# Patient Record
Sex: Female | Born: 1945 | Race: White | Hispanic: No | Smoking: Never smoker
Health system: Southern US, Community
[De-identification: ages and names within clinical notes are randomized; demographics above are authoritative.]

## PROBLEM LIST (undated history)

## (undated) DIAGNOSIS — I10 Essential (primary) hypertension: Secondary | ICD-10-CM

## (undated) DIAGNOSIS — E785 Hyperlipidemia, unspecified: Secondary | ICD-10-CM

## (undated) DIAGNOSIS — M8580 Other specified disorders of bone density and structure, unspecified site: Secondary | ICD-10-CM

## (undated) DIAGNOSIS — K439 Ventral hernia without obstruction or gangrene: Secondary | ICD-10-CM

## (undated) DIAGNOSIS — T7840XA Allergy, unspecified, initial encounter: Secondary | ICD-10-CM

## (undated) HISTORY — DX: Essential (primary) hypertension: I10

## (undated) HISTORY — DX: Ventral hernia without obstruction or gangrene: K43.9

## (undated) HISTORY — DX: Allergy, unspecified, initial encounter: T78.40XA

## (undated) HISTORY — PX: BLADDER SURGERY: SHX569

## (undated) HISTORY — DX: Hyperlipidemia, unspecified: E78.5

## (undated) HISTORY — DX: Other specified disorders of bone density and structure, unspecified site: M85.80

## (undated) HISTORY — PX: HERNIA REPAIR: SHX51

## (undated) HISTORY — PX: ABDOMINAL HYSTERECTOMY: SHX81

---

## 2005-05-06 ENCOUNTER — Ambulatory Visit: Payer: Self-pay

## 2006-05-06 ENCOUNTER — Ambulatory Visit: Payer: Self-pay

## 2013-06-23 DIAGNOSIS — K432 Incisional hernia without obstruction or gangrene: Secondary | ICD-10-CM | POA: Insufficient documentation

## 2014-01-20 ENCOUNTER — Ambulatory Visit: Payer: Self-pay | Admitting: Family Medicine

## 2015-01-25 ENCOUNTER — Ambulatory Visit: Payer: Self-pay | Admitting: Family Medicine

## 2015-02-09 ENCOUNTER — Ambulatory Visit
Admit: 2015-02-09 | Disposition: A | Discharge status: Home / Self Care | Payer: Self-pay | Attending: Family Medicine | Admitting: Family Medicine

## 2015-07-19 ENCOUNTER — Ambulatory Visit (INDEPENDENT_AMBULATORY_CARE_PROVIDER_SITE_OTHER): Payer: Commercial Managed Care - HMO | Admitting: Family Medicine

## 2015-07-19 ENCOUNTER — Encounter: Payer: Self-pay | Admitting: Family Medicine

## 2015-07-19 ENCOUNTER — Other Ambulatory Visit: Payer: Self-pay | Admitting: Family Medicine

## 2015-07-19 VITALS — BP 128/88 | HR 71 | Temp 98.4°F | Resp 16 | Ht 65.0 in | Wt 141.2 lb

## 2015-07-19 DIAGNOSIS — I803 Phlebitis and thrombophlebitis of lower extremities, unspecified: Secondary | ICD-10-CM | POA: Insufficient documentation

## 2015-07-19 DIAGNOSIS — M546 Pain in thoracic spine: Secondary | ICD-10-CM

## 2015-07-19 DIAGNOSIS — Z23 Encounter for immunization: Secondary | ICD-10-CM

## 2015-07-19 DIAGNOSIS — R7303 Prediabetes: Secondary | ICD-10-CM | POA: Insufficient documentation

## 2015-07-19 DIAGNOSIS — Z87448 Personal history of other diseases of urinary system: Secondary | ICD-10-CM | POA: Insufficient documentation

## 2015-07-19 DIAGNOSIS — E7800 Pure hypercholesterolemia, unspecified: Secondary | ICD-10-CM | POA: Insufficient documentation

## 2015-07-19 DIAGNOSIS — R739 Hyperglycemia, unspecified: Secondary | ICD-10-CM

## 2015-07-19 DIAGNOSIS — Z789 Other specified health status: Secondary | ICD-10-CM | POA: Insufficient documentation

## 2015-07-19 DIAGNOSIS — E785 Hyperlipidemia, unspecified: Secondary | ICD-10-CM

## 2015-07-19 DIAGNOSIS — N952 Postmenopausal atrophic vaginitis: Secondary | ICD-10-CM | POA: Insufficient documentation

## 2015-07-19 DIAGNOSIS — R7309 Other abnormal glucose: Secondary | ICD-10-CM

## 2015-07-19 DIAGNOSIS — K439 Ventral hernia without obstruction or gangrene: Secondary | ICD-10-CM | POA: Insufficient documentation

## 2015-07-19 DIAGNOSIS — J302 Other seasonal allergic rhinitis: Secondary | ICD-10-CM | POA: Insufficient documentation

## 2015-07-19 DIAGNOSIS — I82819 Embolism and thrombosis of superficial veins of unspecified lower extremities: Secondary | ICD-10-CM | POA: Insufficient documentation

## 2015-07-19 DIAGNOSIS — Z889 Allergy status to unspecified drugs, medicaments and biological substances status: Secondary | ICD-10-CM

## 2015-07-19 DIAGNOSIS — I10 Essential (primary) hypertension: Secondary | ICD-10-CM | POA: Diagnosis not present

## 2015-07-19 DIAGNOSIS — R928 Other abnormal and inconclusive findings on diagnostic imaging of breast: Secondary | ICD-10-CM | POA: Diagnosis not present

## 2015-07-19 DIAGNOSIS — Z87718 Personal history of other specified (corrected) congenital malformations of genitourinary system: Secondary | ICD-10-CM | POA: Insufficient documentation

## 2015-07-19 HISTORY — DX: Hyperglycemia, unspecified: R73.9

## 2015-07-19 HISTORY — DX: Ventral hernia without obstruction or gangrene: K43.9

## 2015-07-19 MED ORDER — CYCLOBENZAPRINE HCL 5 MG PO TABS: 5.0000 mg | ORAL_TABLET | Freq: Every evening | ORAL | Status: DC | PRN

## 2015-07-19 NOTE — Addendum Note (Signed)
Addended by: Edwena Felty on: 07/19/2015 02:59 PM   Modules accepted: Orders, SmartSet

## 2015-07-19 NOTE — Patient Instructions (Signed)
Fat and Cholesterol Control Diet Fat and cholesterol levels in your blood and organs are influenced by your diet. High levels of fat and cholesterol may lead to diseases of the heart, small and large blood vessels, gallbladder, liver, and pancreas. CONTROLLING FAT AND CHOLESTEROL WITH DIET Although exercise and lifestyle factors are important, your diet is key. That is because certain foods are known to raise cholesterol and others to lower it. The goal is to balance foods for their effect on cholesterol and more importantly, to replace saturated and trans fat with other types of fat, such as monounsaturated fat, polyunsaturated fat, and omega-3 fatty acids. On average, a person should consume no more than 15 to 17 g of saturated fat daily. Saturated and trans fats are considered "bad" fats, and they will raise LDL cholesterol. Saturated fats are primarily found in animal products such as meats, butter, and cream. However, that does not mean you need to give up all your favorite foods. Today, there are good tasting, low-fat, low-cholesterol substitutes for most of the things you like to eat. Choose low-fat or nonfat alternatives. Choose round or loin cuts of red meat. These types of cuts are lowest in fat and cholesterol. Chicken (without the skin), fish, veal, and ground turkey breast are great choices. Eliminate fatty meats, such as hot dogs and salami. Even shellfish have little or no saturated fat. Have a 3 oz (85 g) portion when you eat lean meat, poultry, or fish. Trans fats are also called "partially hydrogenated oils." They are oils that have been scientifically manipulated so that they are solid at room temperature resulting in a longer shelf life and improved taste and texture of foods in which they are added. Trans fats are found in stick margarine, some tub margarines, cookies, crackers, and baked goods.  When baking and cooking, oils are a great substitute for butter. The monounsaturated oils are  especially beneficial since it is believed they lower LDL and raise HDL. The oils you should avoid entirely are saturated tropical oils, such as coconut and palm.  Remember to eat a lot from food groups that are naturally free of saturated and trans fat, including fish, fruit, vegetables, beans, grains (barley, rice, couscous, bulgur wheat), and pasta (without cream sauces).  IDENTIFYING FOODS THAT LOWER FAT AND CHOLESTEROL  Soluble fiber may lower your cholesterol. This type of fiber is found in fruits such as apples, vegetables such as broccoli, potatoes, and carrots, legumes such as beans, peas, and lentils, and grains such as barley. Foods fortified with plant sterols (phytosterol) may also lower cholesterol. You should eat at least 2 g per day of these foods for a cholesterol lowering effect.  Read package labels to identify low-saturated fats, trans fat free, and low-fat foods at the supermarket. Select cheeses that have only 2 to 3 g saturated fat per ounce. Use a heart-healthy tub margarine that is free of trans fats or partially hydrogenated oil. When buying baked goods (cookies, crackers), avoid partially hydrogenated oils. Breads and muffins should be made from whole grains (whole-wheat or whole oat flour, instead of "flour" or "enriched flour"). Buy non-creamy canned soups with reduced salt and no added fats.  FOOD PREPARATION TECHNIQUES  Never deep-fry. If you must fry, either stir-fry, which uses very little fat, or use non-stick cooking sprays. When possible, broil, bake, or roast meats, and steam vegetables. Instead of putting butter or margarine on vegetables, use lemon and herbs, applesauce, and cinnamon (for squash and sweet potatoes). Use nonfat   yogurt, salsa, and low-fat dressings for salads.  LOW-SATURATED FAT / LOW-FAT FOOD SUBSTITUTES Meats / Saturated Fat (g)  Avoid: Steak, marbled (3 oz/85 g) / 11 g  Choose: Steak, lean (3 oz/85 g) / 4 g  Avoid: Hamburger (3 oz/85 g) / 7  g  Choose: Hamburger, lean (3 oz/85 g) / 5 g  Avoid: Ham (3 oz/85 g) / 6 g  Choose: Ham, lean cut (3 oz/85 g) / 2.4 g  Avoid: Chicken, with skin, dark meat (3 oz/85 g) / 4 g  Choose: Chicken, skin removed, dark meat (3 oz/85 g) / 2 g  Avoid: Chicken, with skin, light meat (3 oz/85 g) / 2.5 g  Choose: Chicken, skin removed, light meat (3 oz/85 g) / 1 g Dairy / Saturated Fat (g)  Avoid: Whole milk (1 cup) / 5 g  Choose: Low-fat milk, 2% (1 cup) / 3 g  Choose: Low-fat milk, 1% (1 cup) / 1.5 g  Choose: Skim milk (1 cup) / 0.3 g  Avoid: Hard cheese (1 oz/28 g) / 6 g  Choose: Skim milk cheese (1 oz/28 g) / 2 to 3 g  Avoid: Cottage cheese, 4% fat (1 cup) / 6.5 g  Choose: Low-fat cottage cheese, 1% fat (1 cup) / 1.5 g  Avoid: Ice cream (1 cup) / 9 g  Choose: Sherbet (1 cup) / 2.5 g  Choose: Nonfat frozen yogurt (1 cup) / 0.3 g  Choose: Frozen fruit bar / trace  Avoid: Whipped cream (1 tbs) / 3.5 g  Choose: Nondairy whipped topping (1 tbs) / 1 g Condiments / Saturated Fat (g)  Avoid: Mayonnaise (1 tbs) / 2 g  Choose: Low-fat mayonnaise (1 tbs) / 1 g  Avoid: Butter (1 tbs) / 7 g  Choose: Extra light margarine (1 tbs) / 1 g  Avoid: Coconut oil (1 tbs) / 11.8 g  Choose: Olive oil (1 tbs) / 1.8 g  Choose: Corn oil (1 tbs) / 1.7 g  Choose: Safflower oil (1 tbs) / 1.2 g  Choose: Sunflower oil (1 tbs) / 1.4 g  Choose: Soybean oil (1 tbs) / 2.4 g  Choose: Canola oil (1 tbs) / 1 g Document Released: 10/27/2005 Document Revised: 02/21/2013 Document Reviewed: 01/25/2014 [employer] Patient Information 2015 [employer]. This information is not intended to replace advice given to you by your health care provider. Make sure you discuss any questions you have with your health care provider.  

## 2015-07-19 NOTE — Addendum Note (Signed)
Addended by: Edwena Felty on: 07/19/2015 03:00 PM   Modules accepted: Kipp Brood

## 2015-07-19 NOTE — Progress Notes (Addendum)
Name: Laura Deleon   MRN: [id number]    DOB: 1946/01/28   Date:07/19/2015       Progress Note  Subjective  Chief Complaint  Chief Complaint  Patient presents with  . Hypertension  . Back Pain    patient stated that she has been having some issues with the left side of her upper back near the shoulder blade    HPI  Laura Deleon is a 69 year old female here for routine follow up of chronic medical conditions. Patient here for follow-up of elevated blood pressure. She is not exercising and is adherent to low salt diet.  Blood pressure is well controlled at home. Cardiac symptoms none. Patient denies chest pain, chest pressure/discomfort, claudication, exertional chest pressure/discomfort, irregular heart beat, lower extremity edema and palpitations.  Cardiovascular risk factors: advanced age (older than 58 for men, 76 for women), dyslipidemia, hypertension and sedentary lifestyle. She reports stopping her statin medication pravastatin 10 mg as it was causing her to feel sick with myalgia. She has tried several statins and they all don't agree with her. Use of agents associated with hypertension: estrogens. History of target organ damage: none. Patient complains of arthralgias for which has been present for several weeks. Pain is located in left side of upper back near shoulder blade, is described as aching and dull, and is intermittent .  Associated symptoms include: none.  The patient has tried nothing for pain relief.  Related to injury:   No.      Patient Active Problem List   Diagnosis Date Noted  . Abdominal wall hernia 07/19/2015  . Allergic rhinitis, seasonal 07/19/2015  . History of urinary anomaly 07/19/2015  . Superficial thrombosis of leg 07/19/2015  . Calcium blood increased 07/19/2015  . Blood glucose elevated 07/19/2015  . BP (high blood pressure) 07/19/2015  . Phlebitis of leg 07/19/2015  . Borderline diabetes 07/19/2015  . Hypercholesterolemia without  hypertriglyceridemia 07/19/2015  . 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  . Recurrent ventral incisional hernia 06/23/2013    Social History  Substance Use Topics  . Smoking status: Never Smoker   . Smokeless tobacco: Not on file  . Alcohol Use: No     Current outpatient prescriptions:  .  aspirin EC 81 MG tablet, Take 81 mg by mouth daily., Disp: , Rfl:  .  atenolol (TENORMIN) 50 MG tablet, Take 1 tablet by mouth daily., Disp: , Rfl: 0 .  fluticasone (FLONASE) 50 MCG/ACT nasal spray, Place 2 sprays into the nose daily., Disp: , Rfl:  .  loratadine (CLARITIN) 10 MG tablet, Take 1 tablet by mouth daily., Disp: , Rfl: 2 .  Multiple Vitamin (MULTI-VITAMINS) TABS, Take 1 tablet by mouth daily., Disp: , Rfl:  .  PREMARIN vaginal cream, Apply 1 g topically 2 (two) times a week. vaginally, Disp: , Rfl: 2 .  Red Yeast Rice Extract 600 MG CAPS, Take 600 mg by mouth daily., Disp: , Rfl:   No past surgical history on file.  No family history on file.  Allergies  Allergen Reactions  . Sulfa Antibiotics Shortness Of Breath     Review of Systems  CONSTITUTIONAL: No significant weight changes, fever, chills, weakness or fatigue.  HEENT:  - Eyes: No visual changes.  - Ears: No auditory changes. No pain.  - Nose: No sneezing, congestion, runny nose. - Throat: No sore throat. No changes in swallowing. SKIN: No rash or itching.  CARDIOVASCULAR: No chest pain, chest pressure or chest discomfort. No palpitations or  edema.  RESPIRATORY: No shortness of breath, cough or sputum.  GASTROINTESTINAL: No anorexia, nausea, vomiting. No changes in bowel habits. No abdominal pain or blood.  GENITOURINARY: No dysuria. No frequency. No discharge.  NEUROLOGICAL: No headache, dizziness, syncope, paralysis, ataxia, numbness or tingling in the extremities. No memory changes. No change in bowel or bladder control.  MUSCULOSKELETAL: No joint pain. Back muscle pain. HEMATOLOGIC: No anemia, bleeding or bruising.   LYMPHATICS: No enlarged lymph nodes.  PSYCHIATRIC: No change in mood. No change in sleep pattern.  ENDOCRINOLOGIC: No reports of sweating, cold or heat intolerance. No polyuria or polydipsia.     Objective  BP 128/88 mmHg  Pulse 71  Temp(Src) 98.4 F (36.9 C) (Oral)  Resp 16  Ht 5\' 5"  (1.651 m)  Wt 141 lb 3.2 oz (64.048 kg)  BMI 23.50 kg/m2  SpO2 95% Body mass index is 23.5 kg/(m^2).  Physical Exam  Constitutional: Patient appears well-developed and well-nourished. In no distress.  HEENT:  - Head: Normocephalic and atraumatic.  - Ears: Bilateral TMs gray, no erythema or effusion - Nose: Nasal mucosa moist - Mouth/Throat: Oropharynx is clear and moist. No tonsillar hypertrophy or erythema. No post nasal drainage.  - Eyes: Conjunctivae clear, EOM movements normal. PERRLA. No scleral icterus.  Neck: Normal range of motion. Neck supple. No JVD present. No thyromegaly present.  Cardiovascular: Normal rate, regular rhythm and normal heart sounds.  No murmur heard.  Pulmonary/Chest: Effort normal and breath sounds normal. No respiratory distress. Musculoskeletal: Normal range of motion bilateral UE and LE, no joint effusions. Trapezius muscle tension on the left.  Peripheral vascular: Bilateral LE no edema. Neurological: CN II-XII grossly intact with no focal deficits. Alert and oriented to person, place, and time. Coordination, balance, strength, speech and gait are normal.  Skin: Skin is warm and dry. No rash noted. No erythema.  Psychiatric: Patient has a normal mood and affect. Behavior is normal in office today. Judgment and thought content normal in office today.   Assessment & Plan  1. Left-sided thoracic back pain Home exercises instructed.  - cyclobenzaprine (FLEXERIL) 5 MG tablet; Take 1 tablet (5 mg total) by mouth at bedtime as needed for muscle spasms.  Dispense: 30 tablet; Refill: 1  2. Hypertension goal BP (blood pressure) < 140/90 Clinically stable findings  based on clinical exam and on review of any pertinent results. Recommended to patient that they continue their current regimen with regular follow ups.  - CBC with Differential/Platelet - Comprehensive metabolic panel  3. Borderline diabetes Insurance will no longer pay for HBA1C, will monitor fasting glucose.  4. Hyperlipidemia LDL goal <100 Self discontinued statin. Intolerant to statins. Lifestyle changes, fish oil and red yeast rice.  - Lipid panel  5. Need for immunization against influenza  - Flu Vaccine   6. Statin intolerance   7. Abnormal mammogram of left breast LEFT BREAST will need diagnostic mammogram with magnified views on or after August 11, 2015

## 2015-07-21 LAB — CBC WITH DIFFERENTIAL/PLATELET
BASOS ABS: 0 10*3/uL (ref 0.0–0.2)
Basos: 1 %
EOS (ABSOLUTE): 0.1 10*3/uL (ref 0.0–0.4)
Eos: 2 %
HEMOGLOBIN: 15 g/dL (ref 11.1–15.9)
Hematocrit: 44.4 % (ref 34.0–46.6)
Immature Grans (Abs): 0 10*3/uL (ref 0.0–0.1)
Immature Granulocytes: 0 %
LYMPHS ABS: 2.8 10*3/uL (ref 0.7–3.1)
Lymphs: 36 %
MCH: 29.6 pg (ref 26.6–33.0)
MCHC: 33.8 g/dL (ref 31.5–35.7)
MCV: 88 fL (ref 79–97)
MONOCYTES: 8 %
Monocytes Absolute: 0.6 10*3/uL (ref 0.1–0.9)
Neutrophils Absolute: 4.1 10*3/uL (ref 1.4–7.0)
Neutrophils: 53 %
Platelets: 216 10*3/uL (ref 150–379)
RBC: 5.07 x10E6/uL (ref 3.77–5.28)
RDW: 13.6 % (ref 12.3–15.4)
WBC: 7.7 10*3/uL (ref 3.4–10.8)

## 2015-07-21 LAB — COMPREHENSIVE METABOLIC PANEL
ALK PHOS: 68 IU/L (ref 39–117)
ALT: 37 IU/L — AB (ref 0–32)
AST: 32 IU/L (ref 0–40)
Albumin/Globulin Ratio: 1.4 (ref 1.1–2.5)
Albumin: 4.2 g/dL (ref 3.6–4.8)
BUN/Creatinine Ratio: 13 (ref 11–26)
BUN: 9 mg/dL (ref 8–27)
Bilirubin Total: 0.7 mg/dL (ref 0.0–1.2)
CO2: 27 mmol/L (ref 18–29)
CREATININE: 0.72 mg/dL (ref 0.57–1.00)
Calcium: 9.7 mg/dL (ref 8.7–10.3)
Chloride: 100 mmol/L (ref 97–108)
GFR calc Af Amer: 100 mL/min/{1.73_m2} (ref >59)
GFR calc non Af Amer: 87 mL/min/{1.73_m2} (ref >59)
GLUCOSE: 92 mg/dL (ref 65–99)
Globulin, Total: 2.9 g/dL (ref 1.5–4.5)
Potassium: 4.5 mmol/L (ref 3.5–5.2)
Sodium: 141 mmol/L (ref 134–144)
Total Protein: 7.1 g/dL (ref 6.0–8.5)

## 2015-07-21 LAB — LIPID PANEL
CHOL/HDL RATIO: 5 ratio — AB (ref 0.0–4.4)
Cholesterol, Total: 245 mg/dL — ABNORMAL HIGH (ref 100–199)
HDL: 49 mg/dL (ref >39)
LDL Calculated: 167 mg/dL — ABNORMAL HIGH (ref 0–99)
TRIGLYCERIDES: 143 mg/dL (ref 0–149)
VLDL CHOLESTEROL CAL: 29 mg/dL (ref 5–40)

## 2015-08-13 ENCOUNTER — Telehealth: Payer: Self-pay | Admitting: Family Medicine

## 2015-08-13 ENCOUNTER — Other Ambulatory Visit: Payer: Self-pay | Admitting: Family Medicine

## 2015-08-13 ENCOUNTER — Telehealth: Payer: Self-pay

## 2015-08-13 DIAGNOSIS — R928 Other abnormal and inconclusive findings on diagnostic imaging of breast: Secondary | ICD-10-CM

## 2015-08-13 NOTE — Telephone Encounter (Signed)
Tried to contact this patient to inform her that additional testing is needed and that she should call [facility] at [phone] to scheduled an appt, but there was no answer. A message was left informing her that additional testing was needed and to wither give [place] a call or [facility].

## 2015-08-13 NOTE — Telephone Encounter (Signed)
Patient returned my call from earlier and after she verified her date of birth, Dr. Debby Freiberg message regarding her abnormal mammogram was reviewed. Patient was given [facility]'s number and was told to schedule her a f/u appt for additional testing.   Also while on the phone results from blood work was reviewed and a copy was mailed to this patient's home.

## 2015-08-13 NOTE — Telephone Encounter (Signed)
Please let the patient know that I have ordered additional detailed mammogram studies of her Left breast as recommended by her abnormal mammogram results from 02/2015. Please provide her with [facility] number to call and schedule.

## 2015-08-22 ENCOUNTER — Other Ambulatory Visit: Payer: Self-pay | Admitting: Family Medicine

## 2015-09-05 ENCOUNTER — Telehealth: Payer: Self-pay | Admitting: Family Medicine

## 2015-09-05 NOTE — Telephone Encounter (Signed)
Patient was already reminded that additional breast imaging ordered on 08/13/15 however I have not received results to this day therefore I am concerned she has not made appointment to proceed with recommended repeat imaging.  Please speak with patient and maybe make appointment for her with breast center if she allows us to help get this testing done.

## 2015-09-06 NOTE — Telephone Encounter (Signed)
Tried to contact this patient to review Dr. Debby FreibergSundaram's message, but there was no answer. A message was left for this patient to give us a call when she got the chance.

## 2015-09-26 ENCOUNTER — Other Ambulatory Visit: Payer: Self-pay | Admitting: Family Medicine

## 2015-10-02 ENCOUNTER — Other Ambulatory Visit: Payer: Self-pay | Admitting: Family Medicine

## 2015-10-02 ENCOUNTER — Ambulatory Visit
Admission: RE | Admit: 2015-10-02 | Discharge: 2015-10-02 | Disposition: A | Discharge status: Home / Self Care | Payer: Commercial Managed Care - HMO | Source: Ambulatory Visit | Attending: Family Medicine | Admitting: Family Medicine

## 2015-10-02 DIAGNOSIS — R928 Other abnormal and inconclusive findings on diagnostic imaging of breast: Secondary | ICD-10-CM | POA: Diagnosis present

## 2015-10-02 DIAGNOSIS — R921 Mammographic calcification found on diagnostic imaging of breast: Secondary | ICD-10-CM | POA: Diagnosis not present

## 2015-10-12 ENCOUNTER — Encounter: Payer: Self-pay | Admitting: Family Medicine

## 2015-10-12 ENCOUNTER — Ambulatory Visit
Admission: RE | Admit: 2015-10-12 | Discharge: 2015-10-12 | Disposition: A | Discharge status: Home / Self Care | Payer: Commercial Managed Care - HMO | Source: Ambulatory Visit | Attending: Family Medicine | Admitting: Family Medicine

## 2015-10-12 ENCOUNTER — Ambulatory Visit (INDEPENDENT_AMBULATORY_CARE_PROVIDER_SITE_OTHER): Payer: Commercial Managed Care - HMO | Admitting: Family Medicine

## 2015-10-12 VITALS — BP 132/80 | HR 64 | Temp 98.4°F | Resp 12 | Ht 65.0 in | Wt 141.0 lb

## 2015-10-12 DIAGNOSIS — R079 Chest pain, unspecified: Secondary | ICD-10-CM | POA: Diagnosis present

## 2015-10-12 DIAGNOSIS — R0789 Other chest pain: Secondary | ICD-10-CM | POA: Insufficient documentation

## 2015-10-12 NOTE — Progress Notes (Signed)
Name: Laura MoseLinda W Deleon   MRN: [id number]    DOB: 30-Jan-1946   Date:10/12/2015       Progress Note  Subjective  Chief Complaint  Chief Complaint  Patient presents with  . Chest Pain    HPI  Laura BridegroomLinda Eickhoff is a 69 year old female with left sided chest wall pain since October of 2016. Initially thought to be breast pain. Had screening and diagnostic mammogram, stable findings. Pain described as achy/sharp and comes and goes through out the day time only. Initially thought to be due to some yard work done early in October. Using muscle relaxer for musculoskeletal pain which helps a bit but symptoms are still persistent. Does not wake her up at night. Not associated with SOB, cough, unwanted weight loss, fevers. Reports being very stressed out (husband ill and difficult personality, other family stuff). Pain not worse with deep breathing.   Past Medical History  Diagnosis Date  . Allergy   . Hyperlipidemia     Patient Active Problem List   Diagnosis Date Noted  . Pain in the chest 10/12/2015  . Abdominal wall hernia 07/19/2015  . Allergic rhinitis, seasonal 07/19/2015  . History of urinary anomaly 07/19/2015  . Superficial thrombosis of leg 07/19/2015  . Calcium blood increased 07/19/2015  . Blood glucose elevated 07/19/2015  . Hypertension goal BP (blood pressure) < 140/90 07/19/2015  . Phlebitis of leg 07/19/2015  . Borderline diabetes 07/19/2015  . Hypercholesterolemia without hypertriglyceridemia 07/19/2015  . 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  . Left-sided thoracic back pain 07/19/2015  . Hyperlipidemia LDL goal <100 07/19/2015  . Need for immunization against influenza 07/19/2015  . Abnormal mammogram of left breast 07/19/2015  . Statin intolerance 07/19/2015  . Recurrent ventral incisional hernia 06/23/2013    Social History  Substance Use Topics  . Smoking status: Never Smoker   . Smokeless tobacco: Not on file  . Alcohol Use: No     Current outpatient prescriptions:  .   aspirin EC 81 MG tablet, Take 81 mg by mouth daily., Disp: , Rfl:  .  atenolol (TENORMIN) 50 MG tablet, TAKE 1 TABLET BY MOUTH DAILY., Disp: 90 tablet, Rfl: 3 .  cyclobenzaprine (FLEXERIL) 5 MG tablet, Take 1 tablet (5 mg total) by mouth at bedtime as needed for muscle spasms., Disp: 30 tablet, Rfl: 1 .  fluticasone (FLONASE) 50 MCG/ACT nasal spray, Place 2 sprays into the nose daily., Disp: , Rfl:  .  loratadine (CLARITIN) 10 MG tablet, TAKE 1 TABLET BY MOUTH EVERY DAY, Disp: 90 tablet, Rfl: 3 .  Multiple Vitamin (MULTI-VITAMINS) TABS, Take 1 tablet by mouth daily., Disp: , Rfl:  .  PREMARIN vaginal cream, Apply 1 g topically 2 (two) times a week. vaginally, Disp: , Rfl: 2 .  Red Yeast Rice Extract 600 MG CAPS, Take 600 mg by mouth daily., Disp: , Rfl:   Past Surgical History  Procedure Laterality Date  . Abdominal hysterectomy      Family History  Problem Relation Age of Onset  . Breast cancer Neg Hx   . Hyperlipidemia Mother   . Hypertension Mother   . Hyperlipidemia Father     Allergies  Allergen Reactions  . Sulfa Antibiotics Shortness Of Breath     Review of Systems  CONSTITUTIONAL: No significant weight changes, fever, chills, weakness or fatigue.  CARDIOVASCULAR: Yes chest pain. No chest pressure or chest discomfort. No palpitations or edema.  RESPIRATORY: No shortness of breath, cough or sputum.  NEUROLOGICAL: No headache, dizziness,  syncope, paralysis, ataxia, numbness or tingling in the extremities. No memory changes. No change in bowel or bladder control.  MUSCULOSKELETAL: Chronic joint pain. No muscle pain. PSYCHIATRIC: No change in mood. No change in sleep pattern.  ENDOCRINOLOGIC: No reports of sweating, cold or heat intolerance. No polyuria or polydipsia.     Objective  BP 132/80 mmHg  Pulse 64  Temp(Src) 98.4 F (36.9 C) (Oral)  Resp 12  Ht  (1.651 m)  Wt 141 lb (63.957 kg)  BMI 23.46 kg/m2  SpO2 94% Body mass index is 23.46  kg/(m^2).  Physical Exam  Constitutional: Patient appears well-developed and well-nourished. In no distress.  Neck: Normal range of motion. Neck supple. No JVD present. No thyromegaly present.  Cardiovascular: Normal rate, regular rhythm and normal heart sounds.  No murmur heard. Left of the sternal border reproducible pain on palpation.  Pulmonary/Chest: Effort normal and breath sounds normal. No respiratory distress. Musculoskeletal: Normal range of motion bilateral UE and LE, no joint effusions. Peripheral vascular: Bilateral LE no edema. Neurological: CN II-XII grossly intact with no focal deficits. Alert and oriented to person, place, and time. Coordination, balance, strength, speech and gait are normal.  Skin: Skin is warm and dry. No rash noted. No erythema.  Psychiatric: Patient has a normal mood and affect. Behavior is normal in office today. Judgment and thought content normal in office today.   Recent Results (from the past 2160 hour(s))  CBC with Differential/Platelet     Status: None   Collection Time: 07/20/15 12:57 PM  Result Value Ref Range   WBC 7.7 3.4 - 10.8 x10E3/uL   RBC 5.07 3.77 - 5.28 x10E6/uL   Hemoglobin 15.0 11.1 - 15.9 g/dL   Hematocrit 40.9 81.1 - 46.6 %   MCV 88 79 - 97 fL   MCH 29.6 26.6 - 33.0 pg   MCHC 33.8 31.5 - 35.7 g/dL   RDW 91.4 78.2 - 95.6 %   Platelets 216 150 - 379 x10E3/uL   Neutrophils 53 %   Lymphs 36 %   Monocytes 8 %   Eos 2 %   Basos 1 %   Neutrophils Absolute 4.1 1.4 - 7.0 x10E3/uL   Lymphocytes Absolute 2.8 0.7 - 3.1 x10E3/uL   Monocytes Absolute 0.6 0.1 - 0.9 x10E3/uL   EOS (ABSOLUTE) 0.1 0.0 - 0.4 x10E3/uL   Basophils Absolute 0.0 0.0 - 0.2 x10E3/uL   Immature Granulocytes 0 %   Immature Grans (Abs) 0.0 0.0 - 0.1 x10E3/uL  Comprehensive metabolic panel     Status: Abnormal   Collection Time: 07/20/15 12:57 PM  Result Value Ref Range   Glucose 92 65 - 99 mg/dL   BUN 9 8 - 27 mg/dL   Creatinine, Ser 2.13 0.57 - 1.00 mg/dL    GFR calc non Af Amer 87 >59 mL/min/1.73   GFR calc Af Amer 100 >59 mL/min/1.73   BUN/Creatinine Ratio 13 11 - 26   Sodium 141 134 - 144 mmol/L   Potassium 4.5 3.5 - 5.2 mmol/L   Chloride 100 97 - 108 mmol/L   CO2 27 18 - 29 mmol/L   Calcium 9.7 8.7 - 10.3 mg/dL   Total Protein 7.1 6.0 - 8.5 g/dL   Albumin 4.2 3.6 - 4.8 g/dL   Globulin, Total 2.9 1.5 - 4.5 g/dL   Albumin/Globulin Ratio 1.4 1.1 - 2.5   Bilirubin Total 0.7 0.0 - 1.2 mg/dL   Alkaline Phosphatase 68 39 - 117 IU/L   AST 32 0 -  40 IU/L   ALT 37 (H) 0 - 32 IU/L  Lipid panel     Status: Abnormal   Collection Time: 07/20/15 12:57 PM  Result Value Ref Range   Cholesterol, Total 245 (H) 100 - 199 mg/dL   Triglycerides 161 0 - 149 mg/dL   HDL 49 >09 mg/dL    Comment: According to ATP-III Guidelines, HDL-C >59 mg/dL is considered a negative risk factor for CHD.    VLDL Cholesterol Cal 29 5 - 40 mg/dL   LDL Calculated 604 (H) 0 - 99 mg/dL   Chol/HDL Ratio 5.0 (H) 0.0 - 4.4 ratio units    Comment:                                   T. Chol/HDL Ratio                                             Men  Women                               1/2 Avg.Risk  3.4    3.3                                   Avg.Risk  5.0    4.4                                2X Avg.Risk  9.6    7.1                                3X Avg.Risk 23.4   11.0      Assessment & Plan  1. Chest pain, unspecified chest pain type Atypical chest wall pain although I can not rule out possible cardiac etiology given her HLD and intolerance to statin therapy. EKG no acute findings or ST segment changes. Will get troponin, D-dimer (PE less likely) and CXR. Plan on Cardiology consultation for stress testing after I review results of today's orders.  - D-Dimer, Quantitative - Troponin I - DG Chest 2 View; Future - EKG 12-Lead

## 2015-10-12 NOTE — Patient Instructions (Signed)
1) Lab work 2) CXR

## 2015-10-13 LAB — TROPONIN I

## 2015-10-13 LAB — D-DIMER, QUANTITATIVE: D-DIMER: 0.45 mg/L FEU (ref 0.00–0.49)

## 2015-10-15 ENCOUNTER — Other Ambulatory Visit: Payer: Self-pay | Admitting: Family Medicine

## 2015-10-15 DIAGNOSIS — R0789 Other chest pain: Secondary | ICD-10-CM

## 2015-11-13 DIAGNOSIS — R011 Cardiac murmur, unspecified: Secondary | ICD-10-CM | POA: Diagnosis not present

## 2015-11-13 DIAGNOSIS — R0602 Shortness of breath: Secondary | ICD-10-CM | POA: Diagnosis not present

## 2015-11-13 DIAGNOSIS — R5383 Other fatigue: Secondary | ICD-10-CM | POA: Diagnosis not present

## 2015-11-13 DIAGNOSIS — I208 Other forms of angina pectoris: Secondary | ICD-10-CM | POA: Diagnosis not present

## 2015-11-13 DIAGNOSIS — R7303 Prediabetes: Secondary | ICD-10-CM | POA: Diagnosis not present

## 2015-11-13 DIAGNOSIS — I10 Essential (primary) hypertension: Secondary | ICD-10-CM | POA: Diagnosis not present

## 2015-11-18 ENCOUNTER — Other Ambulatory Visit: Payer: Self-pay | Admitting: Family Medicine

## 2015-12-20 ENCOUNTER — Telehealth: Payer: Self-pay | Admitting: Family Medicine

## 2015-12-20 NOTE — Telephone Encounter (Signed)
Pt would like a order put in for her to get a mammogram.

## 2016-01-05 ENCOUNTER — Other Ambulatory Visit: Payer: Self-pay | Admitting: Family Medicine

## 2016-01-08 ENCOUNTER — Encounter: Payer: Self-pay | Admitting: Family Medicine

## 2016-01-08 ENCOUNTER — Ambulatory Visit (INDEPENDENT_AMBULATORY_CARE_PROVIDER_SITE_OTHER): Payer: PPO | Admitting: Family Medicine

## 2016-01-08 VITALS — BP 132/78 | HR 76 | Temp 98.8°F | Resp 16 | Ht 65.0 in | Wt 143.3 lb

## 2016-01-08 DIAGNOSIS — E785 Hyperlipidemia, unspecified: Secondary | ICD-10-CM

## 2016-01-08 DIAGNOSIS — K439 Ventral hernia without obstruction or gangrene: Secondary | ICD-10-CM | POA: Diagnosis not present

## 2016-01-08 DIAGNOSIS — R928 Other abnormal and inconclusive findings on diagnostic imaging of breast: Secondary | ICD-10-CM

## 2016-01-08 DIAGNOSIS — Z889 Allergy status to unspecified drugs, medicaments and biological substances status: Secondary | ICD-10-CM

## 2016-01-08 DIAGNOSIS — Z789 Other specified health status: Secondary | ICD-10-CM

## 2016-01-08 DIAGNOSIS — I10 Essential (primary) hypertension: Secondary | ICD-10-CM | POA: Diagnosis not present

## 2016-01-08 DIAGNOSIS — N952 Postmenopausal atrophic vaginitis: Secondary | ICD-10-CM | POA: Diagnosis not present

## 2016-01-08 MED ORDER — ESTROGENS, CONJUGATED 0.625 MG/GM VA CREA: TOPICAL_CREAM | VAGINAL | Status: DC

## 2016-01-08 NOTE — Progress Notes (Signed)
Name: Laura Deleon   MRN: [id number]    DOB: April 12, 1946   Date:01/08/2016       Progress Note  Subjective  Chief Complaint  Chief Complaint  Patient presents with  . Medication Refill  . Menopause    Patient need refill on premarin.  Marland Kitchen Referral    mammagram    HPI  Laura Deleon is a 70 year old female here for routine follow up of chronic medical conditions. Patient here for follow-up of elevated blood pressure. She is not exercising and is adherent to low salt diet. Blood pressure is well controlled at home. Cardiac symptoms none. Patient denies chest pain, chest pressure/discomfort, claudication, exertional chest pressure/discomfort, irregular heart beat, lower extremity edema and palpitations. Cardiovascular risk factors: advanced age (older than 57 for men, 73 for women), dyslipidemia, hypertension and sedentary lifestyle. She reports stopping her statin medication pravastatin 10 mg as it was causing her to feel sick with myalgia. She has tried several statins and they all don't agree with her. Use of agents associated with hypertension: estrogens. History of target organ damage: none.   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 Over the years she notes slowly increasing bulging and distention of the same area w/o chronic or acute pain. Would like to re consult with [facility] where she had the original repair.   Post menopausal symptoms and vaginal dryness well controled on premarin cream 2x/week and she is requesting refill today. Otherwise needs diagnostic mammogram and [place] ordered to follow previous abnormal findings.   Past Medical History  Diagnosis Date  . Allergy   . Hyperlipidemia   . Hypertension   . Hernia of anterior abdominal wall     Patient Active Problem List   Diagnosis Date Noted  . Atypical chest pain 10/12/2015  . Abdominal wall hernia 07/19/2015  . Allergic rhinitis, seasonal 07/19/2015  . History of urinary  anomaly 07/19/2015  . Superficial thrombosis of leg 07/19/2015  . Calcium blood increased 07/19/2015  . Blood glucose elevated 07/19/2015  . Hypertension goal BP (blood pressure) < 140/90 07/19/2015  . Phlebitis of leg 07/19/2015  . Borderline diabetes 07/19/2015  . Hypercholesterolemia without hypertriglyceridemia 07/19/2015  . 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  . Left-sided thoracic back pain 07/19/2015  . Hyperlipidemia LDL goal <100 07/19/2015  . Need for immunization against influenza 07/19/2015  . Abnormal mammogram of left breast 07/19/2015  . Statin intolerance 07/19/2015  . Recurrent ventral incisional hernia 06/23/2013    Social History  Substance Use Topics  . Smoking status: Never Smoker   . Smokeless tobacco: Not on file  . Alcohol Use: No     Current outpatient prescriptions:  .  aspirin EC 81 MG tablet, Take 81 mg by mouth daily., Disp: , Rfl:  .  atenolol (TENORMIN) 50 MG tablet, TAKE 1 TABLET BY MOUTH DAILY., Disp: 90 tablet, Rfl: 3 .  cyclobenzaprine (FLEXERIL) 5 MG tablet, TAKE 1 TABLET(5 MG) BY MOUTH AT BEDTIME AS NEEDED FOR MUSCLE SPASMS, Disp: 30 tablet, Rfl: 3 .  fluticasone (FLONASE) 50 MCG/ACT nasal spray, Place 2 sprays into the nose daily., Disp: , Rfl:  .  loratadine (CLARITIN) 10 MG tablet, TAKE 1 TABLET BY MOUTH EVERY DAY, Disp: 90 tablet, Rfl: 3 .  Multiple Vitamin (MULTI-VITAMINS) TABS, Take 1 tablet by mouth daily., Disp: , Rfl:  .  PREMARIN vaginal cream, APPLY 1 GRAM TO AFFECTED VAGINAL AREA TWICE A WEEK AS DIRECTED, Disp: 30 g,  Rfl: 0 .  Red Yeast Rice Extract 600 MG CAPS, Take 600 mg by mouth daily., Disp: , Rfl:   Past Surgical History  Procedure Laterality Date  . Abdominal hysterectomy      Family History  Problem Relation Age of Onset  . Breast cancer Neg Hx   . Hyperlipidemia Mother   . Hypertension Mother   . Hyperlipidemia Father     Allergies  Allergen Reactions  . Sulfa Antibiotics Shortness Of Breath     Review of  Systems  CONSTITUTIONAL: No significant weight changes, fever, chills, weakness or fatigue.  HEENT:  - Eyes: No visual changes.  - Ears: No auditory changes. No pain.  - Nose: No sneezing, congestion, runny nose. - Throat: No sore throat. No changes in swallowing. SKIN: No rash or itching.  CARDIOVASCULAR: No chest pain, chest pressure or chest discomfort. No palpitations or edema.  RESPIRATORY: No shortness of breath, cough or sputum.  GASTROINTESTINAL: No anorexia, nausea, vomiting. No changes in bowel habits. No abdominal pain or blood. Yes hernia.  GENITOURINARY: No dysuria. No frequency. No discharge.  NEUROLOGICAL: No headache, dizziness, syncope, paralysis, ataxia, numbness or tingling in the extremities. No memory changes. No change in bowel or bladder control.  MUSCULOSKELETAL: No joint pain. No muscle pain. HEMATOLOGIC: No anemia, bleeding or bruising.  LYMPHATICS: No enlarged lymph nodes.  PSYCHIATRIC: No change in mood. No change in sleep pattern.  ENDOCRINOLOGIC: No reports of sweating, cold or heat intolerance. No polyuria or polydipsia.     Objective  BP 132/78 mmHg  Pulse 76  Temp(Src) 98.8 F (37.1 C) (Oral)  Resp 16  Ht  (1.651 m)  Wt 143 lb 4.8 oz (65 kg)  BMI 23.85 kg/m2  SpO2 94% Body mass index is 23.85 kg/(m^2).  Physical Exam  Constitutional: Patient appears well-developed and well-nourished. In no distress.  HEENT:  - Head: Normocephalic and atraumatic.  - Ears: Bilateral TMs gray, no erythema or effusion - Nose: Nasal mucosa moist - Mouth/Throat: Oropharynx is clear and moist. No tonsillar hypertrophy or erythema. No post nasal drainage. Wearing partial dentures.  - Eyes: Conjunctivae clear, EOM movements normal. PERRLA. No scleral icterus.  Neck: Normal range of motion. Neck supple. No JVD present. No thyromegaly present.  Cardiovascular: Normal rate, regular rhythm and normal heart sounds.  No murmur heard.  Pulmonary/Chest: Effort normal  and breath sounds normal. No respiratory distress.  Abdomen: Soft, non tender, no guarding, normal BS. Large bulge LLQ without signs of incarceration of bowels.  Breast: Bilateral breasts normal with no skin dimpling, masses, nipple discharge or inversions. Scattered seborrheic keratosis lesions.   Musculoskeletal: Normal range of motion bilateral UE and LE, no joint effusions. Peripheral vascular: Bilateral LE no edema. Neurological: CN II-XII grossly intact with no focal deficits. Alert and oriented to person, place, and time. Coordination, balance, strength, speech and gait are normal.  Skin: Skin is warm and dry. No rash noted. No erythema.  Psychiatric: Patient has a normal mood and affect. Behavior is normal in office today. Judgment and thought content normal in office today.     Assessment & Plan   1. Abnormal screening mammogram Ordered additional testing.  - MM Digital Diagnostic Bilat; Future - US BREAST LTD UNI LEFT INC AXILLA; Future - US BREAST LTD UNI RIGHT INC AXILLA; Future  2. Hypertension goal BP (blood pressure) < 140/90 Well controled.  3. Atrophy of vagina Refilled.  - conjugated estrogens (PREMARIN) vaginal cream; Place vaginally 2 (two) times a  week.  Dispense: 30 g; Refill: 1  4. Hyperlipidemia LDL goal <100 Intolerant to statins.   5. Statin intolerance Using red yeast rice extract.   6. Abdominal wall hernia Referred to [facility] per her request.  Address: [facility] [place], [id number], [place], [place] [address]  Phone: [phone]  - Ambulatory referral to General Surgery

## 2016-01-24 ENCOUNTER — Telehealth: Payer: Self-pay

## 2016-01-24 NOTE — Telephone Encounter (Signed)
[facility] called they were told to contact you once they had an appt. Scheduled for this patient.  It is scheduled for May 1 for her hernia.

## 2016-01-30 ENCOUNTER — Other Ambulatory Visit: Payer: Self-pay

## 2016-01-30 ENCOUNTER — Ambulatory Visit: Payer: Commercial Managed Care - HMO

## 2016-02-14 ENCOUNTER — Other Ambulatory Visit: Payer: Self-pay | Admitting: Family Medicine

## 2016-02-14 ENCOUNTER — Ambulatory Visit
Admission: RE | Admit: 2016-02-14 | Discharge: 2016-02-14 | Disposition: A | Discharge status: Home / Self Care | Payer: PPO | Source: Ambulatory Visit | Attending: Family Medicine | Admitting: Family Medicine

## 2016-02-14 DIAGNOSIS — R928 Other abnormal and inconclusive findings on diagnostic imaging of breast: Secondary | ICD-10-CM | POA: Diagnosis not present

## 2016-02-14 DIAGNOSIS — R921 Mammographic calcification found on diagnostic imaging of breast: Secondary | ICD-10-CM | POA: Insufficient documentation

## 2016-08-19 ENCOUNTER — Other Ambulatory Visit: Payer: Self-pay | Admitting: Family Medicine

## 2016-08-19 ENCOUNTER — Ambulatory Visit (INDEPENDENT_AMBULATORY_CARE_PROVIDER_SITE_OTHER): Payer: PPO | Admitting: Family Medicine

## 2016-08-19 ENCOUNTER — Encounter: Payer: Self-pay | Admitting: Family Medicine

## 2016-08-19 VITALS — BP 138/82 | HR 62 | Temp 98.0°F | Resp 16 | Wt 125.4 lb

## 2016-08-19 DIAGNOSIS — Z789 Other specified health status: Secondary | ICD-10-CM

## 2016-08-19 DIAGNOSIS — R7303 Prediabetes: Secondary | ICD-10-CM

## 2016-08-19 DIAGNOSIS — Z23 Encounter for immunization: Secondary | ICD-10-CM

## 2016-08-19 DIAGNOSIS — Z5181 Encounter for therapeutic drug level monitoring: Secondary | ICD-10-CM

## 2016-08-19 DIAGNOSIS — I10 Essential (primary) hypertension: Secondary | ICD-10-CM

## 2016-08-19 DIAGNOSIS — R634 Abnormal weight loss: Secondary | ICD-10-CM | POA: Insufficient documentation

## 2016-08-19 DIAGNOSIS — E785 Hyperlipidemia, unspecified: Secondary | ICD-10-CM

## 2016-08-19 DIAGNOSIS — J301 Allergic rhinitis due to pollen: Secondary | ICD-10-CM | POA: Diagnosis not present

## 2016-08-19 DIAGNOSIS — R928 Other abnormal and inconclusive findings on diagnostic imaging of breast: Secondary | ICD-10-CM

## 2016-08-19 DIAGNOSIS — Z7189 Other specified counseling: Secondary | ICD-10-CM | POA: Insufficient documentation

## 2016-08-19 MED ORDER — LORATADINE 10 MG PO TABS: 10.0000 mg | ORAL_TABLET | Freq: Every day | ORAL | 11 refills | Status: DC | PRN

## 2016-08-19 MED ORDER — FLUTICASONE PROPIONATE 50 MCG/ACT NA SUSP: 2.0000 | Freq: Every day | NASAL | 11 refills | Status: DC

## 2016-08-19 NOTE — Assessment & Plan Note (Signed)
Check fasting lipids 

## 2016-08-19 NOTE — Assessment & Plan Note (Signed)
Controlled, continue beta-blocker; DASH guidelines

## 2016-08-19 NOTE — Assessment & Plan Note (Signed)
Start back on plain claritin and nasal spray; avoid triggers

## 2016-08-19 NOTE — Patient Instructions (Addendum)
Start back on the plain claritin and nasal spray Try to use PLAIN allergy medicine without the decongestant Avoid: phenylephrine, phenylpropanolamine, and pseudoephredine  Try to follow the DASH guidelines (DASH stands for Dietary Approaches to Stop Hypertension) Try to limit the sodium in your diet.  Ideally, consume less than 1.5 grams (less than 1,500mg ) per day. Do not add salt when cooking or at the table.  Check the sodium amount on labels when shopping, and choose items lower in sodium when given a choice. Avoid or limit foods that already contain a lot of sodium. Eat a diet rich in fruits and vegetables and whole grains. Do not drink pickle juice Try 4 ounces of tonic water for cramps  Return in the next week for fasting labs We'll get a mammogram in late November If you have not heard anything from my staff in a week about any orders/referrals/studies from today, please contact us here to follow-up [phone]  Cholesterol Cholesterol is a fat. Your body needs a small amount of cholesterol. Cholesterol may build up in your blood vessels. This increases your chance of having a heart attack or stroke. You cannot feel your cholesterol levels. The only way to know your cholesterol level is high is with a blood test. Keep your test results. Work with your doctor to keep your cholesterol at a good level. WHAT DO THE TEST RESULTS MEAN?  Total cholesterol is how much cholesterol is in your blood.  LDL is bad cholesterol. This is the type that can build up. You want LDL to be low.  HDL is good cholesterol. It cleans your blood vessels and carries LDL away. You want HDL to be high.  Triglycerides are fat that the body can burn for energy or store. WHAT ARE GOOD LEVELS OF CHOLESTEROL?  Total cholesterol below 200.  LDL below 100 for people at risk. Below 70 for those at very high risk.  HDL above 50 is good. Above 60 is best.  Triglycerides below 150. HOW CAN I LOWER MY  CHOLESTEROL?  Diet. Follow your diet programs as told by your doctor.  Choose fish, white meat chicken, roasted [place], or baked [place]. Try not to eat red meat, fried foods, or processed meats such as sausage and lunch meats.  Eat lots of fresh fruits and vegetables.  Choose whole grains, beans, pasta, potatoes, and cereals.  Use only small amounts of olive, corn, or canola oils.  Try not to eat butter, mayonnaise, shortening, or palm kernel oils.  Try not to eat foods with trans fats.  Drink skim or nonfat milk. Eat low-fat or nonfat yogurt and cheeses. Try not to drink whole milk or cream. Try not to eat ice cream, egg yolks, and full-fat cheeses.  Healthy desserts include angel food cake, ginger snaps, animal crackers, hard candy, popsicles, and low-fat or nonfat frozen yogurt. Try not to eat pastries, cakes, pies, and cookies.  Exercise. Follow your exercise programs as told by your doctor.  Be more active. You can try gardening, walking, or taking the stairs. Ask your doctor about how you can be more active.  Medicine. Take medicine as told by your doctor.   This information is not intended to replace advice given to you by your health care provider. Make sure you discuss any questions you have with your health care provider.   Document Released: 01/23/2009 Document Revised: 11/17/2014 Document Reviewed: 08/10/2013 Elsevier Interactive Patient Education 2016 Elsevier Inc[employer] DASH Eating Plan DASH stands for "Dietary Approaches to  Stop Hypertension." The DASH eating plan is a healthy eating plan that has been shown to reduce high blood pressure (hypertension). Additional health benefits may include reducing the risk of type 2 diabetes mellitus, heart disease, and stroke. The DASH eating plan may also help with weight loss. WHAT DO I NEED TO KNOW ABOUT THE DASH EATING PLAN? For the DASH eating plan, you will follow these general guidelines:  Choose foods with a percent daily  value for sodium of less than 5% (as listed on the food label).  Use salt-free seasonings or herbs instead of table salt or sea salt.  Check with your health care provider or pharmacist before using salt substitutes.  Eat lower-sodium products, often labeled as "lower sodium" or "no salt added."  Eat fresh foods.  Eat more vegetables, fruits, and low-fat dairy products.  Choose whole grains. Look for the word "whole" as the first word in the ingredient list.  Choose fish and skinless chicken or [place] more often than red meat. Limit fish, poultry, and meat to 6 oz (170 g) each day.  Limit sweets, desserts, sugars, and sugary drinks.  Choose heart-healthy fats.  Limit cheese to 1 oz (28 g) per day.  Eat more home-cooked food and less restaurant, buffet, and fast food.  Limit fried foods.  Cook foods using methods other than frying.  Limit canned vegetables. If you do use them, rinse them well to decrease the sodium.  When eating at a restaurant, ask that your food be prepared with less salt, or no salt if possible. WHAT FOODS CAN I EAT? Seek help from a dietitian for individual calorie needs. Grains Whole grain or whole wheat bread. Brown rice. Whole grain or whole wheat pasta. Quinoa, bulgur, and whole grain cereals. Low-sodium cereals. Corn or whole wheat flour tortillas. Whole grain cornbread. Whole grain crackers. Low-sodium crackers. Vegetables Fresh or frozen vegetables (raw, steamed, roasted, or grilled). Low-sodium or reduced-sodium tomato and vegetable juices. Low-sodium or reduced-sodium tomato sauce and paste. Low-sodium or reduced-sodium canned vegetables.  Fruits All fresh, canned (in natural juice), or frozen fruits. Meat and Other Protein Products Ground beef (85% or leaner), grass-fed beef, or beef trimmed of fat. Skinless chicken or [place]. Ground chicken or [place]. Pork trimmed of fat. All fish and seafood. Eggs. Dried beans, peas, or lentils. Unsalted nuts  and seeds. Unsalted canned beans. Dairy Low-fat dairy products, such as skim or 1% milk, 2% or reduced-fat cheeses, low-fat ricotta or cottage cheese, or plain low-fat yogurt. Low-sodium or reduced-sodium cheeses. Fats and Oils Tub margarines without trans fats. Light or reduced-fat mayonnaise and salad dressings (reduced sodium). Avocado. Safflower, olive, or canola oils. Natural peanut or almond butter. Other Unsalted popcorn and pretzels. The items listed above may not be a complete list of recommended foods or beverages. Contact your dietitian for more options. WHAT FOODS ARE NOT RECOMMENDED? Grains White bread. White pasta. White rice. Refined cornbread. Bagels and croissants. Crackers that contain trans fat. Vegetables Creamed or fried vegetables. Vegetables in a cheese sauce. Regular canned vegetables. Regular canned tomato sauce and paste. Regular tomato and vegetable juices. Fruits Dried fruits. Canned fruit in light or heavy syrup. Fruit juice. Meat and Other Protein Products Fatty cuts of meat. Ribs, chicken wings, bacon, sausage, bologna, salami, chitterlings, fatback, hot dogs, bratwurst, and packaged luncheon meats. Salted nuts and seeds. Canned beans with salt. Dairy Whole or 2% milk, cream, half-and-half, and cream cheese. Whole-fat or sweetened yogurt. Full-fat cheeses or blue cheese. Nondairy creamers and whipped  toppings. Processed cheese, cheese spreads, or cheese curds. Condiments Onion and garlic salt, seasoned salt, table salt, and sea salt. Canned and packaged gravies. Worcestershire sauce. Tartar sauce. Barbecue sauce. Teriyaki sauce. Soy sauce, including reduced sodium. Steak sauce. Fish sauce. Oyster sauce. Cocktail sauce. Horseradish. Ketchup and mustard. Meat flavorings and tenderizers. Bouillon cubes. Hot sauce. Tabasco sauce. Marinades. Taco seasonings. Relishes. Fats and Oils Butter, stick margarine, lard, shortening, ghee, and bacon fat. Coconut, palm kernel, or  palm oils. Regular salad dressings. Other Pickles and olives. Salted popcorn and pretzels. The items listed above may not be a complete list of foods and beverages to avoid. Contact your dietitian for more information. WHERE CAN I FIND MORE INFORMATION? National Heart, Lung, and Blood Institute: [employer]   This information is not intended to replace advice given to you by your health care provider. Make sure you discuss any questions you have with your health care provider.   Document Released: 10/16/2011 Document Revised: 11/17/2014 Document Reviewed: 08/31/2013 Elsevier Interactive Patient Education [employer]

## 2016-08-19 NOTE — Progress Notes (Signed)
Orders entered for next week 

## 2016-08-19 NOTE — Assessment & Plan Note (Addendum)
Check TSH; ordered mammogram; patient truly believes the weight loss is due to how she has eaten since the death of her husband in May; she is starting to eat a little better now; she does not think she has cancer; colonoscopy UTD per HM list; I advised patient to let me know if she loses more weight

## 2016-08-19 NOTE — Assessment & Plan Note (Signed)
Recheck fasting glucose and A1c; avoid white bread, white rice, sugary things

## 2016-08-19 NOTE — Progress Notes (Signed)
BP 138/82 (BP Location: Right Arm, Patient Position: Sitting, Cuff Size: Normal)   Pulse 62   Temp 98 F (36.7 C) (Oral)   Resp 16   Wt 125 lb 6 oz (56.9 kg)   SpO2 97%   BMI 20.86 kg/m    Subjective:    Patient ID: Laura Deleon, female    DOB: 27-Jul-1946, 70 y.o.   MRN: [id number]  HPI: Laura Deleon is a 70 y.o. female  Chief Complaint  Patient presents with  . Medication Refill  . Sinusitis   She is here new to me; previous doctor left the practice; having sinus congestion, lots of congestion; allergic to ragweed; happens during the fall real bad; he has been trying lemon and vitamin C; kind of helped the dryness in her throat; she tries to drink a lot of water, but says "not enough"; no fever; no ear problems; maybe fluid build-up; in her chest now; no sore throat; she has not been taking claritin for a long time; she has flonase on the med list, but has not used it in a long time; ran out  Reviewed last visit; abnormal mammogram; patient says they checked her out Report from February 14, 2016: IMPRESSION: Stable probably benign calcifications in the upper outer left breast. No evidence of malignancy on the right.  RECOMMENDATION: Twelve month follow-up of left breast calcification. The patient will be due for bilateral mammography at that time.  I have discussed the findings and recommendations with the patient. Results were also provided in writing at the conclusion of the visit. If applicable, a reminder letter will be sent to the patient regarding the next appointment.  BI-RADS CATEGORY  3: Probably benign finding(s) - short interval follow-up suggested.   Electronically Signed   By: Gerome Sam III M.D   On: 02/14/2016 13:46  She went to a heart doctor and she says all that okay; on atenolol, has been on that for a long time; for HTN; had chest discomfort a while ago and that's why she saw the heart doctor; no more chest discomfort now  High  cholesterol; not tolerant of statins; using red yeast rice extract instead; does eat a lot of cheese Lab Results  Component Value Date   CHOL 245 (H) 07/20/2015   Lab Results  Component Value Date   HDL 49 07/20/2015   Lab Results  Component Value Date   LDLCALC 167 (H) 07/20/2015   Lab Results  Component Value Date   TRIG 143 07/20/2015   Lab Results  Component Value Date   CHOLHDL 5.0 (H) 07/20/2015   No results found for: LDLDIRECT  She has a hx of elevated A1c, prediabetes per the chart  Depression screen [facility] 2/9 08/19/2016 01/08/2016 07/19/2015  Decreased Interest 0 0 0  Down, Depressed, Hopeless 0 0 0  PHQ - 2 Score 0 0 0   Relevant past medical, surgical, family and social history reviewed Past Medical History:  Diagnosis Date  . Allergy   . Hernia of anterior abdominal wall   . Hyperlipidemia   . Hypertension    Past Surgical History:  Procedure Laterality Date  . ABDOMINAL HYSTERECTOMY     Family History  Problem Relation Age of Onset  . Breast cancer Neg Hx   . Hyperlipidemia Mother   . Hypertension Mother   . Hyperlipidemia Father    Social History  Substance Use Topics  . Smoking status: Never Smoker  . Smokeless tobacco: Not on file  .  Alcohol use No  MD note: widowed in May  Interim medical history since last visit reviewed. Allergies and medications reviewed  Review of Systems  Constitutional: Positive for unexpected weight change (her husband passed away in May; it's her nerves, it's affected her appetite, eating better now).   Per HPI unless specifically indicated above     Objective:    BP 138/82 (BP Location: Right Arm, Patient Position: Sitting, Cuff Size: Normal)   Pulse 62   Temp 98 F (36.7 C) (Oral)   Resp 16   Wt 125 lb 6 oz (56.9 kg)   SpO2 97%   BMI 20.86 kg/m   Wt Readings from Last 3 Encounters:  08/19/16 125 lb 6 oz (56.9 kg)  01/08/16 143 lb 4.8 oz (65 kg)  10/12/15 141 lb (64 kg)    Physical Exam    Constitutional: She appears well-developed and well-nourished. No distress.  HENT:  Head: Normocephalic and atraumatic.  Right Ear: Hearing, tympanic membrane, external ear and ear canal normal. Tympanic membrane is not erythematous. No middle ear effusion.  Left Ear: Hearing, tympanic membrane, external ear and ear canal normal. Tympanic membrane is not erythematous.  No middle ear effusion.  Nose: Rhinorrhea and septal deviation present. No mucosal edema.  Mouth/Throat: Oropharynx is clear and moist and mucous membranes are normal.  Dental work noted  Eyes: EOM are normal. No scleral icterus.  Neck: No thyromegaly present.  Cardiovascular: Normal rate, regular rhythm and normal heart sounds.   No murmur heard. Pulmonary/Chest: Effort normal and breath sounds normal. No respiratory distress. She has no wheezes.  Abdominal: Soft. Bowel sounds are normal. She exhibits no distension.  Musculoskeletal: Normal range of motion. She exhibits no edema.  Lymphadenopathy:    She has no cervical adenopathy.  Neurological: She is alert. She exhibits normal muscle tone.  Skin: Skin is warm and dry. She is not diaphoretic. No pallor.  Psychiatric: She has a normal mood and affect. Her behavior is normal. Judgment and thought content normal. Her mood appears not anxious. She does not exhibit a depressed mood.   Results for orders placed or performed in visit on 10/12/15  D-Dimer, Quantitative  Result Value Ref Range   D-DIMER 0.45 0.00 - 0.49 mg/L FEU  Troponin I  Result Value Ref Range   Troponin I <0.01 0.00 - 0.04 ng/mL      Assessment & Plan:   Problem List Items Addressed This Visit      Cardiovascular and Mediastinum   Hypertension goal BP (blood pressure) < 140/90 (Chronic)    Controlled, continue beta-blocker; DASH guidelines        Respiratory   Allergic rhinitis, seasonal - Primary (Chronic)    Start back on plain claritin and nasal spray; avoid triggers        Other    Weight loss    Check TSH; ordered mammogram; patient truly believes the weight loss is due to how she has eaten since the death of her husband in May; she is starting to eat a little better now; she does not think she has cancer; colonoscopy UTD per HM list; I advised patient to let me know if she loses more weight      Relevant Orders   TSH   Statin intolerance    Avoiding statin; using red yeast rice      Hyperlipidemia LDL goal <100 (Chronic)    Check fasting labs on another day when she can return; intolerant of statins; she has  been only taking one RYR capsule and will start taking two at a time; avoid saturated fats      Borderline diabetes    Recheck fasting glucose and A1c; avoid white bread, white rice, sugary things      Abnormal screening mammogram    Due for screening mammogram in late Nov 2017      Relevant Orders   MM Digital Diagnostic Bilat    Other Visit Diagnoses    Needs flu shot       Relevant Orders   Flu vaccine HIGH DOSE PF (Fluzone High dose) (Completed)      Follow up plan: Return in about 6 months (around 02/17/2017) for fasting labs and visit.  An after-visit summary was printed and given to the patient at check-out.  Please see the patient instructions which may contain other information and recommendations beyond what is mentioned above in the assessment and plan.  Meds ordered this encounter  Medications  . loratadine (CLARITIN) 10 MG tablet    Sig: Take 1 tablet (10 mg total) by mouth daily as needed for allergies.    Dispense:  30 tablet    Refill:  11  . fluticasone (FLONASE) 50 MCG/ACT nasal spray    Sig: Place 2 sprays into both nostrils daily.    Dispense:  16 g    Refill:  11    Orders Placed This Encounter  Procedures  . MM Digital Diagnostic Bilat  . Flu vaccine HIGH DOSE PF (Fluzone High dose)  . TSH

## 2016-08-19 NOTE — Assessment & Plan Note (Signed)
Check A1c, glucose fasting

## 2016-08-19 NOTE — Assessment & Plan Note (Signed)
Check labs 

## 2016-08-19 NOTE — Assessment & Plan Note (Signed)
Due for screening mammogram in late Nov 2017

## 2016-08-19 NOTE — Assessment & Plan Note (Signed)
Avoiding statin; using red yeast rice

## 2016-08-19 NOTE — Assessment & Plan Note (Signed)
Check fasting labs on another day when she can return; intolerant of statins; she has been only taking one RYR capsule and will start taking two at a time; avoid saturated fats

## 2016-09-02 ENCOUNTER — Telehealth: Payer: Self-pay

## 2016-09-02 DIAGNOSIS — Z5181 Encounter for therapeutic drug level monitoring: Secondary | ICD-10-CM | POA: Diagnosis not present

## 2016-09-02 DIAGNOSIS — R7303 Prediabetes: Secondary | ICD-10-CM | POA: Diagnosis not present

## 2016-09-02 DIAGNOSIS — E785 Hyperlipidemia, unspecified: Secondary | ICD-10-CM | POA: Diagnosis not present

## 2016-09-02 DIAGNOSIS — R634 Abnormal weight loss: Secondary | ICD-10-CM | POA: Diagnosis not present

## 2016-09-02 NOTE — Telephone Encounter (Signed)
Patient needs refill on atenolol but it is on backorder do you want her to try something else?

## 2016-09-03 ENCOUNTER — Telehealth: Payer: Self-pay

## 2016-09-03 LAB — CBC WITH DIFFERENTIAL/PLATELET
BASOS ABS: 0 {cells}/uL (ref 0–200)
BASOS PCT: 0 %
EOS ABS: 87 {cells}/uL (ref 15–500)
EOS PCT: 1 %
HCT: 43.9 % (ref 35.0–45.0)
HEMOGLOBIN: 15.1 g/dL (ref 11.7–15.5)
LYMPHS ABS: 2001 {cells}/uL (ref 850–3900)
Lymphocytes Relative: 23 %
MCH: 30.1 pg (ref 27.0–33.0)
MCHC: 34.4 g/dL (ref 32.0–36.0)
MCV: 87.5 fL (ref 80.0–100.0)
MONOS PCT: 7 %
MPV: 9.3 fL (ref 7.5–12.5)
Monocytes Absolute: 609 cells/uL (ref 200–950)
NEUTROS ABS: 6003 {cells}/uL (ref 1500–7800)
Neutrophils Relative %: 69 %
PLATELETS: 233 10*3/uL (ref 140–400)
RBC: 5.02 MIL/uL (ref 3.80–5.10)
RDW: 13.2 % (ref 11.0–15.0)
WBC: 8.7 10*3/uL (ref 3.8–10.8)

## 2016-09-03 LAB — COMPLETE METABOLIC PANEL WITH GFR
ALBUMIN: 4.2 g/dL (ref 3.6–5.1)
ALK PHOS: 60 U/L (ref 33–130)
ALT: 20 U/L (ref 6–29)
AST: 27 U/L (ref 10–35)
BILIRUBIN TOTAL: 0.8 mg/dL (ref 0.2–1.2)
BUN: 10 mg/dL (ref 7–25)
CALCIUM: 9.8 mg/dL (ref 8.6–10.4)
CO2: 28 mmol/L (ref 20–31)
CREATININE: 0.68 mg/dL (ref 0.50–0.99)
Chloride: 98 mmol/L (ref 98–110)
Glucose, Bld: 90 mg/dL (ref 65–99)
Potassium: 5.2 mmol/L (ref 3.5–5.3)
Sodium: 135 mmol/L (ref 135–146)
TOTAL PROTEIN: 6.9 g/dL (ref 6.1–8.1)

## 2016-09-03 LAB — TSH: TSH: 0.9 m[IU]/L

## 2016-09-03 LAB — HEMOGLOBIN A1C
Hgb A1c MFr Bld: 5.5 % (ref <5.7)
MEAN PLASMA GLUCOSE: 111 mg/dL

## 2016-09-03 LAB — LIPID PANEL
CHOL/HDL RATIO: 3.9 ratio (ref <=5.0)
CHOLESTEROL: 214 mg/dL — AB (ref 125–200)
HDL: 55 mg/dL (ref >=46)
LDL Cholesterol: 147 mg/dL — ABNORMAL HIGH (ref <130)
Triglycerides: 59 mg/dL (ref <150)
VLDL: 12 mg/dL (ref <30)

## 2016-09-04 MED ORDER — METOPROLOL SUCCINATE ER 50 MG PO TB24: 50.0000 mg | ORAL_TABLET | Freq: Every day | ORAL | 3 refills | Status: DC

## 2016-09-04 NOTE — Telephone Encounter (Signed)
Please let pt know that I sent in a new prescription This will replace the atenolol Return in 7-10 days to recheck pulse and BP; thank you

## 2016-09-05 NOTE — Telephone Encounter (Signed)
Pt.notified

## 2016-09-12 NOTE — Telephone Encounter (Signed)
error 

## 2016-09-19 ENCOUNTER — Ambulatory Visit: Payer: PPO

## 2016-09-19 VITALS — BP 126/80 | HR 68

## 2016-09-19 DIAGNOSIS — I10 Essential (primary) hypertension: Secondary | ICD-10-CM

## 2016-09-19 NOTE — Progress Notes (Signed)
Patient here for bp check. Normal

## 2016-11-14 ENCOUNTER — Encounter: Payer: Self-pay | Admitting: Family Medicine

## 2016-11-14 ENCOUNTER — Ambulatory Visit (INDEPENDENT_AMBULATORY_CARE_PROVIDER_SITE_OTHER): Payer: PPO | Admitting: Family Medicine

## 2016-11-14 VITALS — BP 120/80 | HR 80 | Temp 98.2°F | Resp 14 | Wt 117.2 lb

## 2016-11-14 DIAGNOSIS — S65312A Laceration of deep palmar arch of left hand, initial encounter: Secondary | ICD-10-CM

## 2016-11-14 DIAGNOSIS — R634 Abnormal weight loss: Secondary | ICD-10-CM | POA: Diagnosis not present

## 2016-11-14 DIAGNOSIS — S61422A Laceration with foreign body of left hand, initial encounter: Secondary | ICD-10-CM | POA: Diagnosis not present

## 2016-11-14 DIAGNOSIS — Z23 Encounter for immunization: Secondary | ICD-10-CM | POA: Diagnosis not present

## 2016-11-14 HISTORY — PX: HAND SURGERY: SHX662

## 2016-11-14 MED ORDER — AMOXICILLIN-POT CLAVULANATE 875-125 MG PO TABS: 1.0000 | ORAL_TABLET | Freq: Two times a day (BID) | ORAL | 0 refills | Status: AC

## 2016-11-14 NOTE — Assessment & Plan Note (Signed)
Explained my worry about her, and want her to come in soon within the next week or two

## 2016-11-14 NOTE — Progress Notes (Signed)
BP 120/80   Pulse 80   Temp 98.2 F (36.8 C)   Resp 14   Wt 117 lb 3 oz (53.2 kg)   SpO2 97%   BMI 19.50 kg/m    Subjective:    Patient ID: Laura Deleon, female    DOB: 1946/09/12, 71 y.o.   MRN: [id number]  HPI: Laura Deleon is a 71 y.o. female  Chief Complaint  Patient presents with  . Fall    hurt left hand on sunday, bruised. Patient did not go to ER or urgent care   She fell and tripped on something on Sunday Had her hands full of stuff for breakfast; rushing and tripped over cord to heater in the kitchen Broke glass and cut by glass on the palm of left hand No LOC  [facility]/14/2016 08/19/2016 01/08/2016 07/19/2015  Decreased Interest 0 0 0 0  Down, Depressed, Hopeless 0 0 0 0  PHQ - 2 Score 0 0 0 0    Relevant past medical, surgical, family and social history reviewed Past Medical History:  Diagnosis Date  . Allergy   . Hernia of anterior abdominal wall   . Hyperlipidemia   . Hypertension    Past Surgical History:  Procedure Laterality Date  . ABDOMINAL HYSTERECTOMY     Family History  Problem Relation Age of Onset  . Breast cancer Neg Hx   . Hyperlipidemia Mother   . Hypertension Mother   . Hyperlipidemia Father    Social History  Substance Use Topics  . Smoking status: Never Smoker  . Smokeless tobacco: Never Used  . Alcohol use No   Interim medical history since last visit reviewed. Allergies and medications reviewed  Review of Systems  Constitutional: Positive for unexpected weight change (lost her husband in May).   Per HPI unless specifically indicated above     Objective:    BP 120/80   Pulse 80   Temp 98.2 F (36.8 C)   Resp 14   Wt 117 lb 3 oz (53.2 kg)   SpO2 97%   BMI 19.50 kg/m   Wt Readings from Last 3 Encounters:  11/14/16 117 lb 3 oz (53.2 kg)  08/19/16 125 lb 6 oz (56.9 kg)  01/08/16 143 lb 4.8 oz (65 kg)    Physical Exam  Constitutional: She appears well-developed and well-nourished.    Musculoskeletal:       Hands: Neurological:  Neurologically intact distal left hand beyond laceration  Skin:  No red streaks proximally, but there is significant bruising along the palm LEFT hand  Psychiatric: Her mood appears not anxious.  neurovascularly intact distal LEFT hand     Assessment & Plan:   Problem List Items Addressed This Visit      Cardiovascular and Mediastinum   Laceration of deep palmar arch of left hand - Primary    Refer to hand surgeon for evaluation, possible exploration; tetanus offered and given today; to ER if worse; start augmentin since it's Friday, will start      Relevant Orders   Ambulatory referral to Orthopedic Surgery     Other   Weight loss    Explained my worry about her, and want her to come in soon within the next week or two       Other Visit Diagnoses    Need for diphtheria-tetanus-pertussis (Tdap) vaccine       Relevant Orders   Tdap vaccine greater than or equal to 7yo IM  Follow up plan: No Follow-up on file.  An after-visit summary was printed and given to the patient at check-out.  Please see the patient instructions which may contain other information and recommendations beyond what is mentioned above in the assessment and plan.  Meds ordered this encounter  Medications  . ibuprofen (ADVIL,MOTRIN) 200 MG tablet    Sig: Take 200 mg by mouth every 6 (six) hours as needed.  Marland Kitchen amoxicillin-clavulanate (AUGMENTIN) 875-125 MG tablet    Sig: Take 1 tablet by mouth 2 (two) times daily.    Dispense:  14 tablet    Refill:  0    Orders Placed This Encounter  Procedures  . Tdap vaccine greater than or equal to 7yo IM  . Ambulatory referral to Orthopedic Surgery

## 2016-11-14 NOTE — Assessment & Plan Note (Signed)
Refer to hand surgeon for evaluation, possible exploration; tetanus offered and given today; to ER if worse; start augmentin since it's Friday, will start

## 2016-12-04 ENCOUNTER — Telehealth: Payer: Self-pay

## 2016-12-04 ENCOUNTER — Encounter: Payer: Self-pay | Admitting: Family Medicine

## 2016-12-04 ENCOUNTER — Other Ambulatory Visit: Payer: Self-pay

## 2016-12-04 ENCOUNTER — Ambulatory Visit (INDEPENDENT_AMBULATORY_CARE_PROVIDER_SITE_OTHER): Payer: PPO | Admitting: Family Medicine

## 2016-12-04 DIAGNOSIS — R7303 Prediabetes: Secondary | ICD-10-CM

## 2016-12-04 DIAGNOSIS — R634 Abnormal weight loss: Secondary | ICD-10-CM | POA: Diagnosis not present

## 2016-12-04 DIAGNOSIS — R739 Hyperglycemia, unspecified: Secondary | ICD-10-CM | POA: Diagnosis not present

## 2016-12-04 DIAGNOSIS — R928 Other abnormal and inconclusive findings on diagnostic imaging of breast: Secondary | ICD-10-CM

## 2016-12-04 DIAGNOSIS — R1904 Left lower quadrant abdominal swelling, mass and lump: Secondary | ICD-10-CM

## 2016-12-04 LAB — TSH: TSH: 1.14 m[IU]/L

## 2016-12-04 LAB — CBC WITH DIFFERENTIAL/PLATELET
BASOS ABS: 85 {cells}/uL (ref 0–200)
Basophils Relative: 1 %
EOS PCT: 1 %
Eosinophils Absolute: 85 cells/uL (ref 15–500)
HCT: 43.8 % (ref 35.0–45.0)
HEMOGLOBIN: 14.6 g/dL (ref 11.7–15.5)
LYMPHS ABS: 2720 {cells}/uL (ref 850–3900)
LYMPHS PCT: 32 %
MCH: 29.4 pg (ref 27.0–33.0)
MCHC: 33.3 g/dL (ref 32.0–36.0)
MCV: 88.1 fL (ref 80.0–100.0)
MPV: 9 fL (ref 7.5–12.5)
Monocytes Absolute: 425 cells/uL (ref 200–950)
Monocytes Relative: 5 %
Neutro Abs: 5185 cells/uL (ref 1500–7800)
Neutrophils Relative %: 61 %
Platelets: 230 10*3/uL (ref 140–400)
RBC: 4.97 MIL/uL (ref 3.80–5.10)
RDW: 13.3 % (ref 11.0–15.0)
WBC: 8.5 10*3/uL (ref 3.8–10.8)

## 2016-12-04 NOTE — Progress Notes (Signed)
BP 138/88   Pulse 67   Temp 98.1 F (36.7 C) (Oral)   Resp 14   Wt 111 lb 9.6 oz (50.6 kg)   SpO2 95%   BMI 18.57 kg/m    Subjective:    Patient ID: Laura Deleon, female    DOB: [birth year], 71 y.o.   MRN: [id number]  HPI: Laura Deleon is a 71 y.o. female  Chief Complaint  Patient presents with  . Follow-up    on weight   Patient is here at my request because of weight loss She thinks she is losing weight because of her teeth They need to pull all of her teeth, first the top and then the bottom later She'll get dentures after that Going to be in [place] Partial upper is broken and she has not been able to eat; she has to take the partial out and then it falls apart and then she has to put it back together Caregiver for mother; she died in 12/26/2010, then caregiver for husband, and she has not time to take care of herself; she is now caring for two grandchildren, full time, "it's a mess", lots of stress; her husband died 04-01-16 and she couldn't eat after that too; food is just now starting tasting good now She says she burns off a lot of calories with her metabolism; she also changed her diet for cholesterol and limited fats No one with thyroid trouble in the family I asked about blood in stool; she says she is really dry down there; she is really thirsty She had a colonoscopy in 2008/12/26; no fam hx of colon cancer S/p hysterectomy and BSO; she had fibroid tumors, no cancer She had a hernia repair at [facility]; noticed hernia seems to be back with bulging in the lower abdomen, more noticeable as she has lost weight She had an abnormal mammogram in April but was not able to get back for the recheck; I urged her to go now  At the last visit, she had a piece of glass caught in her hand and it was surgically removed; she is doing well; no fevers; finished antibiotics; tetanus UTD  Depression screen [facility] 2/9 12/04/2016 11/14/2016 08/19/2016 01/08/2016 07/19/2015  Decreased Interest 0 0 0 0 0    Down, Depressed, Hopeless 1 0 0 0 0  PHQ - 2 Score 1 0 0 0 0   Relevant past medical, surgical, family and social history reviewed Past Medical History:  Diagnosis Date  . Allergy   . Hernia of anterior abdominal wall   . Hyperlipidemia   . Hypertension    Past Surgical History:  Procedure Laterality Date  . ABDOMINAL HYSTERECTOMY    . HAND SURGERY  11/14/2016   removal of foreign body   Family History  Problem Relation Age of Onset  . Hyperlipidemia Mother   . Hypertension Mother   . Hyperlipidemia Father   . Breast cancer Neg Hx    Social History  Substance Use Topics  . Smoking status: Never Smoker  . Smokeless tobacco: Never Used  . Alcohol use No   Interim medical history since last visit reviewed. Allergies and medications reviewed  Review of Systems  Gastrointestinal: Positive for blood in stool (irritated). Negative for nausea and vomiting.  Genitourinary: Positive for hematuria (irritated on the outside).       Uses vaginal hormonal cream for dryness   Per HPI unless specifically indicated above     Objective:  BP 138/88   Pulse 67   Temp 98.1 F (36.7 C) (Oral)   Resp 14   Wt 111 lb 9.6 oz (50.6 kg)   SpO2 95%   BMI 18.57 kg/m   Wt Readings from Last 3 Encounters:  12/04/16 111 lb 9.6 oz (50.6 kg)  11/14/16 117 lb 3 oz (53.2 kg)  08/19/16 125 lb 6 oz (56.9 kg)    Physical Exam  Constitutional: She appears well-developed and well-nourished.  Weight loss noted; down 14 pounds over the last 3-1/2 months  HENT:  Mouth/Throat: Mucous membranes are normal. Abnormal dentition. Dental caries present.  Seven teeth on the bottom, two of which are eroded down to the gum, broken and decayed  Eyes: EOM are normal. No scleral icterus.  Neck: No thyromegaly present.  Cardiovascular: Normal rate and regular rhythm.   Pulmonary/Chest: Effort normal and breath sounds normal.  Abdominal: Soft. Bowel sounds are normal. She exhibits distension and mass  (soft mass and protuberance in teh LLQ, pelvic region with palpable firm area which feels elongated running from medial aspect towards ASIS; nontender, no overlying skin changes).  Musculoskeletal: Normal range of motion. She exhibits no edema.  Neurological: She is alert. She displays no tremor.  No tics  Skin: Skin is warm. No pallor.  Psychiatric: She has a normal mood and affect. Her behavior is normal. Her mood appears not anxious. Her speech is not rapid and/or pressured. She does not exhibit a depressed mood.    Results for orders placed or performed in visit on 08/19/16  CBC with Differential/Platelet  Result Value Ref Range   WBC 8.7 3.8 - 10.8 K/uL   RBC 5.02 3.80 - 5.10 MIL/uL   Hemoglobin 15.1 11.7 - 15.5 g/dL   HCT 43.9 35.0 - 45.0 %   MCV 87.5 80.0 - 100.0 fL   MCH 30.1 27.0 - 33.0 pg   MCHC 34.4 32.0 - 36.0 g/dL   RDW 13.2 11.0 - 15.0 %   Platelets 233 140 - 400 K/uL   MPV 9.3 7.5 - 12.5 fL   Neutro Abs 6,003 1,500 - 7,800 cells/uL   Lymphs Abs 2,001 850 - 3,900 cells/uL   Monocytes Absolute 609 200 - 950 cells/uL   Eosinophils Absolute 87 15 - 500 cells/uL   Basophils Absolute 0 0 - 200 cells/uL   Neutrophils Relative % 69 %   Lymphocytes Relative 23 %   Monocytes Relative 7 %   Eosinophils Relative 1 %   Basophils Relative 0 %   Smear Review Criteria for review not met   Lipid panel  Result Value Ref Range   Cholesterol 214 (H) 125 - 200 mg/dL   Triglycerides 59 <150 mg/dL   HDL 55 >=46 mg/dL   Total CHOL/HDL Ratio 3.9 <=5.0 Ratio   VLDL 12 <30 mg/dL   LDL Cholesterol 147 (H) <130 mg/dL  Hemoglobin A1c  Result Value Ref Range   Hgb A1c MFr Bld 5.5 <5.7 %   Mean Plasma Glucose 111 mg/dL  COMPLETE METABOLIC PANEL WITH GFR  Result Value Ref Range   Sodium 135 135 - 146 mmol/L   Potassium 5.2 3.5 - 5.3 mmol/L   Chloride 98 98 - 110 mmol/L   CO2 28 20 - 31 mmol/L   Glucose, Bld 90 65 - 99 mg/dL   BUN 10 7 - 25 mg/dL   Creat 0.68 0.50 - 0.99 mg/dL    Total Bilirubin 0.8 0.2 - 1.2 mg/dL   Alkaline Phosphatase 60 33 -  130 U/L   AST 27 10 - 35 U/L   ALT 20 6 - 29 U/L   Total Protein 6.9 6.1 - 8.1 g/dL   Albumin 4.2 3.6 - 5.1 g/dL   Calcium 9.8 8.6 - 10.4 mg/dL   GFR, Est African American >89 >=60 mL/min   GFR, Est Non African American >89 >=60 mL/min      Assessment & Plan:   Problem List Items Addressed This Visit      Other   Weight loss    With abdominal swelling, mass; check CT scan abd/pelvis, as well as labs; stool cards given to her to return as well      Relevant Orders   TSH   CT Abdomen Pelvis W Contrast   Borderline diabetes    Check A1c      Relevant Orders   Hemoglobin A1c   Blood glucose elevated (Chronic)    Check glucose and A1c      Relevant Orders   Hemoglobin A1c   Abdominal mass, left lower quadrant    Labs and stool cards and abd/pelvis CT      Relevant Orders   CBC with Differential/Platelet   COMPLETE METABOLIC PANEL WITH GFR   CT Abdomen Pelvis W Contrast      Follow up plan: Return in about 4 weeks (around 01/01/2017) for weight check.  An after-visit summary was printed and given to the patient at [place].  Please see the patient instructions which may contain other information and recommendations beyond what is mentioned above in the assessment and plan.  Meds ordered this encounter  Medications  . ibuprofen (ADVIL,MOTRIN) 400 MG tablet    Sig: Take 1 tablet by mouth daily.    Orders Placed This Encounter  Procedures  . CT Abdomen Pelvis W Contrast  . Hemoglobin A1c  . CBC with Differential/Platelet  . TSH  . COMPLETE METABOLIC PANEL WITH GFR

## 2016-12-04 NOTE — Patient Instructions (Addendum)
Please return the stool cards at your earliest convenience Let's get labs today Let's get a CT scan soon Do get ensure or boost or supplement to help with vitamins and healing Please do have the mammogram done soon

## 2016-12-04 NOTE — Assessment & Plan Note (Signed)
With abdominal swelling, mass; check CT scan abd/pelvis, as well as labs; stool cards given to her to return as well

## 2016-12-04 NOTE — Telephone Encounter (Signed)
I called this patient to inform her that she has been scheduled to have her CT Abdomen Pelvis w/ contrast [facility] Monday[facility] December 08[facility] 2016 [facility], but there was no answer. A message was left for this patient to give us a call so we can go over everything in detail.

## 2016-12-04 NOTE — Assessment & Plan Note (Signed)
Labs and stool cards and abd/pelvis CT

## 2016-12-04 NOTE — Assessment & Plan Note (Signed)
Check glucose and A1c 

## 2016-12-04 NOTE — Assessment & Plan Note (Signed)
Check A1c. 

## 2016-12-05 LAB — COMPLETE METABOLIC PANEL WITH GFR
ALBUMIN: 4.3 g/dL (ref 3.6–5.1)
ALK PHOS: 60 U/L (ref 33–130)
ALT: 13 U/L (ref 6–29)
AST: 19 U/L (ref 10–35)
BILIRUBIN TOTAL: 0.7 mg/dL (ref 0.2–1.2)
BUN: 10 mg/dL (ref 7–25)
CO2: 20 mmol/L (ref 20–31)
Calcium: 10.1 mg/dL (ref 8.6–10.4)
Chloride: 98 mmol/L (ref 98–110)
Creat: 0.61 mg/dL (ref 0.50–0.99)
GFR, Est African American: >89 mL/min (ref >=60)
Glucose, Bld: 103 mg/dL — ABNORMAL HIGH (ref 65–99)
Potassium: 4.5 mmol/L (ref 3.5–5.3)
Sodium: 137 mmol/L (ref 135–146)
TOTAL PROTEIN: 7.2 g/dL (ref 6.1–8.1)

## 2016-12-05 LAB — HEMOGLOBIN A1C
HEMOGLOBIN A1C: 5.2 % (ref <5.7)
MEAN PLASMA GLUCOSE: 103 mg/dL

## 2016-12-08 ENCOUNTER — Ambulatory Visit: Payer: PPO

## 2016-12-15 ENCOUNTER — Ambulatory Visit: Admission: RE | Admit: 2016-12-15 | Payer: PPO | Source: Ambulatory Visit

## 2016-12-15 ENCOUNTER — Other Ambulatory Visit: Payer: PPO

## 2016-12-16 ENCOUNTER — Ambulatory Visit
Admission: RE | Admit: 2016-12-16 | Discharge: 2016-12-16 | Disposition: A | Discharge status: Home / Self Care | Payer: PPO | Source: Ambulatory Visit | Attending: Family Medicine | Admitting: Family Medicine

## 2016-12-16 ENCOUNTER — Telehealth: Payer: Self-pay | Admitting: Family Medicine

## 2016-12-16 DIAGNOSIS — K439 Ventral hernia without obstruction or gangrene: Secondary | ICD-10-CM

## 2016-12-16 DIAGNOSIS — R1904 Left lower quadrant abdominal swelling, mass and lump: Secondary | ICD-10-CM | POA: Diagnosis not present

## 2016-12-16 DIAGNOSIS — K8020 Calculus of gallbladder without cholecystitis without obstruction: Secondary | ICD-10-CM | POA: Insufficient documentation

## 2016-12-16 DIAGNOSIS — I700 Atherosclerosis of aorta: Secondary | ICD-10-CM

## 2016-12-16 DIAGNOSIS — Z9889 Other specified postprocedural states: Secondary | ICD-10-CM | POA: Diagnosis not present

## 2016-12-16 DIAGNOSIS — R634 Abnormal weight loss: Secondary | ICD-10-CM | POA: Diagnosis not present

## 2016-12-16 DIAGNOSIS — E785 Hyperlipidemia, unspecified: Secondary | ICD-10-CM

## 2016-12-16 DIAGNOSIS — Z5181 Encounter for therapeutic drug level monitoring: Secondary | ICD-10-CM

## 2016-12-16 HISTORY — DX: Calculus of gallbladder without cholecystitis without obstruction: K80.20

## 2016-12-16 MED ORDER — IOPAMIDOL (ISOVUE-300) INJECTION 61%
100.0000 mL | Freq: Once | INTRAVENOUS | Status: AC | PRN
  Administered 2016-12-16: 100 mL via INTRAVENOUS

## 2016-12-16 MED ORDER — ATORVASTATIN CALCIUM 40 MG PO TABS: 40.0000 mg | ORAL_TABLET | Freq: Every day | ORAL | 1 refills | Status: DC

## 2016-12-16 NOTE — Telephone Encounter (Signed)
Calling with CT report; patient pre-occupied; will call her back soon

## 2016-12-16 NOTE — Assessment & Plan Note (Signed)
Discussed findings on CT scan with patient; will start statin; reviewed LDL with her; new goal <70; recheck lipids and sgpt in 6-8 weeks; stop RYR; limit saturated fats

## 2016-12-16 NOTE — Assessment & Plan Note (Signed)
Stop RYR; start statin; goal LDL <70; recheck labs in 6-8 weeks

## 2016-12-16 NOTE — Assessment & Plan Note (Signed)
Check sgpt in 6-8 weeks 

## 2016-12-16 NOTE — Assessment & Plan Note (Signed)
Large ventral wall hernia on CT scan; refer to surgeon

## 2016-12-16 NOTE — Telephone Encounter (Signed)
Talked with patient about CT report Large hernia; large gallstone; 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

## 2017-01-02 ENCOUNTER — Ambulatory Visit: Payer: Self-pay | Admitting: Family Medicine

## 2017-02-17 ENCOUNTER — Ambulatory Visit
Admission: RE | Admit: 2017-02-17 | Discharge: 2017-02-17 | Disposition: A | Discharge status: Home / Self Care | Payer: PPO | Source: Ambulatory Visit | Attending: Family Medicine | Admitting: Family Medicine

## 2017-02-17 DIAGNOSIS — R921 Mammographic calcification found on diagnostic imaging of breast: Secondary | ICD-10-CM | POA: Insufficient documentation

## 2017-02-17 DIAGNOSIS — R928 Other abnormal and inconclusive findings on diagnostic imaging of breast: Secondary | ICD-10-CM

## 2017-02-17 DIAGNOSIS — R922 Inconclusive mammogram: Secondary | ICD-10-CM | POA: Diagnosis not present

## 2017-02-19 ENCOUNTER — Encounter: Payer: Self-pay | Admitting: Family Medicine

## 2017-02-19 ENCOUNTER — Ambulatory Visit (INDEPENDENT_AMBULATORY_CARE_PROVIDER_SITE_OTHER): Payer: PPO | Admitting: Family Medicine

## 2017-02-19 VITALS — BP 128/82 | HR 91 | Temp 98.2°F | Resp 14 | Wt 113.2 lb

## 2017-02-19 DIAGNOSIS — Z5181 Encounter for therapeutic drug level monitoring: Secondary | ICD-10-CM | POA: Diagnosis not present

## 2017-02-19 DIAGNOSIS — I10 Essential (primary) hypertension: Secondary | ICD-10-CM

## 2017-02-19 DIAGNOSIS — Z23 Encounter for immunization: Secondary | ICD-10-CM

## 2017-02-19 DIAGNOSIS — K439 Ventral hernia without obstruction or gangrene: Secondary | ICD-10-CM | POA: Diagnosis not present

## 2017-02-19 DIAGNOSIS — E785 Hyperlipidemia, unspecified: Secondary | ICD-10-CM | POA: Diagnosis not present

## 2017-02-19 DIAGNOSIS — S65312S Laceration of deep palmar arch of left hand, sequela: Secondary | ICD-10-CM

## 2017-02-19 DIAGNOSIS — R739 Hyperglycemia, unspecified: Secondary | ICD-10-CM

## 2017-02-19 LAB — LIPID PANEL
Cholesterol: 188 mg/dL (ref <200)
HDL: 68 mg/dL (ref >50)
LDL CALC: 106 mg/dL — AB (ref <100)
TRIGLYCERIDES: 72 mg/dL (ref <150)
Total CHOL/HDL Ratio: 2.8 Ratio (ref <5.0)
VLDL: 14 mg/dL (ref <30)

## 2017-02-19 LAB — ALT: ALT: 14 U/L (ref 6–29)

## 2017-02-19 NOTE — Assessment & Plan Note (Signed)
Healed, recommended gentle stretching, ROM exercises; offered referral to OT hand specialist; she'll try exercises at home and let me know if she'd like referral

## 2017-02-19 NOTE — Patient Instructions (Addendum)
Let's get labs today I'm so pleased with how you're doing  Hernia, Adult A hernia is the bulging of an organ or tissue through a weak spot in the muscles of the abdomen (abdominal wall). Hernias develop most often near the navel or groin. There are many kinds of hernias. Common kinds include:  Femoral hernia. This kind of hernia develops under the groin in the upper thigh area.  Inguinal hernia. This kind of hernia develops in the groin or scrotum.  Umbilical hernia. This kind of hernia develops near the navel.  Hiatal hernia. This kind of hernia causes part of the stomach to be pushed up into the chest.  Incisional hernia. This kind of hernia bulges through a scar from an abdominal surgery. What are the causes? This condition may be caused by:  Heavy lifting.  Coughing over a long period of time.  Straining to have a bowel movement.  An incision made during an abdominal surgery.  A birth defect (congenital defect).  Excess weight or obesity.  Smoking.  Poor nutrition.  Cystic fibrosis.  Excess fluid in the abdomen.  Undescended testicles. What are the signs or symptoms? Symptoms of a hernia include:  A lump on the abdomen. This is the first sign of a hernia. The lump may become more obvious with standing, straining, or coughing. It may get bigger over time if it is not treated or if the condition causing it is not treated.  Pain. A hernia is usually painless, but it may become painful over time if treatment is delayed. The pain is usually dull and may get worse with standing or lifting heavy objects. Sometimes a hernia gets tightly squeezed in the weak spot (strangulated) or stuck there (incarcerated) and causes additional symptoms. These symptoms may include:  Vomiting.  Nausea.  Constipation.  Irritability. How is this diagnosed? A hernia may be diagnosed with:  A physical exam. During the exam your health care provider may ask you to cough or to make a  specific movement, because a hernia is usually more visible when you move.  Imaging tests. These can include:  X-rays.  Ultrasound.  CT scan. How is this treated? A hernia that is small and painless may not need to be treated. A hernia that is large or painful may be treated with surgery. Inguinal hernias may be treated with surgery to prevent incarceration or strangulation. Strangulated hernias are always treated with surgery, because lack of blood to the trapped organ or tissue can cause it to die. Surgery to treat a hernia involves pushing the bulge back into place and repairing the weak part of the abdomen. Follow these instructions at home:  Avoid straining.  Do not lift anything heavier than 10 lb (4.5 kg).  Lift with your leg muscles, not your back muscles. This helps avoid strain.  When coughing, try to cough gently.  Prevent constipation. Constipation leads to straining with bowel movements, which can make a hernia worse or cause a hernia repair to break down. You can prevent constipation by:  Eating a high-fiber diet that includes plenty of fruits and vegetables.  Drinking enough fluids to keep your urine clear or pale yellow. Aim to drink 6-8 glasses of water per day.  Using a stool softener as directed by your health care provider.  Lose weight, if you are overweight.  Do not use any tobacco products, including cigarettes, chewing tobacco, or electronic cigarettes. If you need help quitting, ask your health care provider.  Keep all follow-up  care provider.  Keep all follow-up visits as directed by your health care provider. This is important. Your health care provider may need to monitor your condition. Contact a health care provider if:  You have swelling, redness, and pain in the affected area.  Your bowel habits change. Get help right away if:  You have a fever.  You have abdominal pain that is getting worse.  You feel nauseous or you vomit.  You cannot push the hernia back  in place by gently pressing on it while you are lying down.  The hernia: ? Changes in shape or size. ? Is stuck outside the abdomen. ? Becomes discolored. ? Feels hard or tender. This information is not intended to replace advice given to you by your health care provider. Make sure you discuss any questions you have with your health care provider. Document Released: 10/27/2005 Document Revised: 03/26/2016 Document Reviewed: 09/06/2014 Elsevier Interactive Patient Education  2017 [employer]  

## 2017-02-19 NOTE — Assessment & Plan Note (Addendum)
Patient opts to watch and wait; reasons to go to ER reviewed, discussed risk of incarceration; she declined referral

## 2017-02-19 NOTE — Assessment & Plan Note (Signed)
Continue med; try to limit saturated fats

## 2017-02-19 NOTE — Assessment & Plan Note (Signed)
Monitor SGPT on the statin 

## 2017-02-19 NOTE — Progress Notes (Signed)
BP 128/82   Pulse 91   Temp 98.2 F (36.8 C) (Oral)   Resp 14   Wt 113 lb 3.2 oz (51.3 kg)   SpO2 97%   BMI 18.84 kg/m    Subjective:    Patient ID: Laura Deleon, female    DOB: April 21, 1946, 71 y.o.   MRN: [id number]  HPI: Laura Deleon is a 71 y.o. female  Chief Complaint  Patient presents with  . Follow-up   HPI HTN; avoiding excess salt; taking beta-blocker Previous weight loss has resolved and started to reverse; she has new upper dentures now; very pleased with them; appetite is good; drinking Ensure, trying to put weight on; going  Mammogram was all okay; they spots have deteriorated, they gave her a good report she says; next due in one year Still having issues with her left hand at site of cut; trouble making fist On cholesterol medicine, statin; no belly pain, just hernia issues; does not want to see anybody about that; it stays soft; she wears mild compressive type girdle  Depression screen [facility] 2/9 02/19/2017 12/04/2016 11/14/2016 08/19/2016 01/08/2016  Decreased Interest 0 0 0 0 0  Down, Depressed, Hopeless 0 1 0 0 0  PHQ - 2 Score 0 1 0 0 0   Relevant past medical, surgical, family and social history reviewed Past Medical History:  Diagnosis Date  . Allergy   . Hernia of anterior abdominal wall   . Hyperlipidemia   . Hypertension    Past Surgical History:  Procedure Laterality Date  . ABDOMINAL HYSTERECTOMY    . HAND SURGERY  11/14/2016   removal of foreign body   Family History  Problem Relation Age of Onset  . Hyperlipidemia Mother   . Hypertension Mother   . Hyperlipidemia Father   . Emphysema Father   . COPD Father   . Breast cancer Neg Hx    Social History  Substance Use Topics  . Smoking status: Never Smoker  . Smokeless tobacco: Never Used  . Alcohol use No    Interim medical history since last visit reviewed. Allergies and medications reviewed  Review of Systems Per HPI unless specifically indicated above     Objective:    BP  128/82   Pulse 91   Temp 98.2 F (36.8 C) (Oral)   Resp 14   Wt 113 lb 3.2 oz (51.3 kg)   SpO2 97%   BMI 18.84 kg/m   Wt Readings from Last 3 Encounters:  02/19/17 113 lb 3.2 oz (51.3 kg)  12/04/16 111 lb 9.6 oz (50.6 kg)  11/14/16 117 lb 3 oz (53.2 kg)    Physical Exam  Constitutional: She appears well-developed and well-nourished. No distress.  HENT:  Mouth/Throat: Oropharynx is clear and moist.  New upper dentures  Cardiovascular: Normal rate and regular rhythm.   Pulmonary/Chest: Effort normal and breath sounds normal. She has no wheezes.  Abdominal: A hernia is present. Hernia confirmed positive in the ventral area.    Musculoskeletal: She exhibits no edema.       Hands: Limited ROM of fingers of LEFT hand; scar tissue notable in the left palm  Neurological:  Reflex Scores:      Patellar reflexes are 2+ on the right side and 2+ on the left side. Skin: She is not diaphoretic. No pallor.  Psychiatric: Her mood appears not anxious. She does not exhibit a depressed mood.  Very pleasant and cooperative; good eye contact; upbeat    Results for  orders placed or performed in visit on 12/04/16  Hemoglobin A1c  Result Value Ref Range   Hgb A1c MFr Bld 5.2 <5.7 %   Mean Plasma Glucose 103 mg/dL  CBC with Differential/Platelet  Result Value Ref Range   WBC 8.5 3.8 - 10.8 K/uL   RBC 4.97 3.80 - 5.10 MIL/uL   Hemoglobin 14.6 11.7 - 15.5 g/dL   HCT 43.8 35.0 - 45.0 %   MCV 88.1 80.0 - 100.0 fL   MCH 29.4 27.0 - 33.0 pg   MCHC 33.3 32.0 - 36.0 g/dL   RDW 13.3 11.0 - 15.0 %   Platelets 230 140 - 400 K/uL   MPV 9.0 7.5 - 12.5 fL   Neutro Abs 5,185 1,500 - 7,800 cells/uL   Lymphs Abs 2,720 850 - 3,900 cells/uL   Monocytes Absolute 425 200 - 950 cells/uL   Eosinophils Absolute 85 15 - 500 cells/uL   Basophils Absolute 85 0 - 200 cells/uL   Neutrophils Relative % 61 %   Lymphocytes Relative 32 %   Monocytes Relative 5 %   Eosinophils Relative 1 %   Basophils Relative 1 %     Smear Review Criteria for review not met   TSH  Result Value Ref Range   TSH 1.14 mIU/L  COMPLETE METABOLIC PANEL WITH GFR  Result Value Ref Range   Sodium 137 135 - 146 mmol/L   Potassium 4.5 3.5 - 5.3 mmol/L   Chloride 98 98 - 110 mmol/L   CO2 20 20 - 31 mmol/L   Glucose, Bld 103 (H) 65 - 99 mg/dL   BUN 10 7 - 25 mg/dL   Creat 0.61 0.50 - 0.99 mg/dL   Total Bilirubin 0.7 0.2 - 1.2 mg/dL   Alkaline Phosphatase 60 33 - 130 U/L   AST 19 10 - 35 U/L   ALT 13 6 - 29 U/L   Total Protein 7.2 6.1 - 8.1 g/dL   Albumin 4.3 3.6 - 5.1 g/dL   Calcium 10.1 8.6 - 10.4 mg/dL   GFR, Est African American >89 >=60 mL/min   GFR, Est Non African American >89 >=60 mL/min      Assessment & Plan:   Problem List Items Addressed This Visit      Cardiovascular and Mediastinum   Laceration of deep palmar arch of left hand    Healed, recommended gentle stretching, ROM exercises; offered referral to OT hand specialist; she'll try exercises at home and let me know if she'd like referral      Hypertension goal BP (blood pressure) < 140/90 - Primary (Chronic)    Well-controlled; try to limit salt        Other   Hyperlipidemia LDL goal <70    Continue med; try to limit saturated fats      Encounter for medication monitoring    Monitor SGPT on the statin      Blood glucose elevated (Chronic)    Hx of elevated glucose; last A1c was excellent; will just monitor glucose periodically, can check A1c if numbers start to go up      Abdominal wall hernia    Patient opts to watch and wait; reasons to go to ER reviewed, discussed risk of incarceration; she declined referral       Other Visit Diagnoses    Need for 23-polyvalent pneumococcal polysaccharide vaccine       Relevant Orders   Pneumococcal polysaccharide vaccine 23-valent greater than or equal to 2yo subcutaneous/IM  Follow up plan: Return in about 6 months (around 08/21/2017) for twenty minute follow-up with fasting labs.  An  after-visit summary was printed and given to the patient at [place].  Please see the patient instructions which may contain other information and recommendations beyond what is mentioned above in the assessment and plan.  No orders of the defined types were placed in this encounter.   Orders Placed This Encounter  Procedures  . Pneumococcal polysaccharide vaccine 23-valent greater than or equal to 2yo subcutaneous/IM

## 2017-02-19 NOTE — Assessment & Plan Note (Signed)
Well-controlled; try to limit salt 

## 2017-02-19 NOTE — Assessment & Plan Note (Signed)
Hx of elevated glucose; last A1c was excellent; will just monitor glucose periodically, can check A1c if numbers start to go up

## 2017-02-21 IMAGING — MG MM DIAG BREAST TOMO UNI LEFT
8 series · 8 of 16 positions shown · non-contrast
Comparison: Previous exam(s).

CLINICAL DATA: Six month follow-up of probably benign
calcifications in the left breast.

EXAM:
DIGITAL DIAGNOSTIC LEFT MAMMOGRAM WITH 3D TOMOSYNTHESIS AND CAD

[L ML]
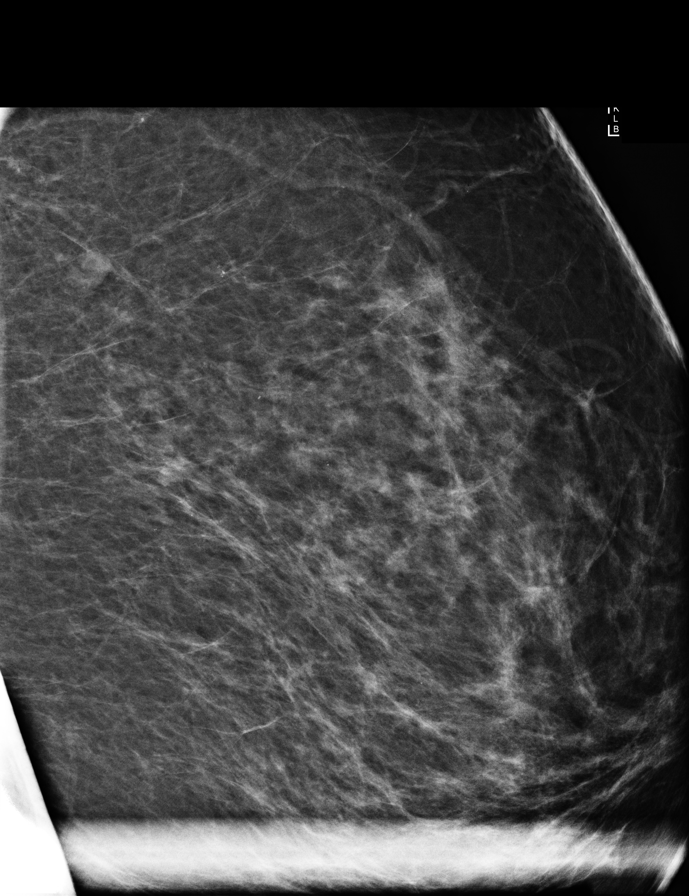

[L CC (1 of 2)]
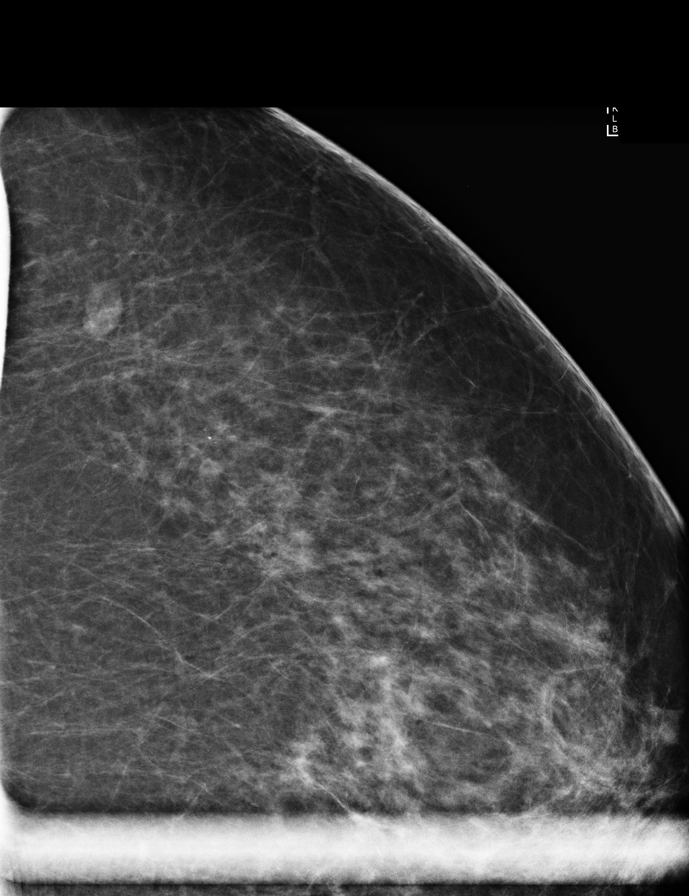

[L MLO]
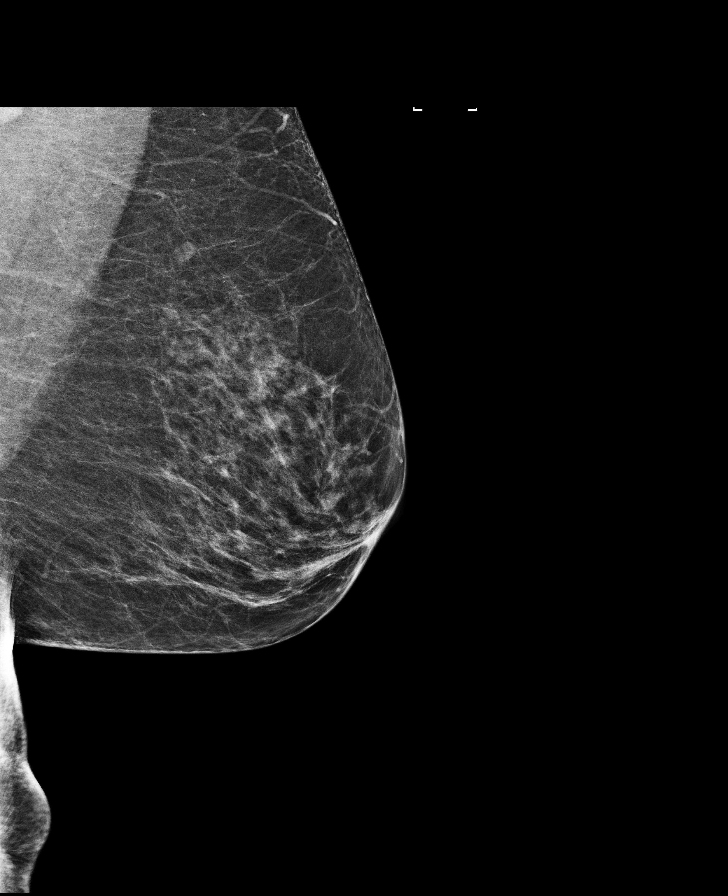

[L CC synth-2D]
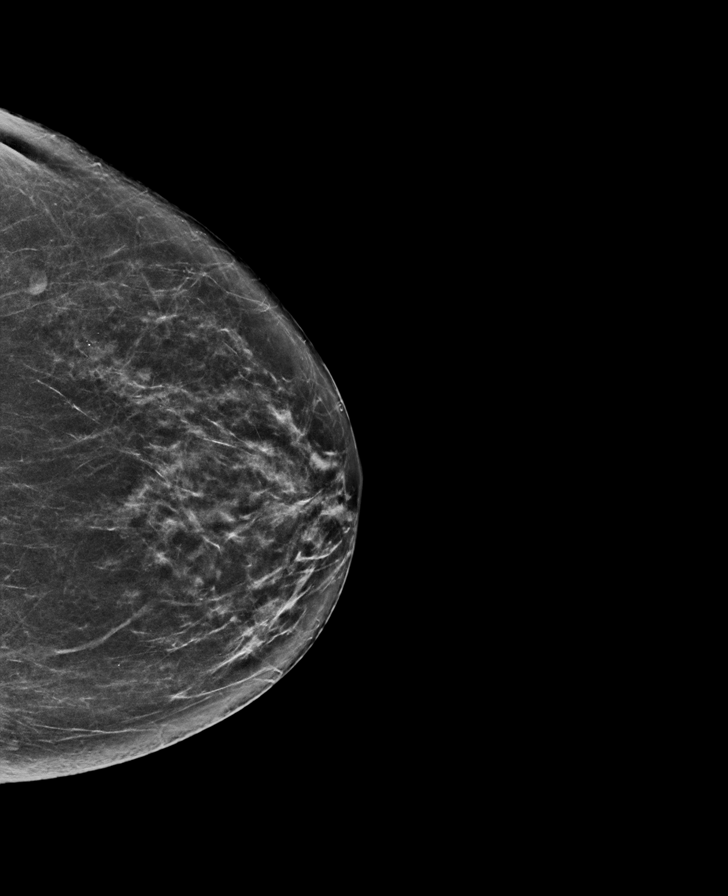

[L MLO synth-2D]
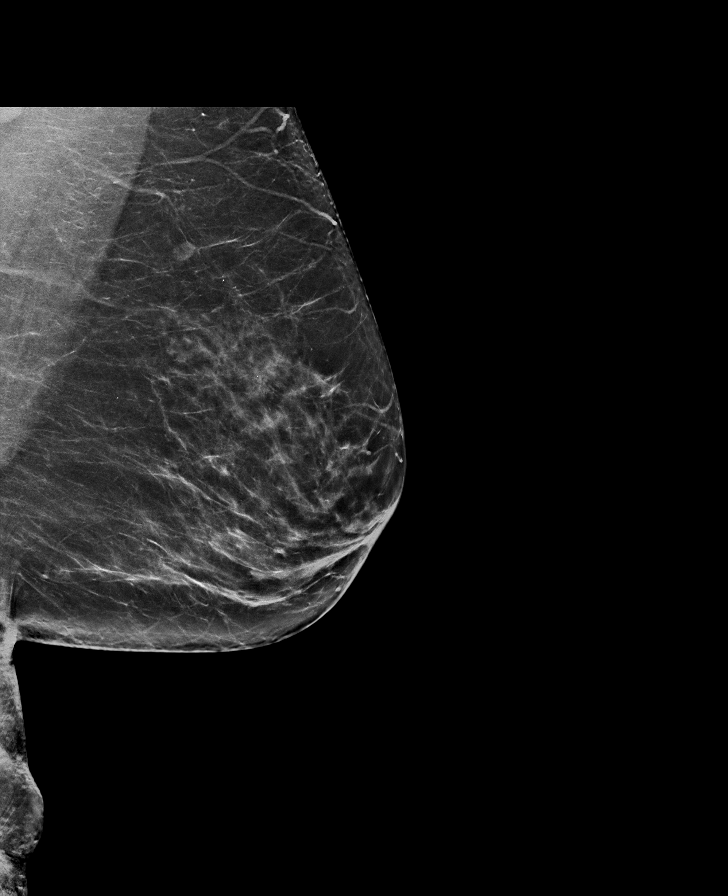

[L CC (2 of 2)]
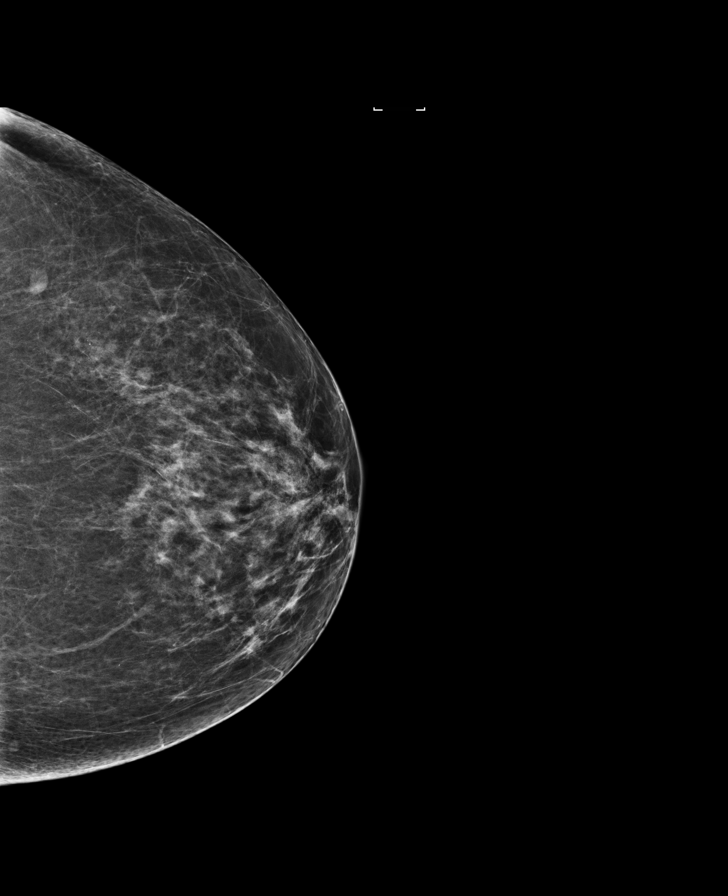

[L CC tomo · tomo slice 34/67.0]
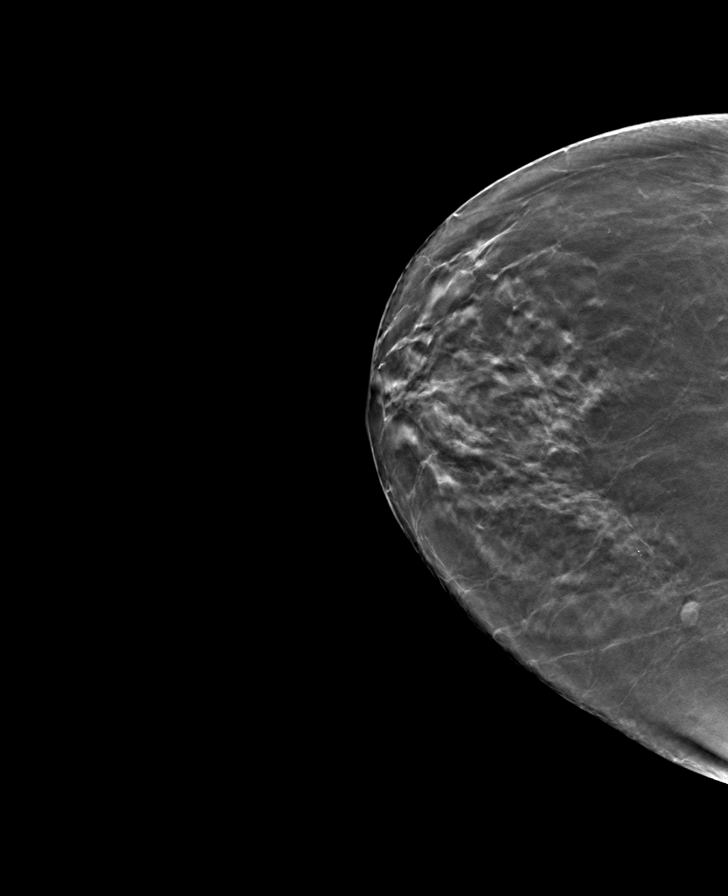

[L MLO tomo · tomo slice 39/78.0]
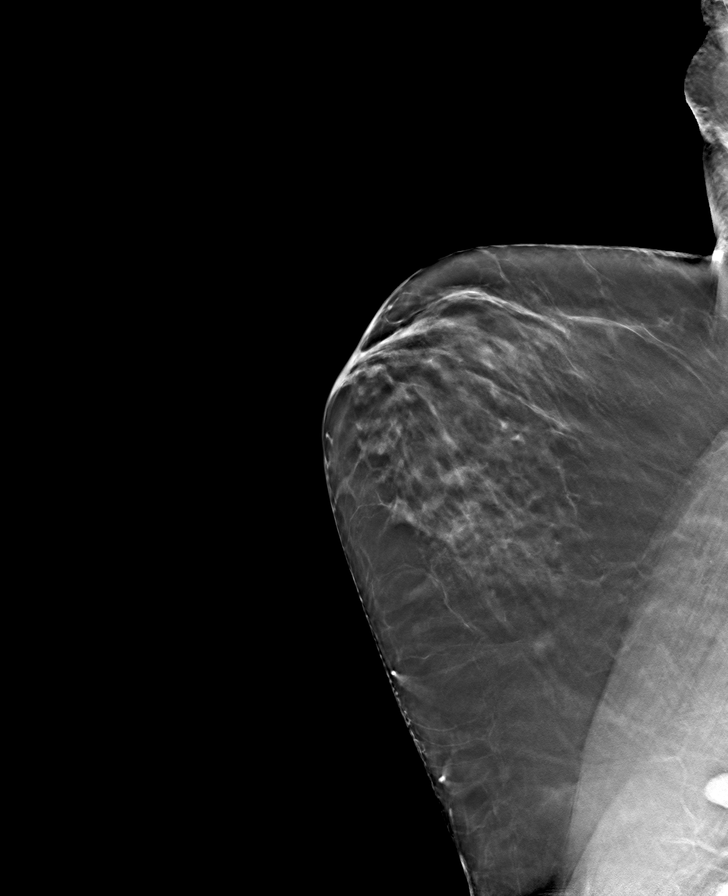

[8 of 16 positions shown; findings below may reference images not displayed]

ACR Breast Density Category b: There are scattered areas of
fibroglandular density.
FINDINGS: No mass or distortion is identified in the left breast.
Magnification views performed of the upper outer left breast show
stable loosely grouped and scattered round and punctate
calcifications. No worrisome morphologies or distribution of
calcifications.

Mammographic images were processed with CAD.
IMPRESSION: Stable probably benign calcifications in the left breast.

RECOMMENDATION:
Bilateral diagnostic mammogram is recommended in January 2016.

I have discussed the findings and recommendations with the patient.
Results were also provided in writing at the conclusion of the
visit. If applicable, a reminder letter will be sent to the patient
regarding the next appointment.

BI-RADS CATEGORY  3: Probably benign.

## 2017-02-27 ENCOUNTER — Other Ambulatory Visit: Payer: Self-pay | Admitting: Family Medicine

## 2017-02-27 MED ORDER — ATORVASTATIN CALCIUM 40 MG PO TABS: 40.0000 mg | ORAL_TABLET | Freq: Every day | ORAL | 6 refills | Status: DC

## 2017-02-27 NOTE — Progress Notes (Signed)
Normal sgpt, improvement in lipid panel on statin; refills sent

## 2017-03-02 ENCOUNTER — Telehealth: Payer: Self-pay

## 2017-03-02 ENCOUNTER — Other Ambulatory Visit: Payer: Self-pay | Admitting: Family Medicine

## 2017-03-02 DIAGNOSIS — N952 Postmenopausal atrophic vaginitis: Secondary | ICD-10-CM

## 2017-03-02 NOTE — Telephone Encounter (Signed)
Spoke with pt about her lab work.he mention to me that she was out of her blood pressure med's.  I saw that she had a refill left. Called the pharmacy and had it refilled.

## 2017-08-21 ENCOUNTER — Ambulatory Visit: Payer: Self-pay | Admitting: Family Medicine

## 2017-08-27 ENCOUNTER — Encounter: Payer: Self-pay | Admitting: Family Medicine

## 2017-08-27 ENCOUNTER — Ambulatory Visit (INDEPENDENT_AMBULATORY_CARE_PROVIDER_SITE_OTHER): Payer: PPO | Admitting: Family Medicine

## 2017-08-27 VITALS — BP 132/78 | HR 71 | Temp 98.1°F | Resp 14 | Wt 124.5 lb

## 2017-08-27 DIAGNOSIS — N952 Postmenopausal atrophic vaginitis: Secondary | ICD-10-CM

## 2017-08-27 DIAGNOSIS — I700 Atherosclerosis of aorta: Secondary | ICD-10-CM

## 2017-08-27 DIAGNOSIS — Z1159 Encounter for screening for other viral diseases: Secondary | ICD-10-CM | POA: Diagnosis not present

## 2017-08-27 DIAGNOSIS — J301 Allergic rhinitis due to pollen: Secondary | ICD-10-CM

## 2017-08-27 DIAGNOSIS — E785 Hyperlipidemia, unspecified: Secondary | ICD-10-CM

## 2017-08-27 DIAGNOSIS — I10 Essential (primary) hypertension: Secondary | ICD-10-CM

## 2017-08-27 DIAGNOSIS — K8020 Calculus of gallbladder without cholecystitis without obstruction: Secondary | ICD-10-CM | POA: Diagnosis not present

## 2017-08-27 DIAGNOSIS — Z780 Asymptomatic menopausal state: Secondary | ICD-10-CM | POA: Diagnosis not present

## 2017-08-27 DIAGNOSIS — Z5181 Encounter for therapeutic drug level monitoring: Secondary | ICD-10-CM

## 2017-08-27 DIAGNOSIS — Z23 Encounter for immunization: Secondary | ICD-10-CM | POA: Diagnosis not present

## 2017-08-27 MED ORDER — LORATADINE 10 MG PO TABS: 10.0000 mg | ORAL_TABLET | Freq: Every day | ORAL | 3 refills | Status: DC | PRN

## 2017-08-27 MED ORDER — METOPROLOL SUCCINATE ER 50 MG PO TB24: 50.0000 mg | ORAL_TABLET | Freq: Every day | ORAL | 3 refills | Status: DC

## 2017-08-27 MED ORDER — ESTROGENS, CONJUGATED 0.625 MG/GM VA CREA: TOPICAL_CREAM | VAGINAL | 0 refills | Status: DC

## 2017-08-27 NOTE — Assessment & Plan Note (Signed)
Reasons to call or go to ER reviewed

## 2017-08-27 NOTE — Assessment & Plan Note (Signed)
Check lipids in 6-8 weeks after starting back low dose statin; stop statin right away if any aches or pains and we can try something else; dietary recommendations given

## 2017-08-27 NOTE — Progress Notes (Addendum)
BP 132/78   Pulse 71   Temp 98.1 F (36.7 C) (Oral)   Resp 14   Wt 124 lb 8 oz (56.5 kg)   SpO2 98%   BMI 20.72 kg/m    Subjective:    Patient ID: Laura Deleon, female    DOB: 18-Mar-1946, 71 y.o.   MRN: [id number]  HPI: Laura Deleon is a 71 y.o. female  Chief Complaint  Patient presents with  . Follow-up    6 month   HPI She feels great She is here for f/u Allergies; acting up this time of year; thinks it is ragweed; sneezing, gets in her sinuses, drainage down to the throat, dry scratchy throat; drinks more water; trying to keep it controlled; cats are outside; she prefers to use saline instead of corticosteroid  High cholesterol; on the statin; has not been taking it like she should, she admits; she had joint pain and stopped it a few months; room for improvement with her diet, she admits; has two small children they watching Lab Results  Component Value Date   CHOL 188 02/19/2017   CHOL 214 (H) 09/02/2016   CHOL 245 (H) 07/20/2015   Lab Results  Component Value Date   HDL 68 02/19/2017   HDL 55 09/02/2016   HDL 49 07/20/2015   Lab Results  Component Value Date   LDLCALC 106 (H) 02/19/2017   LDLCALC 147 (H) 09/02/2016   LDLCALC 167 (H) 07/20/2015   Lab Results  Component Value Date   TRIG 72 02/19/2017   TRIG 59 09/02/2016   TRIG 143 07/20/2015   Lab Results  Component Value Date   CHOLHDL 2.8 02/19/2017   CHOLHDL 3.9 09/02/2016   CHOLHDL 5.0 (H) 07/20/2015   No results found for: LDLDIRECT  Vaginal dryness, will actually bleed from being so irritated and raw; using the estrogen cream with success; no breast cancer, no breast lumps  Hypertension; taking metoprolol  Hx of gallstone; no abdominal pain, no symptoms  Depression screen [facility] 2/9 08/27/2017 02/19/2017 12/04/2016 11/14/2016 08/19/2016  Decreased Interest 0 0 0 0 0  Down, Depressed, Hopeless 0 0 1 0 0  PHQ - 2 Score 0 0 1 0 0    Relevant past medical, surgical, family and social history  reviewed Past Medical History:  Diagnosis Date  . Allergy   . Hernia of anterior abdominal wall   . Hyperlipidemia   . Hypertension    Past Surgical History:  Procedure Laterality Date  . ABDOMINAL HYSTERECTOMY    . HAND SURGERY  11/14/2016   removal of foreign body   Family History  Problem Relation Age of Onset  . Hyperlipidemia Mother   . Hypertension Mother   . Hyperlipidemia Father   . Emphysema Father   . COPD Father   . Breast cancer Neg Hx    Social History   Social History  . Marital status: Widowed    Spouse name: N/A  . Number of children: N/A  . Years of education: N/A   Occupational History  . Not on file.   Social History Main Topics  . Smoking status: Never Smoker  . Smokeless tobacco: Never Used  . Alcohol use No  . Drug use: No  . Sexual activity: No   Other Topics Concern  . Not on file   Social History Narrative  . No narrative on file    Interim medical history since last visit reviewed. Allergies and medications reviewed  Review of Systems  Per HPI unless specifically indicated above     Objective:    BP 132/78   Pulse 71   Temp 98.1 F (36.7 C) (Oral)   Resp 14   Wt 124 lb 8 oz (56.5 kg)   SpO2 98%   BMI 20.72 kg/m   Wt Readings from Last 3 Encounters:  08/27/17 124 lb 8 oz (56.5 kg)  02/19/17 113 lb 3.2 oz (51.3 kg)  12/04/16 111 lb 9.6 oz (50.6 kg)    Physical Exam  Constitutional: She appears well-developed and well-nourished. No distress.  HENT:  Head: Normocephalic and atraumatic.  Eyes: EOM are normal. No scleral icterus.  Neck: No thyromegaly present.  Cardiovascular: Normal rate, regular rhythm and normal heart sounds.   No murmur heard. Pulmonary/Chest: Effort normal and breath sounds normal. She has no wheezes.  Abdominal: Soft. Bowel sounds are normal. She exhibits no distension.  Musculoskeletal: Normal range of motion. She exhibits no edema.  Neurological: She is alert. She exhibits normal muscle tone.   Skin: Skin is warm and dry. She is not diaphoretic. No pallor.  Psychiatric: She has a normal mood and affect. Her behavior is normal. Judgment and thought content normal.    Results for orders placed or performed in visit on 02/19/17  ALT  Result Value Ref Range   ALT 14 6 - 29 U/L  Lipid panel  Result Value Ref Range   Cholesterol 188 <200 mg/dL   Triglycerides 72 <161 mg/dL   HDL 68 >09 mg/dL   Total CHOL/HDL Ratio 2.8 <5.0 Ratio   VLDL 14 <30 mg/dL   LDL Cholesterol 604 (H) <100 mg/dL      Assessment & Plan:   Problem List Items Addressed This Visit      Cardiovascular and Mediastinum   Hypertension goal BP (blood pressure) < 140/90 - Primary (Chronic)    Controlled today; continue beta-blocker; try to follow the DASH guidelines      Relevant Medications   atorvastatin (LIPITOR) 40 MG tablet   metoprolol succinate (TOPROL-XL) 50 MG 24 hr tablet   Calcification of abdominal aorta (HCC)    Goal LDL is less than 70      Relevant Medications   atorvastatin (LIPITOR) 40 MG tablet   metoprolol succinate (TOPROL-XL) 50 MG 24 hr tablet   Other Relevant Orders   Lipid panel     Respiratory   Allergic rhinitis, seasonal (Chronic)    Avoid triggers; continue claritin; offered nasal corticosteroid        Digestive   Gallstone    Reasons to call or go to ER reviewed        Genitourinary   Atrophy of vagina   Relevant Medications   conjugated estrogens (PREMARIN) vaginal cream     Other   Postmenopausal    Order DEXA scan      Relevant Orders   DG Bone Density   Hyperlipidemia LDL goal <70    Check lipids in 6-8 weeks after starting back low dose statin; stop statin right away if any aches or pains and we can try something else; dietary recommendations given      Relevant Medications   atorvastatin (LIPITOR) 40 MG tablet   metoprolol succinate (TOPROL-XL) 50 MG 24 hr tablet   Other Relevant Orders   Lipid panel   Encounter for medication monitoring     Check liver and kidneys in 6-8 weeks      Relevant Orders   COMPLETE METABOLIC PANEL WITH GFR  Other Visit Diagnoses    Needs flu shot       Relevant Orders   Flu vaccine HIGH DOSE PF (Fluzone High dose) (Completed)   Encounter for hepatitis C screening test for low risk patient       Relevant Orders   Hepatitis C Antibody       Follow up plan: Return in about 7 weeks (around 10/12/2017) for fasting labs only (b/t Dec 3-17); 6 months with Dr. Sherie DonLada.  An after-visit summary was printed and given to the patient at check-out.  Please see the patient instructions which may contain other information and recommendations beyond what is mentioned above in the assessment and plan.  Meds ordered this encounter  Medications  . atorvastatin (LIPITOR) 40 MG tablet    Sig: One-half of a pill by mouth two nights a week  . DISCONTD: conjugated estrogens (PREMARIN) vaginal cream    Sig: Place vaginally 2 (two) times a week.    Dispense:  30 g    Refill:  0  . metoprolol succinate (TOPROL-XL) 50 MG 24 hr tablet    Sig: Take 1 tablet (50 mg total) by mouth daily. Take with or immediately following a meal.    Dispense:  90 tablet    Refill:  3  . conjugated estrogens (PREMARIN) vaginal cream    Sig: Place vaginally 2 (two) times a week.    Dispense:  30 g    Refill:  0  . loratadine (CLARITIN) 10 MG tablet    Sig: Take 1 tablet (10 mg total) by mouth daily as needed for allergies.    Dispense:  90 tablet    Refill:  3    Orders Placed This Encounter  Procedures  . DG Bone Density  . Flu vaccine HIGH DOSE PF (Fluzone High dose)  . COMPLETE METABOLIC PANEL WITH GFR  . Lipid panel  . Hepatitis C Antibody   Original after visit summary documented in the typed section to take the atorvastatin twice a day; it should have said twice a week; I personally called the patient and let her know that it should be twice a week and that we'll mail an updated AVS to her; she understands

## 2017-08-27 NOTE — Progress Notes (Signed)
  Patient ID: Laura MoseLinda W Deleon, female   DOB: Jul 28, 1946, 71 y.o.   MRN: [id number]   Subjective:   Laura MoseLinda W Deleon is a 71 y.o. female here for a complete physical exam  Interim issues since last visit:  Past Medical History:  Diagnosis Date  . Allergy   . Hernia of anterior abdominal wall   . Hyperlipidemia   . Hypertension    Past Surgical History:  Procedure Laterality Date  . ABDOMINAL HYSTERECTOMY    . HAND SURGERY  11/14/2016   removal of foreign body   Family History  Problem Relation Age of Onset  . Hyperlipidemia Mother   . Hypertension Mother   . Hyperlipidemia Father   . Emphysema Father   . COPD Father   . Breast cancer Neg Hx    Social History  Substance Use Topics  . Smoking status: Never Smoker  . Smokeless tobacco: Never Used  . Alcohol use No   Review of Systems  Objective:   There were no vitals filed for this visit. There is no height or weight on file to calculate BMI. Wt Readings from Last 3 Encounters:  02/19/17 113 lb 3.2 oz (51.3 kg)  12/04/16 111 lb 9.6 oz (50.6 kg)  11/14/16 117 lb 3 oz (53.2 kg)   Physical Exam  Assessment/Plan:   Problem List Items Addressed This Visit    None      No orders of the defined types were placed in this encounter.  No orders of the defined types were placed in this encounter.   Follow up plan: No Follow-up on file.  An After Visit Summary was printed and given to the patient.

## 2017-08-27 NOTE — Assessment & Plan Note (Signed)
Check liver and kidneys in 6-8 weeks 

## 2017-08-27 NOTE — Assessment & Plan Note (Signed)
Controlled today; continue beta-blocker; try to follow the DASH guidelines

## 2017-08-27 NOTE — Patient Instructions (Addendum)
Let's try low dose atorvastatin just twice a WEEK Recheck labs in about 6-8 weeks, between December 3rd and December 17th Please do call to schedule your bone density study; the number to schedule one [facility] or [facility] is [phone] or [phone]

## 2017-08-27 NOTE — Assessment & Plan Note (Signed)
Order DEXA scan 

## 2017-08-27 NOTE — Assessment & Plan Note (Signed)
Avoid triggers; continue claritin; offered nasal corticosteroid

## 2017-08-27 NOTE — Assessment & Plan Note (Signed)
Goal LDL is less than 70 

## 2017-09-07 ENCOUNTER — Ambulatory Visit
Admission: RE | Admit: 2017-09-07 | Discharge: 2017-09-07 | Disposition: A | Discharge status: Home / Self Care | Payer: PPO | Source: Ambulatory Visit | Attending: Family Medicine | Admitting: Family Medicine

## 2017-09-07 DIAGNOSIS — Z780 Asymptomatic menopausal state: Secondary | ICD-10-CM | POA: Insufficient documentation

## 2017-09-07 DIAGNOSIS — M8589 Other specified disorders of bone density and structure, multiple sites: Secondary | ICD-10-CM | POA: Insufficient documentation

## 2017-09-08 ENCOUNTER — Encounter: Payer: Self-pay | Admitting: Family Medicine

## 2017-09-08 DIAGNOSIS — M8580 Other specified disorders of bone density and structure, unspecified site: Secondary | ICD-10-CM

## 2017-09-08 HISTORY — DX: Other specified disorders of bone density and structure, unspecified site: M85.80

## 2017-09-09 ENCOUNTER — Telehealth: Payer: Self-pay

## 2017-09-09 NOTE — Telephone Encounter (Signed)
Called pt no answer. Left detailed message for pt informing her of the information below per Dr.Lada. Advised pt to call back for questions or concerns. CRM created.

## 2017-09-09 NOTE — Telephone Encounter (Signed)
-----   Message from Kerman PasseyMelinda P Lada, MD sent at 09/08/2017  5:01 PM EDT ----- Please explain the following: Your bone density shows that you have osteopenia, which means that your bone is thinner than it should be but not as far along as osteoporosis.  Your risk of a fracture is slightly elevated, but not to the point of requiring medication at this point. We should get another bone scan in two years to monitor. Please do practice fall precautions (don't get up on chairs or ladders to reach high things, don't go out on slippery steps in the winter, always use handrails when going up or down stairs, etc.). Try to get three servings of calcium a day (best in foods/drinks like kale, spinach, broccoli, almond milk, tofu, etc.). Thank you

## 2017-10-12 ENCOUNTER — Other Ambulatory Visit: Payer: Self-pay

## 2017-10-12 DIAGNOSIS — Z1159 Encounter for screening for other viral diseases: Secondary | ICD-10-CM | POA: Diagnosis not present

## 2017-10-12 DIAGNOSIS — E785 Hyperlipidemia, unspecified: Secondary | ICD-10-CM

## 2017-10-12 DIAGNOSIS — I700 Atherosclerosis of aorta: Secondary | ICD-10-CM | POA: Diagnosis not present

## 2017-10-12 DIAGNOSIS — Z5181 Encounter for therapeutic drug level monitoring: Secondary | ICD-10-CM | POA: Diagnosis not present

## 2017-10-12 LAB — COMPLETE METABOLIC PANEL WITH GFR
AG RATIO: 1.6 (calc) (ref 1.0–2.5)
ALKALINE PHOSPHATASE (APISO): 61 U/L (ref 33–130)
ALT: 14 U/L (ref 6–29)
AST: 21 U/L (ref 10–35)
Albumin: 4.2 g/dL (ref 3.6–5.1)
BUN: 13 mg/dL (ref 7–25)
CHLORIDE: 100 mmol/L (ref 98–110)
CO2: 30 mmol/L (ref 20–32)
Calcium: 9.4 mg/dL (ref 8.6–10.4)
Creat: 0.76 mg/dL (ref 0.60–0.93)
GFR, Est African American: 92 mL/min/{1.73_m2} (ref >=60)
GFR, Est Non African American: 79 mL/min/{1.73_m2} (ref >=60)
GLOBULIN: 2.7 g/dL (ref 1.9–3.7)
Glucose, Bld: 96 mg/dL (ref 65–99)
POTASSIUM: 4.3 mmol/L (ref 3.5–5.3)
SODIUM: 137 mmol/L (ref 135–146)
Total Bilirubin: 0.6 mg/dL (ref 0.2–1.2)
Total Protein: 6.9 g/dL (ref 6.1–8.1)

## 2017-10-12 LAB — LIPID PANEL
CHOL/HDL RATIO: 3.7 (calc) (ref <5.0)
CHOLESTEROL: 232 mg/dL — AB (ref <200)
HDL: 62 mg/dL (ref >50)
LDL CHOLESTEROL (CALC): 151 mg/dL — AB
Non-HDL Cholesterol (Calc): 170 mg/dL (calc) — ABNORMAL HIGH (ref <130)
Triglycerides: 89 mg/dL (ref <150)

## 2017-10-12 LAB — HEPATITIS C ANTIBODY
Hepatitis C Ab: NONREACTIVE
SIGNAL TO CUT-OFF: 0.01 (ref <1.00)

## 2017-10-16 ENCOUNTER — Telehealth: Payer: Self-pay

## 2017-10-16 ENCOUNTER — Other Ambulatory Visit: Payer: Self-pay | Admitting: Family Medicine

## 2017-10-16 MED ORDER — EZETIMIBE 10 MG PO TABS: 10.0000 mg | ORAL_TABLET | Freq: Every day | ORAL | 3 refills | Status: DC

## 2017-10-16 NOTE — Progress Notes (Signed)
Sending in Rx for Zetia

## 2017-10-21 NOTE — Telephone Encounter (Signed)
erro  neous encounter

## 2017-11-11 ENCOUNTER — Encounter: Payer: Self-pay | Admitting: Family Medicine

## 2017-11-11 ENCOUNTER — Ambulatory Visit (INDEPENDENT_AMBULATORY_CARE_PROVIDER_SITE_OTHER): Payer: PPO | Admitting: Family Medicine

## 2017-11-11 VITALS — BP 128/62 | HR 69 | Temp 98.0°F | Wt 120.7 lb

## 2017-11-11 DIAGNOSIS — L989 Disorder of the skin and subcutaneous tissue, unspecified: Secondary | ICD-10-CM

## 2017-11-11 DIAGNOSIS — M24541 Contracture, right hand: Secondary | ICD-10-CM | POA: Insufficient documentation

## 2017-11-11 DIAGNOSIS — H1031 Unspecified acute conjunctivitis, right eye: Secondary | ICD-10-CM

## 2017-11-11 MED ORDER — NEOMYCIN-POLYMYXIN-GRAMICIDIN 1.75-10000-.025 OP SOLN: 1.0000 [drp] | Freq: Every day | OPHTHALMIC | 0 refills | Status: AC

## 2017-11-11 NOTE — Patient Instructions (Addendum)
Start the antibiotics for the right eye; use in the left eye if it spreads Warm compresses Don't rub the eyes  Bacterial Conjunctivitis Bacterial conjunctivitis is an infection of your conjunctiva. This is the clear membrane that covers the white part of your eye and the inner surface of your eyelid. This condition can make your eye:  Red or pink.  Itchy.  This condition is caused by bacteria. This condition spreads very easily from person to person (is contagious) and from one eye to the other eye. Follow these instructions at home: Medicines  Take or apply your antibiotic medicine as told by your doctor. Do not stop taking or applying the antibiotic even if you start to feel better.  Take or apply over-the-counter and prescription medicines only as told by your doctor.  Do not touch your eyelid with the eye drop bottle or the ointment tube. Managing discomfort  Wipe any fluid from your eye with a warm, wet washcloth or a cotton ball.  Place a cool, clean washcloth on your eye. Do this for 10-20 minutes, 3-4 times per day. General instructions  Do not wear contact lenses until the irritation is gone. Wear glasses until your doctor says it is okay to wear contacts.  Do not wear eye makeup until your symptoms are gone. Throw away any old makeup.  Change or wash your pillowcase every day.  Do not share towels or washcloths with anyone.  Wash your hands often with soap and water. Use paper towels to dry your hands.  Do not touch or rub your eyes.  Do not drive or use heavy machinery if your vision is blurry. Contact a doctor if:  You have a fever.  Your symptoms do not get better after 10 days. Get help right away if:  You have a fever and your symptoms suddenly get worse.  You have very bad pain when you move your eye.  Your face: ? Hurts. ? Is red. ? Is swollen.  You have sudden loss of vision. This information is not intended to replace advice given to you by  your health care provider. Make sure you discuss any questions you have with your health care provider. Document Released: 08/05/2008 Document Revised: 04/03/2016 Document Reviewed: 08/09/2015 Elsevier Interactive Patient Education  [employer]2018 [employer]

## 2017-11-11 NOTE — Progress Notes (Signed)
BP 128/62 (BP Location: Right Arm, Patient Position: Sitting, Cuff Size: Normal)   Pulse 69   Temp 98 F (36.7 C) (Oral)   Wt 120 lb 11.2 oz (54.7 kg)   SpO2 98%   BMI 20.09 kg/m    Subjective:    Patient ID: Laura Deleon, female    DOB: 11/16/1945, 72 y.o.   MRN: [id number]  HPI: Laura MoseLinda W Menge is a 72 y.o. female  Chief Complaint  Patient presents with  . Conjunctivitis    Right eye, closed shut this morning  . Ankle Pain    rash, right   HPI She has had some watery red eye; woke up with it mattered shut yesterday morning and today; just the RIGHT eye; around child who goes to daycare Thick material in the right eye; eye was glued shut Tried some eye but it burned No fevers Little dry throat, nose blowing with allergies No recent activities to cause eye injury Wears glasses; no contacts  She also has a rash on her ankle, right side It's been there for a "good bit"; feels like shingles; she "can't stand nothing to touch it" Just getting severe Started as a rash; now has a scab; not really getting bigger; broken blood vessels below it; also has bad varicose veins; already checked out by specialist  LEFT hand, has the 4th finger frozen; can't bend it; had surgery in that palm for glass; decreased sensation in the 4th finger  [facility] 2/[facility] 08/27/2017 02/19/2017 12/04/2016 11/14/2016 08/19/2016  Decreased Interest 0 0 0 0 0  Down, Depressed, Hopeless 0 0 1 0 0  PHQ - 2 Score 0 0 1 0 0    Relevant past medical, surgical, family and social history reviewed Past Medical History:  Diagnosis Date  . Allergy   . Hernia of anterior abdominal wall   . Hyperlipidemia   . Hypertension   . Osteopenia 09/08/2017   Oct 2018; next scan => Sep 10, 2019   Past Surgical History:  Procedure Laterality Date  . ABDOMINAL HYSTERECTOMY    . HAND SURGERY  11/14/2016   removal of foreign body   Family History  Problem Relation Age of Onset  . Hyperlipidemia Mother   .  Hypertension Mother   . Hyperlipidemia Father   . Emphysema Father   . COPD Father   . Breast cancer Neg Hx    Social History   Tobacco Use  . Smoking status: Never Smoker  . Smokeless tobacco: Never Used  Substance Use Topics  . Alcohol use: No    Alcohol/week: 0.0 oz  . Drug use: No    Interim medical history since last visit reviewed. Allergies and medications reviewed  Review of Systems Per HPI unless specifically indicated above     Objective:    BP 128/62 (BP Location: Right Arm, Patient Position: Sitting, Cuff Size: Normal)   Pulse 69   Temp 98 F (36.7 C) (Oral)   Wt 120 lb 11.2 oz (54.7 kg)   SpO2 98%   BMI 20.09 kg/m   Wt Readings from Last 3 Encounters:  11/11/17 120 lb 11.2 oz (54.7 kg)  08/27/17 124 lb 8 oz (56.5 kg)  02/19/17 113 lb 3.2 oz (51.3 kg)    Physical Exam  Constitutional: She appears well-developed and well-nourished.  HENT:  Mouth/Throat: Mucous membranes are normal.  Eyes: EOM are normal. Right eye exhibits discharge. Right eye exhibits no hordeolum. Left eye exhibits no discharge and no hordeolum.  Right conjunctiva is injected. Right conjunctiva has no hemorrhage. Left conjunctiva is not injected. Left conjunctiva has no hemorrhage. No scleral icterus. Right eye exhibits normal extraocular motion. Left eye exhibits normal extraocular motion.  Cardiovascular: Normal rate.  Pulses:      Dorsalis pedis pulses are 2+ on the right side.  Pulmonary/Chest: Effort normal. No respiratory distress.  Musculoskeletal:       Right hand: She exhibits deformity. Normal sensation noted.  Thickened tissue along the RIGHT palm at the site of previous surgery, appears to involve the 4th flexor tendon; in addition, the PIP of the 4th finger RIGHT hand is contracted in partial flexion, cannot be straightened  Skin: Lesion noted.  Area on the medial RIGHT lower leg just above the medial malleolus about 4 x 6 cm in dimension with eschar / ulcer at the superior  aspect; skin is irregular, some areas appearing atrophied, cobbled; above and below the area are varicose veins and spider veins  Psychiatric: She has a normal mood and affect. Her behavior is normal. Her mood appears not anxious. She does not exhibit a depressed mood.       Assessment & Plan:   Problem List Items Addressed This Visit      Musculoskeletal and Integument   Contracture of joint of finger of right hand    Offered to a hand OT; patient politely declined for now, welcome to call for referral if desired; will likely continue or worsen without therapy; she'll check for exercises on the internet       Other Visit Diagnoses    Skin lesion of right leg    -  Primary   refer to dermatologist; concerning for cancerous lesion vs vascular changes; explained that this is not shingles, may require biopsy by derm   Relevant Orders   Ambulatory referral to Dermatology   Acute bacterial conjunctivitis of right eye       will start antibiotics; very contagious       Follow up plan: No Follow-up on file.  An after-visit summary was printed and given to the patient at check-out.  Please see the patient instructions which may contain other information and recommendations beyond what is mentioned above in the assessment and plan.  Meds ordered this encounter  Medications  . neomycin-polymyxin-gramicidin (NEOSPORIN) 1.75-10000-.025 ophthalmic solution    Sig: Place 1 drop into the right eye 5 (five) times daily for 10 days.    Dispense:  10 mL    Refill:  0    Orders Placed This Encounter  Procedures  . Ambulatory referral to Dermatology

## 2017-11-11 NOTE — Assessment & Plan Note (Addendum)
Offered to a hand OT; patient politely declined for now, welcome to call for referral if desired; will likely continue or worsen without therapy; she'll check for exercises on the internet

## 2017-11-28 ENCOUNTER — Other Ambulatory Visit: Payer: Self-pay | Admitting: Family Medicine

## 2017-11-28 DIAGNOSIS — N952 Postmenopausal atrophic vaginitis: Secondary | ICD-10-CM

## 2018-01-11 ENCOUNTER — Other Ambulatory Visit: Payer: Self-pay | Admitting: Family Medicine

## 2018-01-11 DIAGNOSIS — Z1231 Encounter for screening mammogram for malignant neoplasm of breast: Secondary | ICD-10-CM

## 2018-02-26 ENCOUNTER — Other Ambulatory Visit: Payer: Self-pay

## 2018-02-26 ENCOUNTER — Ambulatory Visit (INDEPENDENT_AMBULATORY_CARE_PROVIDER_SITE_OTHER): Payer: PPO | Admitting: Family Medicine

## 2018-02-26 ENCOUNTER — Encounter: Payer: Self-pay | Admitting: Family Medicine

## 2018-02-26 VITALS — BP 132/86 | HR 82 | Temp 98.2°F | Ht 65.0 in | Wt 121.9 lb

## 2018-02-26 DIAGNOSIS — J301 Allergic rhinitis due to pollen: Secondary | ICD-10-CM | POA: Diagnosis not present

## 2018-02-26 DIAGNOSIS — S30861A Insect bite (nonvenomous) of abdominal wall, initial encounter: Secondary | ICD-10-CM

## 2018-02-26 DIAGNOSIS — I10 Essential (primary) hypertension: Secondary | ICD-10-CM

## 2018-02-26 DIAGNOSIS — E785 Hyperlipidemia, unspecified: Secondary | ICD-10-CM | POA: Diagnosis not present

## 2018-02-26 DIAGNOSIS — W57XXXA Bitten or stung by nonvenomous insect and other nonvenomous arthropods, initial encounter: Secondary | ICD-10-CM | POA: Diagnosis not present

## 2018-02-26 DIAGNOSIS — H6981 Other specified disorders of Eustachian tube, right ear: Secondary | ICD-10-CM | POA: Diagnosis not present

## 2018-02-26 DIAGNOSIS — M8580 Other specified disorders of bone density and structure, unspecified site: Secondary | ICD-10-CM

## 2018-02-26 MED ORDER — FLUTICASONE PROPIONATE 50 MCG/ACT NA SUSP: 2.0000 | Freq: Every day | NASAL | 6 refills | Status: DC
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
## 2018-02-26 NOTE — Patient Instructions (Addendum)
If you need something for aches or pains, try to use Tylenol (acetaminophen) instead of non-steroidals (which include Aleve, ibuprofen, Advil, Motrin, and naproxen); non-steroidals can cause long-term kidney damage and raise your blood pressure It's fine with me to take your ezetimibe (cholesterol medicine) with your blood pressure pill Try to limit saturated fats in your diet (bologna, hot dogs, barbeque, cheeseburgers, hamburgers, steak, bacon, sausage, cheese, etc.) and get more fresh fruits, vegetables, and whole grains Practice good posture Try yoga and stretching exercises  Try to limit saturated fats in your diet (bologna, hot dogs, barbeque, cheeseburgers, hamburgers, steak, bacon, sausage, cheese, etc.) and get more fresh fruits, vegetables, and whole grains Use the nasal spray for the inner ear and sinus issue  Bone Health Bones protect organs, store calcium, and anchor muscles. Good health habits, such as eating nutritious foods and exercising regularly, are important for maintaining healthy bones. They can also help to prevent a condition that causes bones to lose density and become weak and brittle (osteoporosis). Why is bone mass important? Bone mass refers to the amount of bone tissue that you have. The higher your bone mass, the stronger your bones. An important step toward having healthy bones throughout life is to have strong and dense bones during childhood. A young adult who has a high bone mass is more likely to have a high bone mass later in life. Bone mass at its greatest it is called peak bone mass. A large decline in bone mass occurs in older adults. In women, it occurs about the time of menopause. During this time, it is important to practice good health habits, because if more bone is lost than what is replaced, the bones will become less healthy and more likely to break (fracture). If you find that you have a low bone mass, you may be able to prevent osteoporosis or further  bone loss by changing your diet and lifestyle. How can I find out if my bone mass is low? Bone mass can be measured with an X-ray test that is called a bone mineral density (BMD) test. This test is recommended for all women who are age 71 or older. It may also be recommended for men who are age 71 or older, or for people who are more likely to develop osteoporosis due to:  Having bones that break easily.  Having a long-term disease that weakens bones, such as kidney disease or rheumatoid arthritis.  Having menopause earlier than normal.  Taking medicine that weakens bones, such as steroids, thyroid hormones, or hormone treatment for breast cancer or prostate cancer.  Smoking.  Drinking three or more alcoholic drinks each day.  What are the nutritional recommendations for healthy bones? To have healthy bones, you need to get enough of the right minerals and vitamins. Most nutrition experts recommend getting these nutrients from the foods that you eat. Nutritional recommendations vary from person to person. Ask your health care provider what is healthy for you. Here are some general guidelines. Calcium Recommendations Calcium is the most important (essential) mineral for bone health. Most people can get enough calcium from their diet, but supplements may be recommended for people who are at risk for osteoporosis. Good sources of calcium include:  Dairy products, such as low-fat or nonfat milk, cheese, and yogurt.  Dark green leafy vegetables, such as bok choy and broccoli.  Calcium-fortified foods, such as orange juice, cereal, bread, soy beverages, and tofu products.  Nuts, such as almonds.  Follow these recommended amounts  for daily calcium intake:  Children, age 39?3: 700 mg.  Children, age 620?8: 1,000 mg.  Children, age 62?13: 1,300 mg.  Teens, age 394?18: 1,300 mg.  Adults, age 399?50: 1,000 mg.  Adults, age 54?70: ? Men: 1,000 mg. ? Women: 1,200 mg.  Adults, age 71 or  older: 1,200 mg.  Pregnant and breastfeeding females: ? Teens: 1,300 mg. ? Adults: 1,000 mg.  Vitamin D Recommendations Vitamin D is the most essential vitamin for bone health. It helps the body to absorb calcium. Sunlight stimulates the skin to make vitamin D, so be sure to get enough sunlight. If you live in a cold climate or you do not get outside often, your health care provider may recommend that you take vitamin D supplements. Good sources of vitamin D in your diet include:  Egg yolks.  Saltwater fish.  Milk and cereal fortified with vitamin D.  Follow these recommended amounts for daily vitamin D intake:  Children and teens, age 39?18: 600 international units.  Adults, age 80 or younger: 400-800 international units.  Adults, age 23 or older: 800-1,000 international units.  Other Nutrients Other nutrients for bone health include:  Phosphorus. This mineral is found in meat, poultry, dairy foods, nuts, and legumes. The recommended daily intake for adult men and adult women is 700 mg.  Magnesium. This mineral is found in seeds, nuts, dark green vegetables, and legumes. The recommended daily intake for adult men is 400?420 mg. For adult women, it is 310?320 mg.  Vitamin K. This vitamin is found in green leafy vegetables. The recommended daily intake is 120 mg for adult men and 90 mg for adult women.  What type of physical activity is best for building and maintaining healthy bones? Weight-bearing and strength-building activities are important for building and maintaining peak bone mass. Weight-bearing activities cause muscles and bones to work against gravity. Strength-building activities increases muscle strength that supports bones. Weight-bearing and muscle-building activities include:  Walking and hiking.  Jogging and running.  Dancing.  Gym exercises.  Lifting weights.  Tennis and racquetball.  Climbing stairs.  Aerobics.  Adults should get at least 30 minutes  of moderate physical activity on most days. Children should get at least 60 minutes of moderate physical activity on most days. Ask your health care provide what type of exercise is best for you. Where can I find more information? For more information, check out the following websites:  National Osteoporosis Foundation: http://burton-owens.org/  [employer]: http://www.niams.http://www.johnson-fowler.biz/.asp  This information is not intended to replace advice given to you by your health care provider. Make sure you discuss any questions you have with your health care provider. Document Released: 01/17/2004 Document Revised: 05/16/2016 Document Reviewed: 11/01/2014 Elsevier Interactive Patient Education  2018 [employer].  Cholesterol Cholesterol is a white, waxy, fat-like substance that is needed by the human body in small amounts. The liver makes all the cholesterol we need. Cholesterol is carried from the liver by the blood through the blood vessels. Deposits of cholesterol (plaques) may build up on blood vessel (artery) walls. Plaques make the arteries narrower and stiffer. Cholesterol plaques increase the risk for heart attack and stroke. You cannot feel your cholesterol level even if it is very high. The only way to know that it is high is to have a blood test. Once you know your cholesterol levels, you should keep a record of the test results. Work with your health care provider to keep your levels in the desired range. What  do the results mean?  Total cholesterol is a rough measure of all the cholesterol in your blood.  LDL (low-density lipoprotein) is the "bad" cholesterol. This is the type that causes plaque to build up on the artery walls. You want this level to be low.  HDL (high-density lipoprotein) is the "good" cholesterol because it cleans the arteries and carries the LDL away. You want this level to be high.  Triglycerides are  fat that the body can either burn for energy or store. High levels are closely linked to heart disease. What are the desired levels of cholesterol?  Total cholesterol below 200.  LDL below 100 for people who are at risk, below 70 for people at very high risk.  HDL above 40 is good. A level of 60 or higher is considered to be protective against heart disease.  Triglycerides below 150. How can I lower my cholesterol? Diet Follow your diet program as told by your health care provider.  Choose fish or white meat chicken and [place], roasted or baked. Limit fatty cuts of red meat, fried foods, and processed meats, such as sausage and lunch meats.  Eat lots of fresh fruits and vegetables.  Choose whole grains, beans, pasta, potatoes, and cereals.  Choose olive oil, corn oil, or canola oil, and use only small amounts.  Avoid butter, mayonnaise, shortening, or palm kernel oils.  Avoid foods with trans fats.  Drink skim or nonfat milk and eat low-fat or nonfat yogurt and cheeses. Avoid whole milk, cream, ice cream, egg yolks, and full-fat cheeses.  Healthier desserts include angel food cake, ginger snaps, animal crackers, hard candy, popsicles, and low-fat or nonfat frozen yogurt. Avoid pastries, cakes, pies, and cookies.  Exercise  Follow your exercise program as told by your health care provider. A regular program: ? Helps to decrease LDL and raise HDL. ? Helps with weight control.  Do things that increase your activity level, such as gardening, walking, and taking the stairs.  Ask your health care provider about ways that you can be more active in your daily life.  Medicine  Take over-the-counter and prescription medicines only as told by your health care provider. ? Medicine may be prescribed by your health care provider to help lower cholesterol and decrease the risk for heart disease. This is usually done if diet and exercise have failed to bring down cholesterol levels. ? If  you have several risk factors, you may need medicine even if your levels are normal.  This information is not intended to replace advice given to you by your health care provider. Make sure you discuss any questions you have with your health care provider. Document Released: 07/22/2001 Document Revised: 05/24/2016 Document Reviewed: 04/26/2016 Elsevier Interactive Patient Education  [employer].

## 2018-02-26 NOTE — Assessment & Plan Note (Signed)
Avoiding decongestants 

## 2018-02-26 NOTE — Assessment & Plan Note (Signed)
Controlled with relaxation; avoid NSAIDs; use tylenol; manage stress; stretching, yoga may be helpful; continue beta-blocker

## 2018-02-26 NOTE — Assessment & Plan Note (Signed)
Weight-bearing exercise; start walking again, start low and build up gradually; almond milk, vit D; fall precautions

## 2018-03-01 ENCOUNTER — Telehealth: Payer: Self-pay

## 2018-03-01 ENCOUNTER — Other Ambulatory Visit: Payer: Self-pay | Admitting: Family Medicine

## 2018-03-01 ENCOUNTER — Ambulatory Visit
Admission: RE | Admit: 2018-03-01 | Discharge: 2018-03-01 | Disposition: A | Discharge status: Home / Self Care | Payer: PPO | Source: Ambulatory Visit | Attending: Family Medicine | Admitting: Family Medicine

## 2018-03-01 DIAGNOSIS — Z1231 Encounter for screening mammogram for malignant neoplasm of breast: Secondary | ICD-10-CM | POA: Insufficient documentation

## 2018-03-01 LAB — LIPID PANEL
CHOL/HDL RATIO: 3.3 (calc) (ref <5.0)
Cholesterol: 183 mg/dL (ref <200)
HDL: 55 mg/dL (ref >50)
LDL CHOLESTEROL (CALC): 109 mg/dL — AB
Non-HDL Cholesterol (Calc): 128 mg/dL (calc) (ref <130)
Triglycerides: 95 mg/dL (ref <150)

## 2018-03-01 LAB — B. BURGDORFI ANTIBODIES

## 2018-03-01 LAB — ROCKY MTN SPOTTED FVR ABS PNL(IGG+IGM)
RMSF IGM: NOT DETECTED
RMSF IgG: NOT DETECTED

## 2018-03-01 NOTE — Telephone Encounter (Signed)
-----   Message from Kerman PasseyMelinda P Lada, MD sent at 02/27/2018 11:52 AM EDT ----- Idamae Schullerkie, please let the patient know that her LDL has come all the way down from 151 to 109. Good job! Keep up the good work!

## 2018-03-01 NOTE — Telephone Encounter (Signed)
Called pt informed her of the information below. Pt gave verbal understanding.  

## 2018-03-02 ENCOUNTER — Telehealth: Payer: Self-pay

## 2018-03-02 NOTE — Telephone Encounter (Signed)
-----   Message from Kerman PasseyMelinda P Lada, MD sent at 03/01/2018  4:43 PM EDT ----- Idamae Schullerkie, please let the patient know that her labs for RMSF and Lyme disease were negative

## 2018-03-02 NOTE — Telephone Encounter (Signed)
Called pt, call dropped. Will call again.

## 2018-03-03 NOTE — Telephone Encounter (Signed)
Called pt informed her of negative results, also informed pt of negative mammo.

## 2018-04-29 ENCOUNTER — Ambulatory Visit (INDEPENDENT_AMBULATORY_CARE_PROVIDER_SITE_OTHER): Payer: PPO

## 2018-04-29 VITALS — BP 124/70 | HR 74 | Temp 97.9°F | Resp 12 | Ht 65.0 in | Wt 117.4 lb

## 2018-04-29 DIAGNOSIS — Z0000 Encounter for general adult medical examination without abnormal findings: Secondary | ICD-10-CM | POA: Diagnosis not present

## 2018-04-29 NOTE — Patient Instructions (Signed)
[name] , Thank you for taking time to come for your Medicare Wellness Visit. I appreciate your ongoing commitment to your health goals. Please review the following plan we discussed and let me know if I can assist you in the future.   Screening recommendations/referrals: Colorectal Screening: Up to date Mammogram: Up to date Bone Density: Up to date  Vision and Dental Exams: Recommended annual ophthalmology exams for early detection of glaucoma and other disorders of the eye Recommended annual dental exams for proper oral hygiene  Vaccinations: Influenza vaccine: Up to date Pneumococcal vaccine: Up to date Tdap vaccine: Up to date Shingles vaccine: Please call your insurance company to determine your out of pocket expense for the Shingrix vaccine. You may also receive this vaccine at your local pharmacy or Health Dept.  Advanced directives: Advance directive discussed with you today. I have provided a copy for you to complete at home and have notarized. Once this is complete please bring a copy in to our office so we can scan it into your chart.  Goals: Recommend to drink at least 6-8 8oz glasses of water per day.  Next appointment: Please schedule your Annual Wellness Visit with your Nurse Health Advisor in one year.  Preventive Care 3065 Years and Older, Female Preventive care refers to lifestyle choices and visits with your health care provider that can promote health and wellness. What does preventive care include?  A yearly physical exam. This is also called an annual well check.  Dental exams once or twice a year.  Routine eye exams. Ask your health care provider how often you should have your eyes checked.  Personal lifestyle choices, including:  Daily care of your teeth and gums.  Regular physical activity.  Eating a healthy diet.  Avoiding tobacco and drug use.  Limiting alcohol use.  Practicing safe sex.  Taking low-dose aspirin every day.  Taking vitamin  and mineral supplements as recommended by your health care provider. What happens during an annual well check? The services and screenings done by your health care provider during your annual well check will depend on your age, overall health, lifestyle risk factors, and family history of disease. Counseling  Your health care provider may ask you questions about your:  Alcohol use.  Tobacco use.  Drug use.  Emotional well-being.  Home and relationship well-being.  Sexual activity.  Eating habits.  History of falls.  Memory and ability to understand (cognition).  Work and work Astronomerenvironment.  Reproductive health. Screening  You may have the following tests or measurements:  Height, weight, and BMI.  Blood pressure.  Lipid and cholesterol levels. These may be checked every 5 years, or more frequently if you are over 71 years old.  Skin check.  Lung cancer screening. You may have this screening every year starting at age 71 if you have a 30-pack-year history of smoking and currently smoke or have quit within the past 15 years.  Fecal occult blood test (FOBT) of the stool. You may have this test every year starting at age 71.  Flexible sigmoidoscopy or colonoscopy. You may have a sigmoidoscopy every 5 years or a colonoscopy every 10 years starting at age 71.  Hepatitis C blood test.  Hepatitis B blood test.  Sexually transmitted disease (STD) testing.  Diabetes screening. This is done by checking your blood sugar (glucose) after you have not eaten for a while (fasting). You may have this done every 1-3 years.  Bone density scan. This is done  to screen for osteoporosis. You may have this done starting at age 66.  Mammogram. This may be done every 1-2 years. Talk to your health care provider about how often you should have regular mammograms. Talk with your health care provider about your test results, treatment options, and if necessary, the need for more  tests. Vaccines  Your health care provider may recommend certain vaccines, such as:  Influenza vaccine. This is recommended every year.  Tetanus, diphtheria, and acellular pertussis (Tdap, Td) vaccine. You may need a Td booster every 10 years.  Zoster vaccine. You may need this after age 24.  Pneumococcal 13-valent conjugate (PCV13) vaccine. One dose is recommended after age 64.  Pneumococcal polysaccharide (PPSV23) vaccine. One dose is recommended after age 47. Talk to your health care provider about which screenings and vaccines you need and how often you need them. This information is not intended to replace advice given to you by your health care provider. Make sure you discuss any questions you have with your health care provider. Document Released: 11/23/2015 Document Revised: 07/16/2016 Document Reviewed: 08/28/2015 Elsevier Interactive Patient Education  2017 [employer] Prevention in the Home Falls can cause injuries. They can happen to people of all ages. There are many things you can do to make your home safe and to help prevent falls. What can I do on the outside of my home?  Regularly fix the edges of walkways and driveways and fix any cracks.  Remove anything that might make you trip as you walk through a door, such as a raised step or threshold.  Trim any bushes or trees on the path to your home.  Use bright outdoor lighting.  Clear any walking paths of anything that might make someone trip, such as rocks or tools.  Regularly check to see if handrails are loose or broken. Make sure that both sides of any steps have handrails.  Any raised decks and porches should have guardrails on the edges.  Have any leaves, snow, or ice cleared regularly.  Use sand or salt on walking paths during winter.  Clean up any spills in your garage right away. This includes oil or grease spills. What can I do in the bathroom?  Use night lights.  Install grab bars by the  toilet and in the tub and shower. Do not use towel bars as grab bars.  Use non-skid mats or decals in the tub or shower.  If you need to sit down in the shower, use a plastic, non-slip stool.  Keep the floor dry. Clean up any water that spills on the floor as soon as it happens.  Remove soap buildup in the tub or shower regularly.  Attach bath mats securely with double-sided non-slip rug tape.  Do not have throw rugs and other things on the floor that can make you trip. What can I do in the bedroom?  Use night lights.  Make sure that you have a light by your bed that is easy to reach.  Do not use any sheets or blankets that are too big for your bed. They should not hang down onto the floor.  Have a firm chair that has side arms. You can use this for support while you get dressed.  Do not have throw rugs and other things on the floor that can make you trip. What can I do in the kitchen?  Clean up any spills right away.  Avoid walking on wet floors.  Keep items  that you use a lot in easy-to-reach places.  If you need to reach something above you, use a strong step stool that has a grab bar.  Keep electrical cords out of the way.  Do not use floor polish or wax that makes floors slippery. If you must use wax, use non-skid floor wax.  Do not have throw rugs and other things on the floor that can make you trip. What can I do with my stairs?  Do not leave any items on the stairs.  Make sure that there are handrails on both sides of the stairs and use them. Fix handrails that are broken or loose. Make sure that handrails are as long as the stairways.  Check any carpeting to make sure that it is firmly attached to the stairs. Fix any carpet that is loose or worn.  Avoid having throw rugs at the top or bottom of the stairs. If you do have throw rugs, attach them to the floor with carpet tape.  Make sure that you have a light switch at the top of the stairs and the bottom of  the stairs. If you do not have them, ask someone to add them for you. What else can I do to help prevent falls?  Wear shoes that:  Do not have high heels.  Have rubber bottoms.  Are comfortable and fit you well.  Are closed at the toe. Do not wear sandals.  If you use a stepladder:  Make sure that it is fully opened. Do not climb a closed stepladder.  Make sure that both sides of the stepladder are locked into place.  Ask someone to hold it for you, if possible.  Clearly mark and make sure that you can see:  Any grab bars or handrails.  First and last steps.  Where the edge of each step is.  Use tools that help you move around (mobility aids) if they are needed. These include:  Canes.  Walkers.  Scooters.  Crutches.  Turn on the lights when you go into a dark area. Replace any light bulbs as soon as they burn out.  Set up your furniture so you have a clear path. Avoid moving your furniture around.  If any of your floors are uneven, fix them.  If there are any pets around you, be aware of where they are.  Review your medicines with your doctor. Some medicines can make you feel dizzy. This can increase your chance of falling. Ask your doctor what other things that you can do to help prevent falls. This information is not intended to replace advice given to you by your health care provider. Make sure you discuss any questions you have with your health care provider. Document Released: 08/23/2009 Document Revised: 04/03/2016 Document Reviewed: 12/01/2014 Elsevier Interactive Patient Education  2017 [employer].

## 2018-04-29 NOTE — Progress Notes (Signed)
Subjective:   Laura Deleon is a 72 y.o. female who presents for Medicare Annual (Subsequent) preventive examination.  Review of Systems:  N/A Cardiac Risk Factors include: advanced age (>62men, >76 women);hypertension;sedentary lifestyle;dyslipidemia     Objective:     Vitals: BP 124/70 (BP Location: Left Arm, Patient Position: Sitting, Cuff Size: Normal)   Pulse 74   Temp 97.9 F (36.6 C) (Oral)   Resp 12   Ht 5\' 5"  (1.651 m)   Wt 117 lb 6.4 oz (53.3 kg)   SpO2 97%   BMI 19.54 kg/m   Body mass index is 19.54 kg/m.  Advanced Directives 04/29/2018 08/27/2017 02/19/2017 12/04/2016 11/14/2016 08/19/2016 01/08/2016  Does Patient Have a Medical Advance Directive? No No No No No Yes No  Does patient want to make changes to medical advance directive? - - - - - No - Patient declined -  Copy of Healthcare Power of Attorney in Chart? - - - - - No - copy requested -  Would patient like information on creating a medical advance directive? Yes (MAU/Ambulatory/Procedural Areas - Information given) - - - - - -    Tobacco Social History   Tobacco Use  Smoking Status Never Smoker  Smokeless Tobacco Never Used  Tobacco Comment   smoking cessation materials not required     Counseling given: No Comment: smoking cessation materials not required  Clinical Intake:  Pre-visit preparation completed: Yes  Pain : No/denies pain   BMI - recorded: 19.54 Nutritional Status: BMI of 19-24  Normal Nutritional Risks: None Diabetes: No  How often do you need to have someone help you when you read instructions, pamphlets, or other written materials from your doctor or pharmacy?: 1 - Never  Interpreter Needed?: No  Information entered by :: AEversole, LPN  Past Medical History:  Diagnosis Date  . Allergy   . Hernia of anterior abdominal wall   . Hyperlipidemia   . Hypertension   . Osteopenia 09/08/2017   Oct 2018; next scan => Sep 10, 2019   Past Surgical History:  Procedure  Laterality Date  . ABDOMINAL HYSTERECTOMY    . HAND SURGERY  11/14/2016   removal of foreign body   Family History  Problem Relation Age of Onset  . Hyperlipidemia Mother   . Hypertension Mother   . Hyperlipidemia Father   . Emphysema Father   . COPD Father   . Breast cancer Neg Hx    Social History   Socioeconomic History  . Marital status: Widowed    Spouse name: Laura Deleon  . Number of children: 2  . Years of education: Not on file  . Highest education level: 12th grade  Occupational History  . Occupation: Retired  Engineer, production  . Financial resource strain: Not hard at all  . Food insecurity:    Worry: Never true    Inability: Never true  . Transportation needs:    Medical: No    Non-medical: No  Tobacco Use  . Smoking status: Never Smoker  . Smokeless tobacco: Never Used  . Tobacco comment: smoking cessation materials not required  Substance and Sexual Activity  . Alcohol use: No    Alcohol/week: 0.0 oz  . Drug use: No  . Sexual activity: Not Currently  Lifestyle  . Physical activity:    Days per week: 0 days    Minutes per session: 0 min  . Stress: Not at all  Relationships  . Social connections:    Talks on phone: Patient  refused    Gets together: Patient refused    Attends religious service: Patient refused    Active member of club or organization: Patient refused    Attends meetings of clubs or organizations: Patient refused    Relationship status: Widowed  Other Topics Concern  . Not on file  Social History Narrative  . Not on file    Outpatient Encounter Medications as of 04/29/2018  Medication Sig  . aspirin 81 MG tablet Take 81 mg by mouth.  . Chlorpheniramine-Acetaminophen (CORICIDIN HBP COLD/FLU PO) Take by mouth as needed.  . conjugated estrogens (PREMARIN) vaginal cream Place vaginally 2 (two) times a week. (use small amount)  . ezetimibe (ZETIA) 10 MG tablet Take 1 tablet (10 mg total) by mouth daily.  . fluticasone (FLONASE) 50 MCG/ACT  nasal spray Place 2 sprays into both nostrils daily.  Marland Kitchen. loratadine (CLARITIN) 10 MG tablet Take 1 tablet (10 mg total) by mouth daily as needed for allergies.  . metoprolol succinate (TOPROL-XL) 50 MG 24 hr tablet Take 1 tablet (50 mg total) by mouth daily. Take with or immediately following a meal.  . Multiple Vitamin (MULTI-VITAMINS) TABS Take 1 tablet by mouth daily.   No facility-administered encounter medications on file as of 04/29/2018.     Activities of Daily Living In your present state of health, do you have any difficulty performing the following activities: 04/29/2018 02/26/2018  Hearing? N N  Comment denies hearing aids -  Vision? N N  Comment wears eyeglasses -  Difficulty concentrating or making decisions? N N  Walking or climbing stairs? N N  Dressing or bathing? N N  Doing errands, shopping? N N  Preparing Food and eating ? N -  Comment full upper dentures -  Using the Toilet? N -  In the past six months, have you accidently leaked urine? Y -  Comment stress incontinence -  Do you have problems with loss of bowel control? N -  Managing your Medications? N -  Managing your Finances? N -  Housekeeping or managing your Housekeeping? N -  Some recent data might be hidden    Patient Care Team: Laura Deleon, Laura BernMelinda P, MD as PCP - General (Family Medicine)    Assessment:   This is a routine wellness examination for Laura SpringsLinda.  Exercise Activities and Dietary recommendations Current Exercise Habits: The patient does not participate in regular exercise at present, Exercise limited by: None identified  Goals    . DIET - INCREASE WATER INTAKE     Recommend to drink at least 6-8 8oz glasses of water per day.       Fall Risk Fall Risk  04/29/2018 02/26/2018 08/27/2017 02/19/2017 12/04/2016  Falls in the past year? No No No No Yes  Number falls in past yr: - - - - 2 or more  Injury with Fall? - - - - Yes  Risk for fall due to : Impaired vision - - - -  Risk for fall due to:  Comment wears eyeglasses - - - -  Follow up - - - - -   FALL RISK PREVENTION PERTAINING TO HOME: Is your home free of loose throw rugs in walkways, pet beds, electrical cords, etc? Yes Is there adequate lighting in your home to reduce risk of falls?  Yes Are there stairs in or around your home WITH handrails? Yes  ASSISTIVE DEVICES UTILIZED TO PREVENT FALLS: Use of a cane, walker or w/c? No Grab bars in the bathroom? No  Shower  chair or a place to sit while bathing? No An elevated toilet seat or a handicapped toilet? No  Timed Get Up and Go Performed: Yes. Pt ambulated 10 feet within 12 sec. Gait slow, steady and without the use of an assistive device. No intervention required at this time. Fall risk prevention has been discussed.  Community Resource Referral:  Pt declined my offer to send [employer] Referral to Care Guide for installation of grab bars in the shower, shower chair or an elevated toilet seat.  Depression Screen PHQ 2/9 Scores 04/29/2018 02/26/2018 08/27/2017 02/19/2017  PHQ - 2 Score 0 0 0 0  PHQ- 9 Score 0 - - -     Cognitive Function     6CIT Screen 04/29/2018  What Year? 0 points  What month? 0 points  What time? 0 points  Count back from 20 0 points  Months in reverse 0 points  Repeat phrase 2 points  Total Score 2    Immunization History  Administered Date(s) Administered  . Influenza, High Dose Seasonal PF 08/19/2016, 08/27/2017  . Influenza,inj,Quad PF,6+ Mos 07/19/2015  . Influenza-Unspecified 07/11/2014  . Pneumococcal Conjugate-13 08/01/2014  . Pneumococcal Polysaccharide-23 02/19/2017  . Tdap 11/23/2007, 11/14/2016    Qualifies for Shingles Vaccine? Yes. Due for Shingrix. Education has been provided regarding the importance of this vaccine. Pt has been advised to call his/her insurance company to determine his/her out of pocket expense. Advised he/she may also receive this vaccine at his/her local pharmacy or Health Dept. Verbalized  acceptance and understanding.  Screening Tests Health Maintenance  Topic Date Due  . INFLUENZA VACCINE  06/10/2018  . COLONOSCOPY  12/11/2018  . MAMMOGRAM  03/02/2019  . DEXA SCAN  09/08/2019  . TETANUS/TDAP  11/14/2026  . Hepatitis C Screening  Completed  . PNA vac Low Risk Adult  Completed    Cancer Screenings: Lung: Low Dose CT Chest recommended if Age 35-80 years, 30 pack-year currently smoking OR have quit w/in 15years. Patient does not qualify. Breast:  Up to date on Mammogram? Yes. Completed 03/01/18. Repeat every year   Up to date of Bone Density/Dexa? Yes. Completed 09/07/17. Repeat every 2 years Colorectal: Completed 12/11/08. Repeat every 10 years  Additional Screenings: Hepatitis C Screening: Completed 10/12/17    Plan:  I have personally reviewed and addressed the Medicare Annual Wellness questionnaire and have noted the following in the patient's chart:  A. Medical and social history B. Use of alcohol, tobacco or illicit drugs  C. Current medications and supplements D. Functional ability and status E.  Nutritional status F.  Physical activity G. Advance directives H. List of other physicians I.  Hospitalizations, surgeries, and ER visits in previous 12 months J.  Vitals K. Screenings such as hearing and vision if needed, cognitive and depression L. Referrals and appointments  In addition, I have reviewed and discussed with patient certain preventive protocols, quality metrics, and best practice recommendations. A written personalized care plan for preventive services as well as general preventive health recommendations were provided to patient.  See attached scanned questionnaire for additional information.   Signed,  Deon Pilling, LPN Nurse Health Advisor

## 2018-07-30 ENCOUNTER — Ambulatory Visit (INDEPENDENT_AMBULATORY_CARE_PROVIDER_SITE_OTHER): Payer: PPO

## 2018-07-30 DIAGNOSIS — Z23 Encounter for immunization: Secondary | ICD-10-CM

## 2018-08-17 ENCOUNTER — Ambulatory Visit
Admission: RE | Admit: 2018-08-17 | Discharge: 2018-08-17 | Disposition: A | Discharge status: Home / Self Care | Payer: PPO | Source: Ambulatory Visit | Attending: Family Medicine | Admitting: Family Medicine

## 2018-08-17 ENCOUNTER — Encounter: Payer: Self-pay | Admitting: Family Medicine

## 2018-08-17 ENCOUNTER — Ambulatory Visit (INDEPENDENT_AMBULATORY_CARE_PROVIDER_SITE_OTHER): Payer: PPO | Admitting: Family Medicine

## 2018-08-17 VITALS — BP 126/74 | HR 81 | Temp 98.0°F | Ht 65.0 in | Wt 116.7 lb

## 2018-08-17 DIAGNOSIS — Z5181 Encounter for therapeutic drug level monitoring: Secondary | ICD-10-CM | POA: Diagnosis not present

## 2018-08-17 DIAGNOSIS — G8929 Other chronic pain: Secondary | ICD-10-CM | POA: Diagnosis not present

## 2018-08-17 DIAGNOSIS — M8580 Other specified disorders of bone density and structure, unspecified site: Secondary | ICD-10-CM

## 2018-08-17 DIAGNOSIS — R079 Chest pain, unspecified: Secondary | ICD-10-CM | POA: Diagnosis not present

## 2018-08-17 DIAGNOSIS — B351 Tinea unguium: Secondary | ICD-10-CM | POA: Diagnosis not present

## 2018-08-17 DIAGNOSIS — G6289 Other specified polyneuropathies: Secondary | ICD-10-CM

## 2018-08-17 DIAGNOSIS — G629 Polyneuropathy, unspecified: Secondary | ICD-10-CM | POA: Insufficient documentation

## 2018-08-17 DIAGNOSIS — M546 Pain in thoracic spine: Secondary | ICD-10-CM | POA: Diagnosis not present

## 2018-08-17 DIAGNOSIS — E785 Hyperlipidemia, unspecified: Secondary | ICD-10-CM | POA: Diagnosis not present

## 2018-08-17 DIAGNOSIS — I10 Essential (primary) hypertension: Secondary | ICD-10-CM | POA: Diagnosis not present

## 2018-08-17 DIAGNOSIS — L84 Corns and callosities: Secondary | ICD-10-CM | POA: Diagnosis not present

## 2018-08-17 DIAGNOSIS — I700 Atherosclerosis of aorta: Secondary | ICD-10-CM | POA: Diagnosis not present

## 2018-08-17 DIAGNOSIS — R739 Hyperglycemia, unspecified: Secondary | ICD-10-CM | POA: Diagnosis not present

## 2018-08-17 DIAGNOSIS — R05 Cough: Secondary | ICD-10-CM | POA: Diagnosis not present

## 2018-08-17 NOTE — Assessment & Plan Note (Signed)
Discussed, option for podiatrist, topical med

## 2018-08-17 NOTE — Assessment & Plan Note (Signed)
Check glucose and A1c 

## 2018-08-17 NOTE — Assessment & Plan Note (Signed)
Check lipids; try to limit saturated fats 

## 2018-08-17 NOTE — Assessment & Plan Note (Addendum)
Starting with work-up, refer to neurology for consideration EMG/NCS, labs

## 2018-08-17 NOTE — Progress Notes (Signed)
BP 126/74   Pulse 81   Temp 98 F (36.7 C) (Oral)   Ht 5\' 5"  (1.651 m)   Wt 116 lb 11.2 oz (52.9 kg)   SpO2 99%   BMI 19.42 kg/m    Subjective:    Patient ID: Laura Deleon, female    DOB: Mar 11, 1946, 72 y.o.   MRN: [id number]  HPI: Laura Deleon is a 72 y.o. female  Chief Complaint  Patient presents with  . Follow-up    HPI Patient is here for f/u She is staying busy with the grandkids; she is not really doing any exercise; she has bad feet; not sure what's wrong; she has high arches and they were going to break the bones and flatten her feet, but she "chickened out"; she has done the insoles, did not like any of that; not too bad right now; not much cushion and calluses; she was exposed to broken light fixture years ago; hands are fine  Hyperglycemia; one glucose 103, last glucose 96; not much dry mouth  High cholesterol; total chol 232 and LDL 151; she does eat cheese "I have a weakness, I probably eat too much cheese;" occasional hamburger; lots of pasta or pizza  HTN; controlled today  Aortic athero; not obese  Osteopenia; last DEXA was October 2018; discussed calcium in diet  Dull hurt in the right shoulder, upper back area, right where she had a benign lump removed  Depression screen [facility] 2/9 08/17/2018 08/17/2018 04/29/2018 02/26/2018 08/27/2017  Decreased Interest 0 0 0 0 0  Down, Depressed, Hopeless 0 0 0 0 0  PHQ - 2 Score 0 0 0 0 0  Altered sleeping 0 - 0 - -  Tired, decreased energy 0 - 0 - -  Change in appetite 0 - 0 - -  Feeling bad or failure about yourself  0 - 0 - -  Trouble concentrating 0 - 0 - -  Moving slowly or fidgety/restless 0 - 0 - -  Suicidal thoughts 0 - 0 - -  PHQ-9 Score 0 - 0 - -  Difficult doing work/chores Not difficult at all - Not difficult at all - -   Fall Risk  08/17/2018 04/29/2018 02/26/2018 08/27/2017 02/19/2017  Falls in the past year? No No No No No  Number falls in past yr: - - - - -  Injury with Fall? - - - - -  Risk for  fall due to : - Impaired vision - - -  Risk for fall due to: Comment - wears eyeglasses - - -  Follow up - - - - -    Relevant past medical, surgical, family and social history reviewed Past Medical History:  Diagnosis Date  . Allergy   . Hernia of anterior abdominal wall   . Hyperlipidemia   . Hypertension   . Osteopenia 09/08/2017   Oct 2018; next scan => Sep 10, 2019   Past Surgical History:  Procedure Laterality Date  . ABDOMINAL HYSTERECTOMY    . HAND SURGERY  11/14/2016   removal of foreign body   Family History  Problem Relation Age of Onset  . Hyperlipidemia Mother   . Hypertension Mother   . Hyperlipidemia Father   . Emphysema Father   . COPD Father   . Breast cancer Neg Hx    Social History   Tobacco Use  . Smoking status: Never Smoker  . Smokeless tobacco: Never Used  . Tobacco comment: smoking cessation materials not required  Substance Use Topics  . Alcohol use: No    Alcohol/week: 0.0 standard drinks  . Drug use: No     [facility] Visit from 08/17/2018 in [facility]  AUDIT-C Score  0      Interim medical history since last visit reviewed. Allergies and medications reviewed  Review of Systems Per HPI unless specifically indicated above     Objective:    BP 126/74   Pulse 81   Temp 98 F (36.7 C) (Oral)   Ht 5\' 5"  (1.651 m)   Wt 116 lb 11.2 oz (52.9 kg)   SpO2 99%   BMI 19.42 kg/m   Wt Readings from Last 3 Encounters:  08/17/18 116 lb 11.2 oz (52.9 kg)  04/29/18 117 lb 6.4 oz (53.3 kg)  02/26/18 121 lb 14.4 oz (55.3 kg)    Physical Exam  Constitutional: She appears well-developed and well-nourished. No distress.  HENT:  Head: Normocephalic and atraumatic.  Eyes: EOM are normal. No scleral icterus.  Neck: No thyromegaly present.  Cardiovascular: Normal rate, regular rhythm and normal heart sounds.  No murmur heard. Pulmonary/Chest: Effort normal and breath sounds normal. No respiratory distress. She has no  wheezes.  Abdominal: Soft. Bowel sounds are normal. She exhibits no distension.  Musculoskeletal: She exhibits no edema.  Neurological: She is alert.  Diminished sensation bilateral feet distally  Skin: Skin is warm and dry. She is not diaphoretic. No pallor.  Varicose veins in the legs, spider veins; calluses on soles on both feet; thickened great toenails  Psychiatric: She has a normal mood and affect. Her behavior is normal. Judgment and thought content normal. Her mood appears not anxious. She does not exhibit a depressed mood.    Results for orders placed or performed in visit on 02/26/18  B. Burgdorfi Antibodies  Result Value Ref Range   B burgdorferi Ab IgG+IgM <0.90 index  Lipid panel  Result Value Ref Range   Cholesterol 183 <200 mg/dL   HDL 55 >16 mg/dL   Triglycerides 95 <109 mg/dL   LDL Cholesterol (Calc) 109 (H) mg/dL (calc)   Total CHOL/HDL Ratio 3.3 <5.0 (calc)   Non-HDL Cholesterol (Calc) 128 <130 mg/dL (calc)  Rocky mtn spotted fvr abs pnl(IgG+IgM)  Result Value Ref Range   RMSF IgG NOT DETECTED NOT DETECT   RMSF IgM NOT DETECTED NOT DETECT      Assessment & Plan:   Problem List Items Addressed This Visit      Cardiovascular and Mediastinum   Hypertension goal BP (blood pressure) < 140/90 (Chronic)    Well-controlled; try to follow DASH guidelines      Calcification of abdominal aorta (HCC)    Goal LDL is less than 70        Nervous and Auditory   Peripheral neuropathy    Starting with work-up, refer to neurology for consideration EMG/NCS, labs      Relevant Orders   Ambulatory referral to Neurology   TSH   Heavy Metals Profile, Urine   Vitamin B12     Musculoskeletal and Integument   Osteopenia    Next DEXA September 10, 2019; calcium in food is best      Onychomycosis    Discussed, option for podiatrist, topical med        Other   Encounter for medication monitoring - Primary   Relevant Orders   COMPLETE METABOLIC PANEL WITH GFR    Hyperlipidemia LDL goal <70    Check lipids; try to limit saturated  fats      Relevant Orders   Lipid panel   Foot callus    Pressure relief; already saw podiatrist; patient politely declined another referral      Blood glucose elevated (Chronic)    Check glucose and A1c      Relevant Orders   Hemoglobin A1c    Other Visit Diagnoses    Chronic right-sided thoracic back pain       Relevant Orders   DG Chest 2 View       Follow up plan: No follow-ups on file.  An after-visit summary was printed and given to the patient at check-out.  Please see the patient instructions which may contain other information and recommendations beyond what is mentioned above in the assessment and plan.  No orders of the defined types were placed in this encounter.   Orders Placed This Encounter  Procedures  . DG Chest 2 View  . TSH  . Heavy Metals Profile, Urine  . Vitamin B12  . COMPLETE METABOLIC PANEL WITH GFR  . Hemoglobin A1c  . Lipid panel  . Ambulatory referral to Neurology

## 2018-08-17 NOTE — Assessment & Plan Note (Signed)
Next DEXA September 10, 2019; calcium in food is best

## 2018-08-17 NOTE — Assessment & Plan Note (Signed)
Pressure relief; already saw podiatrist; patient politely declined another referral

## 2018-08-17 NOTE — Assessment & Plan Note (Signed)
Well-controlled; try to follow DASH guidelines 

## 2018-08-17 NOTE — Assessment & Plan Note (Signed)
Goal LDL is less than 70 

## 2018-08-18 ENCOUNTER — Other Ambulatory Visit: Payer: Self-pay | Admitting: Family Medicine

## 2018-08-18 LAB — HEMOGLOBIN A1C
EAG (MMOL/L): 6.2 (calc)
Hgb A1c MFr Bld: 5.5 % of total Hgb (ref <5.7)
Mean Plasma Glucose: 111 (calc)

## 2018-08-18 LAB — COMPLETE METABOLIC PANEL WITH GFR
AG RATIO: 1.7 (calc) (ref 1.0–2.5)
ALKALINE PHOSPHATASE (APISO): 62 U/L (ref 33–130)
ALT: 13 U/L (ref 6–29)
AST: 23 U/L (ref 10–35)
Albumin: 4.4 g/dL (ref 3.6–5.1)
BILIRUBIN TOTAL: 0.6 mg/dL (ref 0.2–1.2)
BUN: 13 mg/dL (ref 7–25)
CALCIUM: 9.5 mg/dL (ref 8.6–10.4)
CHLORIDE: 97 mmol/L — AB (ref 98–110)
CO2: 29 mmol/L (ref 20–32)
Creat: 0.62 mg/dL (ref 0.60–0.93)
GFR, Est African American: 105 mL/min/{1.73_m2} (ref >=60)
GFR, Est Non African American: 91 mL/min/{1.73_m2} (ref >=60)
Globulin: 2.6 g/dL (calc) (ref 1.9–3.7)
Glucose, Bld: 95 mg/dL (ref 65–99)
POTASSIUM: 4.1 mmol/L (ref 3.5–5.3)
Sodium: 133 mmol/L — ABNORMAL LOW (ref 135–146)
Total Protein: 7 g/dL (ref 6.1–8.1)

## 2018-08-18 LAB — LIPID PANEL
Cholesterol: 198 mg/dL (ref <200)
HDL: 56 mg/dL (ref >50)
LDL Cholesterol (Calc): 120 mg/dL (calc) — ABNORMAL HIGH
Non-HDL Cholesterol (Calc): 142 mg/dL (calc) — ABNORMAL HIGH (ref <130)
Total CHOL/HDL Ratio: 3.5 (calc) (ref <5.0)
Triglycerides: 112 mg/dL (ref <150)

## 2018-08-18 LAB — TSH: TSH: 0.92 m[IU]/L (ref 0.40–4.50)

## 2018-08-18 LAB — VITAMIN B12: Vitamin B-12: 659 pg/mL (ref 200–1100)

## 2018-08-18 MED ORDER — ROSUVASTATIN CALCIUM 5 MG PO TABS: 5.0000 mg | ORAL_TABLET | Freq: Every day | ORAL | 3 refills | Status: DC

## 2018-08-18 NOTE — Progress Notes (Signed)
Add crestor 5 to zetia 10

## 2018-08-19 ENCOUNTER — Telehealth: Payer: Self-pay

## 2018-08-19 DIAGNOSIS — E785 Hyperlipidemia, unspecified: Secondary | ICD-10-CM

## 2018-08-19 NOTE — Telephone Encounter (Signed)
-----   Message from Kerman Passey, MD sent at 08/18/2018  5:54 PM EDT ----- [name], please let the patient know that her thyroid test is normal Her vitamin B12 is normal Her sodium and chloride are just a little blow normal; if she drinks excessive amounts of water or other liquids, cut back some Her 3 month blood sugar average is normal, so she doesn't have diabetes Her cholesterol panel shows room for improvement; she had side effects from lipitor; Laura'd like to try low dose crestor in addition to the zetia to see if we can improve this; her risk of having a heart attack is high enough that we need to do something; ORDER lipid panel to be done in 6 weeks Thank you  The 10-year ASCVD risk score Denman George DC Montez Hageman., et al., 2013) is: 13.5%   Values used to calculate the score:     Age: 72 years     Sex: Female     Is Non-Hispanic African American: No     Diabetic: No     Tobacco smoker: No     Systolic Blood Pressure: 126 mmHg     Is BP treated: Yes     HDL Cholesterol: 56 mg/dL     Total Cholesterol: 198 mg/dL

## 2018-08-20 ENCOUNTER — Other Ambulatory Visit: Payer: Self-pay | Admitting: Family Medicine

## 2018-11-18 ENCOUNTER — Other Ambulatory Visit: Payer: Self-pay | Admitting: Family Medicine

## 2019-02-03 ENCOUNTER — Encounter: Payer: Self-pay | Admitting: Family Medicine

## 2019-02-03 ENCOUNTER — Ambulatory Visit (INDEPENDENT_AMBULATORY_CARE_PROVIDER_SITE_OTHER): Payer: PPO | Admitting: Family Medicine

## 2019-02-03 VITALS — BP 153/78 | HR 86 | Temp 97.5°F

## 2019-02-03 DIAGNOSIS — G43109 Migraine with aura, not intractable, without status migrainosus: Secondary | ICD-10-CM

## 2019-02-03 DIAGNOSIS — R29818 Other symptoms and signs involving the nervous system: Secondary | ICD-10-CM | POA: Diagnosis not present

## 2019-02-03 DIAGNOSIS — I10 Essential (primary) hypertension: Secondary | ICD-10-CM | POA: Diagnosis not present

## 2019-02-03 MED ORDER — ASPIRIN EC 325 MG PO TBEC: 325.0000 mg | DELAYED_RELEASE_TABLET | Freq: Every day | ORAL | 0 refills | Status: DC

## 2019-02-03 NOTE — Progress Notes (Signed)
Name: Laura Deleon   MRN: [id number]    DOB: 11-02-1946   Date:02/03/2019       Progress Note  Subjective  Chief Complaint  Chief Complaint  Patient presents with  . Headache  . Migraine    I connected with@ on 02/03/19 at  3:00 PM EDT by a video enabled telemedicine application and verified that I am speaking with the correct person using two identifiers.  I discussed the limitations of evaluation and management by telemedicine and the availability of in person appointments. The patient expressed understanding and agreed to proceed. Pt is at home and I am in office for phone call today which is performed due to COVID19 pandemic.  Patient is at daughter's home Inetta Fermo and her granddaugther Helmut Muster were there with her I am at [facility]  HPI  HTN: patient is at home, she has a bp cuff and initially bp was 182/98, pulse 86, she took her medications and during our video visit she rechecked her bp and it went to 153/78   Migraine headaches: she has a long history of migraines , started with scotomas , followed by right hand numbness and also numbness on her tongue. She also got slightly confused and had aphasia also  She also states the entire episode lasted about 15 minutes, she states since then she has noticed a mild dull ache on frontal area, she denies associated nausea or vomiting. She did have chills. Episode happened hours ago while she was at home with her 39 yo grandson. She was able to call her daughter by herself to ask for help. She states her previous migraine episodes were exactly the same. She was evaluated by neurologist in the past and had CT scans.    Patient Active Problem List   Diagnosis Date Noted  . Onychomycosis 08/17/2018  . Peripheral neuropathy 08/17/2018  . Foot callus 08/17/2018  . Contracture of joint of finger of right hand 11/11/2017  . Osteopenia 09/08/2017  . Postmenopausal 08/27/2017  . Gallstone 12/16/2016  . Calcification of abdominal  aorta (HCC) 12/16/2016  . Laceration of deep palmar arch of left hand 11/14/2016  . Encounter for medication monitoring 08/19/2016  . Abdominal wall hernia 07/19/2015  . Allergic rhinitis, seasonal 07/19/2015  . Blood glucose elevated 07/19/2015  . Hypertension goal BP (blood pressure) < 140/90 07/19/2015  . 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  . Left-sided thoracic back pain 07/19/2015  . Hyperlipidemia LDL goal <70 07/19/2015    Past Surgical History:  Procedure Laterality Date  . ABDOMINAL HYSTERECTOMY    . HAND SURGERY  11/14/2016   removal of foreign body    Family History  Problem Relation Age of Onset  . Hyperlipidemia Mother   . Hypertension Mother   . Hyperlipidemia Father   . Emphysema Father   . COPD Father   . Breast cancer Neg Hx     Social History   Socioeconomic History  . Marital status: Widowed    Spouse name: Faylene Million  . Number of children: 2  . Years of education: Not on file  . Highest education level: 12th grade  Occupational History  . Occupation: Retired  Engineer, production  . Financial resource strain: Not hard at all  . Food insecurity:    Worry: Never true    Inability: Never true  . Transportation needs:    Medical: No    Non-medical: No  Tobacco Use  . Smoking status: Never Smoker  . Smokeless tobacco: Never Used  .  Tobacco comment: smoking cessation materials not required  Substance and Sexual Activity  . Alcohol use: No    Alcohol/week: 0.0 standard drinks  . Drug use: No  . Sexual activity: Not Currently  Lifestyle  . Physical activity:    Days per week: 0 days    Minutes per session: 0 min  . Stress: Not at all  Relationships  . Social connections:    Talks on phone: Patient refused    Gets together: Patient refused    Attends religious service: Patient refused    Active member of club or organization: Patient refused    Attends meetings of clubs or organizations: Patient refused    Relationship status: Widowed  . Intimate partner  violence:    Fear of current or ex partner: No    Emotionally abused: No    Physically abused: No    Forced sexual activity: No  Other Topics Concern  . Not on file  Social History Narrative  . Not on file     Current Outpatient Medications:  .  ezetimibe (ZETIA) 10 MG tablet, TAKE 1 TABLET BY MOUTH DAILY, Disp: 90 tablet, Rfl: 3 .  fluticasone (FLONASE) 50 MCG/ACT nasal spray, Place 2 sprays into both nostrils daily. (Patient taking differently: Place 2 sprays into both nostrils as needed. ), Disp: 16 g, Rfl: 6 .  loratadine (CLARITIN) 10 MG tablet, Take 1 tablet (10 mg total) by mouth daily as needed for allergies., Disp: 90 tablet, Rfl: 3 .  metoprolol succinate (TOPROL-XL) 50 MG 24 hr tablet, TAKE 1 TABLET BY MOUTH DAILY WITH OR IMMEDIATELY FOLLOWING A MEAL, Disp: 90 tablet, Rfl: 3 .  Multiple Vitamin (MULTI-VITAMINS) TABS, Take 1 tablet by mouth daily., Disp: , Rfl:  .  aspirin EC 325 MG tablet, Take 1 tablet (325 mg total) by mouth daily., Disp: 30 tablet, Rfl: 0 .  conjugated estrogens (PREMARIN) vaginal cream, Place vaginally 2 (two) times a week. (use small amount) (Patient not taking: Reported on 02/03/2019), Disp: 30 g, Rfl: 1 .  rosuvastatin (CRESTOR) 5 MG tablet, Take 1 tablet (5 mg total) by mouth daily. (Patient not taking: Reported on 02/03/2019), Disp: 90 tablet, Rfl: 3  Allergies  Allergen Reactions  . Sulfa Antibiotics Shortness Of Breath  . Latex     I personally reviewed active problem list, medication list, allergies, family history with the patient/caregiver today.   ROS   Ten systems reviewed and is negative except as mentioned in HPI   Objective  Virtual encounter, vitals not obtained.  There is no height or weight on file to calculate BMI.  Physical Exam  Constitutional: Patient appears well-developed and well-nourished. No distress Neurological exam: done with video and assistance of granddaughter normal cranial nerves, romberg negative, normal  grip, gait stable Normal judgement, able to tell me the history without any assistance   PHQ2/9: [facility] 02/03/2019 08/17/2018 08/17/2018 04/29/2018 02/26/2018  Decreased Interest 0 0 0 0 0  Down, Depressed, Hopeless 3 0 0 0 0  PHQ - 2 Score 3 0 0 0 0  Altered sleeping 3 0 - 0 -  Tired, decreased energy 0 0 - 0 -  Change in appetite 0 0 - 0 -  Feeling bad or failure about yourself  0 0 - 0 -  Trouble concentrating 0 0 - 0 -  Moving slowly or fidgety/restless 0 0 - 0 -  Suicidal thoughts 0 0 - 0 -  PHQ-9 Score 6 0 - 0 -  Difficult doing work/chores Not difficult at all Not difficult at all - Not difficult at all -   PHQ-2/9 Result is negative.  She is stressed because of problems with her well pump breaking, also broken refrigerator   Fall Risk: Fall Risk  08/17/2018 04/29/2018 02/26/2018 08/27/2017 02/19/2017  Falls in the past year? No No No No No  Number falls in past yr: - - - - -  Injury with Fall? - - - - -  Risk for fall due to : - Impaired vision - - -  Risk for fall due to: Comment - wears eyeglasses - - -  Follow up - - - - -     Assessment & Plan  1. Hypertension goal BP (blood pressure) < 140/90  Continue medication   2. Migraine with aura and without status migrainosus, not intractable  Per patient same symptoms as she had in the past   3. Neurological deficit, transient  She states same symptoms as previous migraines, explained because of her age, hyperlipidemia and hypertension it could be a mini stroke, but because of COVID-19 , the fact that symptoms have resolved,  we will try increasing aspirin to 325 mg daily , monitor bp and close follow up with Dr. Sherie Don. Also advised her and her daughter to call 911 if symptoms returns or any form of neuro deficit. She will stay with her daughter until seen by Dr. Sherie Don  I discussed the assessment and treatment plan with the patient. The patient was provided an opportunity to ask questions and all were  answered. The patient agreed with the plan and demonstrated an understanding of the instructions.  The patient was advised to call back or seek an in-person evaluation if the symptoms worsen or if the condition fails to improve as anticipated.  I provided 25 minutes of non-face-to-face time during this encounter.

## 2019-02-17 ENCOUNTER — Other Ambulatory Visit: Payer: Self-pay

## 2019-02-17 ENCOUNTER — Encounter: Payer: Self-pay | Admitting: Family Medicine

## 2019-02-17 ENCOUNTER — Ambulatory Visit (INDEPENDENT_AMBULATORY_CARE_PROVIDER_SITE_OTHER): Payer: PPO | Admitting: Family Medicine

## 2019-02-17 ENCOUNTER — Telehealth: Payer: Self-pay | Admitting: Family Medicine

## 2019-02-17 DIAGNOSIS — I10 Essential (primary) hypertension: Secondary | ICD-10-CM

## 2019-02-17 DIAGNOSIS — Z8673 Personal history of transient ischemic attack (TIA), and cerebral infarction without residual deficits: Secondary | ICD-10-CM | POA: Diagnosis not present

## 2019-02-17 DIAGNOSIS — G43109 Migraine with aura, not intractable, without status migrainosus: Secondary | ICD-10-CM | POA: Diagnosis not present

## 2019-02-17 DIAGNOSIS — E785 Hyperlipidemia, unspecified: Secondary | ICD-10-CM

## 2019-02-17 MED ORDER — ATORVASTATIN CALCIUM 10 MG PO TABS: 10.0000 mg | ORAL_TABLET | Freq: Every day | ORAL | 1 refills | Status: DC

## 2019-02-17 MED ORDER — LORAZEPAM 0.5 MG PO TABS: ORAL_TABLET | ORAL | 0 refills | Status: DC

## 2019-02-17 NOTE — Progress Notes (Signed)
There were no vitals taken for this visit.   Subjective:    Patient ID: Laura Deleon, female    DOB: 05-Feb-1946, 73 y.o.   MRN: [id number]  HPI: Laura Deleon is a 73 y.o. female  Chief Complaint  Patient presents with  . Follow-up    HPI Virtual Visit via Telephone/Video Note   I connect with the patient via: FaceTime I verified that I am speaking with the correct person using two identifiers.  Call started: 9:08 am Call terminated: 9:33 am  Total length of call: 25 minutes   I discussed the limitations, risks, security, and privacy concerns of performing an evaluation and management service by telephone and the availability of in-person appointments. Staff discussed with the patient that he/she may be responsible for charges related to this service. The patient expressed understanding and agreed to proceed.  Patient location: home Provider location: home Additional participants: grandson, 84 years old, okay per patient  She was seen by Dr. Carlynn Purl on February 03, 2019 for high blood pressure; remote televisit Daughter checked her BP; it was 153/78 that day after taking her medicines She has not had her BP checked since that visit She had a spell, maybe a mini-stroke she was having; patient does not think it was She got over it too quick The spell lasted about 15 minutes, "not super long, you know" It was like she thought she was having a "cluster migraine" She has had those before and it was the same symptoms, however, she thinks she had a panic attack with it Her tongue got numb; did have trouble speaking, did not sound like it was her talking Had the flashing lights like a zigzag  She has had those many times before She thinks that was all stress that did this Even the doctor thought that too It was more than she could bear Her well went dry; didn't have any water; needed a new pump; didn't have the $$$; then her rerigerator broke and "then the virus thing came out;  then bam bam bam bam, more than I could bear" She is trying to do happy things; always has someone with her; children are old enough to call 911 and use computer Now taking two aspirin a day, two 81 mg daily aspirin I feel good Memory has been slowly getting worse, nothing acute that day with the spell  High cholesterol; she has been prescribed zetia and crestor; she is not taking rosuvastatin; makes her feel weird; no muscle aching Lab Results  Component Value Date   CHOL 198 08/17/2018   HDL 56 08/17/2018   LDLCALC 120 (H) 08/17/2018   TRIG 112 08/17/2018   CHOLHDL 3.5 08/17/2018     Depression screen [facility] 2/9 02/17/2019 02/03/2019 08/17/2018 08/17/2018 04/29/2018  Decreased Interest 0 0 0 0 0  Down, Depressed, Hopeless 0 3 0 0 0  PHQ - 2 Score 0 3 0 0 0  Altered sleeping 0 3 0 - 0  Tired, decreased energy 0 0 0 - 0  Change in appetite 0 0 0 - 0  Feeling bad or failure about yourself  0 0 0 - 0  Trouble concentrating 0 0 0 - 0  Moving slowly or fidgety/restless 0 0 0 - 0  Suicidal thoughts 0 0 0 - 0  PHQ-9 Score 0 6 0 - 0  Difficult doing work/chores Not difficult at all Not difficult at all Not difficult at all - Not difficult at all  Fall Risk  02/17/2019 08/17/2018 04/29/2018 02/26/2018 08/27/2017  Falls in the past year? 0 No No No No  Number falls in past yr: - - - - -  Injury with Fall? - - - - -  Risk for fall due to : - - Impaired vision - -  Risk for fall due to: Comment - - wears eyeglasses - -  Follow up - - - - -    Relevant past medical, surgical, family and social history reviewed Past Medical History:  Diagnosis Date  . Allergy   . Hernia of anterior abdominal wall   . Hyperlipidemia   . Hypertension   . Osteopenia 09/08/2017   Oct 2018; next scan => Sep 10, 2019   Past Surgical History:  Procedure Laterality Date  . ABDOMINAL HYSTERECTOMY    . HAND SURGERY  11/14/2016   removal of foreign body   Family History  Problem Relation Age of Onset  .  Hyperlipidemia Mother   . Hypertension Mother   . Hyperlipidemia Father   . Emphysema Father   . COPD Father   . Breast cancer Neg Hx    Social History   Tobacco Use  . Smoking status: Never Smoker  . Smokeless tobacco: Never Used  . Tobacco comment: smoking cessation materials not required  Substance Use Topics  . Alcohol use: No    Alcohol/week: 0.0 standard drinks  . Drug use: No     Office Visit from 02/17/2019 in [facility]  AUDIT-C Score  1      Interim medical history since last visit reviewed. Allergies and medications reviewed  Review of Systems Per HPI unless specifically indicated above     Objective:    There were no vitals taken for this visit.  Wt Readings from Last 3 Encounters:  08/17/18 116 lb 11.2 oz (52.9 kg)  04/29/18 117 lb 6.4 oz (53.3 kg)  02/26/18 121 lb 14.4 oz (55.3 kg)    Physical Exam Constitutional:      General: She is not in acute distress. Pulmonary:     Effort: No respiratory distress.  Neurological:     Mental Status: She is alert.     Cranial Nerves: No dysarthria or facial asymmetry.     Motor: No weakness.     Comments: Grip strength appeared intact, tight closed fist bilaterally; raised each arm completely above head; hip flexors / leg extension full visually against gravity; speech clear, no slurring  Psychiatric:        Speech: Speech is not rapid and pressured, delayed or slurred.        Cognition and Memory: Cognition and memory normal.     Results for orders placed or performed in visit on 08/17/18  TSH  Result Value Ref Range   TSH 0.92 0.40 - 4.50 mIU/L  Vitamin B12  Result Value Ref Range   Vitamin B-12 659 200 - 1,100 pg/mL  COMPLETE METABOLIC PANEL WITH GFR  Result Value Ref Range   Glucose, Bld 95 65 - 99 mg/dL   BUN 13 7 - 25 mg/dL   Creat 8.65 7.84 - 6.96 mg/dL   GFR, Est Non African American 91 > OR = 60 mL/min/1.13m2   GFR, Est African American 105 > OR = 60 mL/min/1.34m2    BUN/Creatinine Ratio NOT APPLICABLE 6 - 22 (calc)   Sodium 133 (L) 135 - 146 mmol/L   Potassium 4.1 3.5 - 5.3 mmol/L   Chloride 97 (L) 98 - 110  mmol/L   CO2 29 20 - 32 mmol/L   Calcium 9.5 8.6 - 10.4 mg/dL   Total Protein 7.0 6.1 - 8.1 g/dL   Albumin 4.4 3.6 - 5.1 g/dL   Globulin 2.6 1.9 - 3.7 g/dL (calc)   AG Ratio 1.7 1.0 - 2.5 (calc)   Total Bilirubin 0.6 0.2 - 1.2 mg/dL   Alkaline phosphatase (APISO) 62 33 - 130 U/L   AST 23 10 - 35 U/L   ALT 13 6 - 29 U/L  Hemoglobin A1c  Result Value Ref Range   Hgb A1c MFr Bld 5.5 <5.7 % of total Hgb   Mean Plasma Glucose 111 (calc)   eAG (mmol/L) 6.2 (calc)  Lipid panel  Result Value Ref Range   Cholesterol 198 <200 mg/dL   HDL 56 >16>50 mg/dL   Triglycerides 109112 <604<150 mg/dL   LDL Cholesterol (Calc) 120 (H) mg/dL (calc)   Total CHOL/HDL Ratio 3.5 <5.0 (calc)   Non-HDL Cholesterol (Calc) 142 (H) <130 mg/dL (calc)      Assessment & Plan:   Problem List Items Addressed This Visit      Cardiovascular and Mediastinum   Hypertension goal BP (blood pressure) < 140/90 (Chronic)    I asked patient to have family check her BP and contact me; goal is less than 140/90; limiting stress and trying to stay happy      Relevant Medications   atorvastatin (LIPITOR) 10 MG tablet     Other   Hyperlipidemia LDL goal <70    Continue zetia, don't take rosvuatatin; try atorvastatin instead; check lipids in 8 weeks after COVID-19 peaks and goes      Relevant Medications   atorvastatin (LIPITOR) 10 MG tablet   Hx of transient ischemic attack (TIA)    Possible TIA; will get carotid and brain MRI, call 911 if recurrent symptoms; start another statin; 325 mg aspirin      Relevant Orders   US Carotid Bilateral   MR Brain Wo Contrast    Other Visit Diagnoses    Migraine with aura and without status migrainosus, not intractable    -  Primary   hard to know if migraine (with the zigzag, flashing lights) or TIA; agree with aspirin but okay to take 324 mg  together; she will call 911 in the future   Relevant Medications   atorvastatin (LIPITOR) 10 MG tablet   Other Relevant Orders   US Carotid Bilateral       Follow up plan: Return in about 2 months (around 04/19/2019) for follow-up visit with Dr. Sherie DonLada.  An after-visit summary was printed and given to the patient at check-out.  Please see the patient instructions which may contain other information and recommendations beyond what is mentioned above in the assessment and plan.  Meds ordered this encounter  Medications  . atorvastatin (LIPITOR) 10 MG tablet    Sig: Take 1 tablet (10 mg total) by mouth at bedtime. For cholesterol; do NOT take rosuvastatin    Dispense:  90 tablet    Refill:  1  . LORazepam (ATIVAN) 0.5 MG tablet    Sig: Take one pill one hour prior to MRI; may repeat one more pill 30 minutes after the 1st pill if needed; do not drive for 6 hours    Dispense:  2 tablet    Refill:  0    Orders Placed This Encounter  Procedures  . US Carotid Bilateral  . MR Brain Wo Contrast

## 2019-02-17 NOTE — Telephone Encounter (Signed)
Patient needs an appointment for follow-up with me in two months Dx: TIA versus migraine In person appointment is fine for now and we'll see where the COVID-19 pandemic stands at that time

## 2019-02-17 NOTE — Assessment & Plan Note (Signed)
Possible TIA; will get carotid and brain MRI, call 911 if recurrent symptoms; start another statin; 325 mg aspirin

## 2019-02-17 NOTE — Assessment & Plan Note (Signed)
Continue zetia, don't take rosvuatatin; try atorvastatin instead; check lipids in 8 weeks after COVID-19 peaks and goes

## 2019-02-17 NOTE — Assessment & Plan Note (Addendum)
I asked patient to have family check her BP and contact me; goal is less than 140/90; limiting stress and trying to stay happy

## 2019-02-18 NOTE — Telephone Encounter (Signed)
appt made on the same day as her medicare wellness

## 2019-03-30 ENCOUNTER — Other Ambulatory Visit: Payer: Self-pay

## 2019-03-30 ENCOUNTER — Ambulatory Visit
Admission: RE | Admit: 2019-03-30 | Discharge: 2019-03-30 | Disposition: A | Discharge status: Home / Self Care | Payer: PPO | Source: Ambulatory Visit | Attending: Family Medicine | Admitting: Family Medicine

## 2019-03-30 DIAGNOSIS — G43109 Migraine with aura, not intractable, without status migrainosus: Secondary | ICD-10-CM | POA: Diagnosis not present

## 2019-03-30 DIAGNOSIS — I6523 Occlusion and stenosis of bilateral carotid arteries: Secondary | ICD-10-CM | POA: Diagnosis not present

## 2019-03-30 DIAGNOSIS — Z8673 Personal history of transient ischemic attack (TIA), and cerebral infarction without residual deficits: Secondary | ICD-10-CM | POA: Diagnosis not present

## 2019-04-19 ENCOUNTER — Ambulatory Visit
Admission: RE | Admit: 2019-04-19 | Discharge: 2019-04-19 | Disposition: A | Discharge status: Home / Self Care | Payer: PPO | Source: Ambulatory Visit | Attending: Family Medicine | Admitting: Family Medicine

## 2019-04-19 ENCOUNTER — Other Ambulatory Visit: Payer: Self-pay

## 2019-04-19 DIAGNOSIS — Z8673 Personal history of transient ischemic attack (TIA), and cerebral infarction without residual deficits: Secondary | ICD-10-CM | POA: Diagnosis not present

## 2019-04-19 DIAGNOSIS — G43909 Migraine, unspecified, not intractable, without status migrainosus: Secondary | ICD-10-CM | POA: Diagnosis not present

## 2019-05-05 ENCOUNTER — Ambulatory Visit (INDEPENDENT_AMBULATORY_CARE_PROVIDER_SITE_OTHER): Payer: PPO

## 2019-05-05 ENCOUNTER — Ambulatory Visit: Payer: Self-pay | Admitting: Family Medicine

## 2019-05-05 ENCOUNTER — Other Ambulatory Visit: Payer: Self-pay

## 2019-05-05 VITALS — BP 148/92 | HR 77 | Temp 98.2°F | Resp 16 | Ht 65.0 in | Wt 121.4 lb

## 2019-05-05 DIAGNOSIS — Z0000 Encounter for general adult medical examination without abnormal findings: Secondary | ICD-10-CM

## 2019-05-05 NOTE — Patient Instructions (Signed)
[name] , Thank you for taking time to come for your Medicare Wellness Visit. I appreciate your ongoing commitment to your health goals. Please review the following plan we discussed and let me know if I can assist you in the future.   Screening recommendations/referrals: Colonoscopy: done 12/11/2008 Mammogram: done 03/01/18. Please call [phone] to schedule your mammogram.  Bone Density: done 09/07/17 Recommended yearly ophthalmology/optometry visit for glaucoma screening and checkup Recommended yearly dental visit for hygiene and checkup  Vaccinations: Influenza vaccine: done 07/30/18 Pneumococcal vaccine: done 02/19/17 Tdap vaccine: done 11/14/16 Shingles vaccine: Shingrix discussed. Please contact your pharmacy for coverage information.   Advanced directives: Advance directive discussed with you today. I have provided a copy for you to complete at home and have notarized. Once this is complete please bring a copy in to our office so we can scan it into your chart.  Conditions/risks identified: recommend increasing physical activity  Next appointment: Please follow up in one year for your Medicare Annual Wellness visit.     Preventive Care 72 Years and Older, Female Preventive care refers to lifestyle choices and visits with your health care provider that can promote health and wellness. What does preventive care include?  A yearly physical exam. This is also called an annual well check.  Dental exams once or twice a year.  Routine eye exams. Ask your health care provider how often you should have your eyes checked.  Personal lifestyle choices, including:  Daily care of your teeth and gums.  Regular physical activity.  Eating a healthy diet.  Avoiding tobacco and drug use.  Limiting alcohol use.  Practicing safe sex.  Taking low-dose aspirin every day.  Taking vitamin and mineral supplements as recommended by your health care provider. What happens during an  annual well check? The services and screenings done by your health care provider during your annual well check will depend on your age, overall health, lifestyle risk factors, and family history of disease. Counseling  Your health care provider may ask you questions about your:  Alcohol use.  Tobacco use.  Drug use.  Emotional well-being.  Home and relationship well-being.  Sexual activity.  Eating habits.  History of falls.  Memory and ability to understand (cognition).  Work and work Statistician.  Reproductive health. Screening  You may have the following tests or measurements:  Height, weight, and BMI.  Blood pressure.  Lipid and cholesterol levels. These may be checked every 5 years, or more frequently if you are over 65 years old.  Skin check.  Lung cancer screening. You may have this screening every year starting at age 15 if you have a 30-pack-year history of smoking and currently smoke or have quit within the past 15 years.  Fecal occult blood test (FOBT) of the stool. You may have this test every year starting at age 38.  Flexible sigmoidoscopy or colonoscopy. You may have a sigmoidoscopy every 5 years or a colonoscopy every 10 years starting at age 97.  Hepatitis C blood test.  Hepatitis B blood test.  Sexually transmitted disease (STD) testing.  Diabetes screening. This is done by checking your blood sugar (glucose) after you have not eaten for a while (fasting). You may have this done every 1-3 years.  Bone density scan. This is done to screen for osteoporosis. You may have this done starting at age 21.  Mammogram. This may be done every 1-2 years. Talk to your health care provider about how often you should have regular  mammograms. Talk with your health care provider about your test results, treatment options, and if necessary, the need for more tests. Vaccines  Your health care provider may recommend certain vaccines, such as:  Influenza  vaccine. This is recommended every year.  Tetanus, diphtheria, and acellular pertussis (Tdap, Td) vaccine. You may need a Td booster every 10 years.  Zoster vaccine. You may need this after age 43.  Pneumococcal 13-valent conjugate (PCV13) vaccine. One dose is recommended after age 64.  Pneumococcal polysaccharide (PPSV23) vaccine. One dose is recommended after age 39. Talk to your health care provider about which screenings and vaccines you need and how often you need them. This information is not intended to replace advice given to you by your health care provider. Make sure you discuss any questions you have with your health care provider. Document Released: 11/23/2015 Document Revised: 07/16/2016 Document Reviewed: 08/28/2015 Elsevier Interactive Patient Education  2017 [place] Prevention in the Home Falls can cause injuries. They can happen to people of all ages. There are many things you can do to make your home safe and to help prevent falls. What can I do on the outside of my home?  Regularly fix the edges of walkways and driveways and fix any cracks.  Remove anything that might make you trip as you walk through a door, such as a raised step or threshold.  Trim any bushes or trees on the path to your home.  Use bright outdoor lighting.  Clear any walking paths of anything that might make someone trip, such as rocks or tools.  Regularly check to see if handrails are loose or broken. Make sure that both sides of any steps have handrails.  Any raised decks and porches should have guardrails on the edges.  Have any leaves, snow, or ice cleared regularly.  Use sand or salt on walking paths during winter.  Clean up any spills in your garage right away. This includes oil or grease spills. What can I do in the bathroom?  Use night lights.  Install grab bars by the toilet and in the tub and shower. Do not use towel bars as grab bars.  Use non-skid mats or decals  in the tub or shower.  If you need to sit down in the shower, use a plastic, non-slip stool.  Keep the floor dry. Clean up any water that spills on the floor as soon as it happens.  Remove soap buildup in the tub or shower regularly.  Attach bath mats securely with double-sided non-slip rug tape.  Do not have throw rugs and other things on the floor that can make you trip. What can I do in the bedroom?  Use night lights.  Make sure that you have a light by your bed that is easy to reach.  Do not use any sheets or blankets that are too big for your bed. They should not hang down onto the floor.  Have a firm chair that has side arms. You can use this for support while you get dressed.  Do not have throw rugs and other things on the floor that can make you trip. What can I do in the kitchen?  Clean up any spills right away.  Avoid walking on wet floors.  Keep items that you use a lot in easy-to-reach places.  If you need to reach something above you, use a strong step stool that has a grab bar.  Keep electrical cords out of the way.  Do not use floor polish or wax that makes floors slippery. If you must use wax, use non-skid floor wax.  Do not have throw rugs and other things on the floor that can make you trip. What can I do with my stairs?  Do not leave any items on the stairs.  Make sure that there are handrails on both sides of the stairs and use them. Fix handrails that are broken or loose. Make sure that handrails are as long as the stairways.  Check any carpeting to make sure that it is firmly attached to the stairs. Fix any carpet that is loose or worn.  Avoid having throw rugs at the top or bottom of the stairs. If you do have throw rugs, attach them to the floor with carpet tape.  Make sure that you have a light switch at the top of the stairs and the bottom of the stairs. If you do not have them, ask someone to add them for you. What else can I do to help  prevent falls?  Wear shoes that:  Do not have high heels.  Have rubber bottoms.  Are comfortable and fit you well.  Are closed at the toe. Do not wear sandals.  If you use a stepladder:  Make sure that it is fully opened. Do not climb a closed stepladder.  Make sure that both sides of the stepladder are locked into place.  Ask someone to hold it for you, if possible.  Clearly mark and make sure that you can see:  Any grab bars or handrails.  First and last steps.  Where the edge of each step is.  Use tools that help you move around (mobility aids) if they are needed. These include:  Canes.  Walkers.  Scooters.  Crutches.  Turn on the lights when you go into a dark area. Replace any light bulbs as soon as they burn out.  Set up your furniture so you have a clear path. Avoid moving your furniture around.  If any of your floors are uneven, fix them.  If there are any pets around you, be aware of where they are.  Review your medicines with your doctor. Some medicines can make you feel dizzy. This can increase your chance of falling. Ask your doctor what other things that you can do to help prevent falls. This information is not intended to replace advice given to you by your health care provider. Make sure you discuss any questions you have with your health care provider. Document Released: 08/23/2009 Document Revised: 04/03/2016 Document Reviewed: 12/01/2014 Elsevier Interactive Patient Education  2017 [employer].

## 2019-05-05 NOTE — Addendum Note (Signed)
Addended by: Docia Furl on: 05/05/2019 01:49 PM   Modules accepted: Orders

## 2019-05-05 NOTE — Progress Notes (Signed)
Subjective:   Laura Deleon is a 73 y.o. female who presents for Medicare Annual (Subsequent) preventive examination.  Review of Systems:   Cardiac Risk Factors include: advanced age (>50men, >76 women);hypertension;dyslipidemia     Objective:     Vitals: BP (!) 148/92 (BP Location: Left Arm, Patient Position: Sitting, Cuff Size: Normal)   Pulse 77   Temp 98.2 F (36.8 C) (Oral)   Resp 16   Ht 5\' 5"  (1.651 m)   Wt 121 lb 6.4 oz (55.1 kg)   SpO2 98%   BMI 20.20 kg/m   Body mass index is 20.2 kg/m.  Advanced Directives 05/05/2019 04/29/2018 08/27/2017 02/19/2017 12/04/2016 11/14/2016 08/19/2016  Does Patient Have a Medical Advance Directive? No No No No No No Yes  Does patient want to make changes to medical advance directive? - - - - - - No - Patient declined  Copy of [place] in Chart? - - - - - - No - copy requested  Would patient like information on creating a medical advance directive? Yes (MAU/Ambulatory/Procedural Areas - Information given) Yes (MAU/Ambulatory/Procedural Areas - Information given) - - - - -    Tobacco Social History   Tobacco Use  Smoking Status Never Smoker  Smokeless Tobacco Never Used  Tobacco Comment   smoking cessation materials not required     Counseling given: Not Answered Comment: smoking cessation materials not required   Clinical Intake:  Pre-visit preparation completed: Yes  Pain : No/denies pain     BMI - recorded: 20.2 Nutritional Status: BMI of 19-24  Normal Nutritional Risks: None Diabetes: No  How often do you need to have someone help you when you read instructions, pamphlets, or other written materials from your doctor or pharmacy?: 1 - Never  Interpreter Needed?: No  Information entered by :: Clemetine Marker LPN  Past Medical History:  Diagnosis Date  . Allergy   . Hernia of anterior abdominal wall   . Hyperlipidemia   . Hypertension   . Osteopenia 09/08/2017   Oct 2018; next scan => Sep 10, 2019   Past Surgical History:  Procedure Laterality Date  . ABDOMINAL HYSTERECTOMY    . BLADDER SURGERY    . HAND SURGERY  11/14/2016   removal of foreign body  . HERNIA REPAIR     with mesh   Family History  Problem Relation Age of Onset  . Hyperlipidemia Mother   . Hypertension Mother   . Hyperlipidemia Father   . Emphysema Father   . COPD Father   . Breast cancer Neg Hx    Social History   Socioeconomic History  . Marital status: Widowed    Spouse name: Durene Fruits  . Number of children: 2  . Years of education: Not on file  . Highest education level: 12th grade  Occupational History  . Occupation: Retired  Scientific laboratory technician  . Financial resource strain: Somewhat hard  . Food insecurity    Worry: Never true    Inability: Never true  . Transportation needs    Medical: No    Non-medical: No  Tobacco Use  . Smoking status: Never Smoker  . Smokeless tobacco: Never Used  . Tobacco comment: smoking cessation materials not required  Substance and Sexual Activity  . Alcohol use: No    Alcohol/week: 0.0 standard drinks  . Drug use: No  . Sexual activity: Not Currently  Lifestyle  . Physical activity    Days per week: 0 days  Minutes per session: 0 min  . Stress: To some extent  Relationships  . Social connections    Talks on phone: More than three times a week    Gets together: Three times a week    Attends religious service: More than 4 times per year    Active member of club or organization: No    Attends meetings of clubs or organizations: Never    Relationship status: Widowed  Other Topics Concern  . Not on file  Social History Narrative   Husband passed away 2017, she is raising her 72 year old great grandson    Outpatient Encounter Medications as of 05/05/2019  Medication Sig  . aspirin 81 MG EC tablet Take 81 mg by mouth daily. Swallow whole.  . ezetimibe (ZETIA) 10 MG tablet TAKE 1 TABLET BY MOUTH DAILY  . fluticasone (FLONASE) 50 MCG/ACT nasal spray Place  2 sprays into both nostrils daily. (Patient taking differently: Place 2 sprays into both nostrils as needed. )  . metoprolol succinate (TOPROL-XL) 50 MG 24 hr tablet TAKE 1 TABLET BY MOUTH DAILY WITH OR IMMEDIATELY FOLLOWING A MEAL  . Multiple Vitamin (MULTI-VITAMINS) TABS Take 1 tablet by mouth daily.  . [DISCONTINUED] aspirin EC 325 MG tablet Take 1 tablet (325 mg total) by mouth daily.  Marland Kitchen. atorvastatin (LIPITOR) 10 MG tablet Take 1 tablet (10 mg total) by mouth at bedtime. For cholesterol; do NOT take rosuvastatin (Patient not taking: Reported on 05/05/2019)  . loratadine (CLARITIN) 10 MG tablet Take 1 tablet (10 mg total) by mouth daily as needed for allergies. (Patient not taking: Reported on 05/05/2019)  . [DISCONTINUED] LORazepam (ATIVAN) 0.5 MG tablet Take one pill one hour prior to MRI; may repeat one more pill 30 minutes after the 1st pill if needed; do not drive for 6 hours   No facility-administered encounter medications on file as of 05/05/2019.     Activities of Daily Living In your present state of health, do you have any difficulty performing the following activities: 05/05/2019 02/17/2019  Hearing? N N  Comment declines hearing aids -  Vision? N Y  Comment wears glasses -  Difficulty concentrating or making decisions? N N  Walking or climbing stairs? N N  Dressing or bathing? N N  Doing errands, shopping? N N  Preparing Food and eating ? N -  Using the Toilet? N -  In the past six months, have you accidently leaked urine? Y -  Comment wears pads for protection -  Do you have problems with loss of bowel control? N -  Managing your Medications? N -  Managing your Finances? N -  Housekeeping or managing your Housekeeping? N -  Some recent data might be hidden    Patient Care Team: Lada, Janit BernMelinda P, MD as PCP - General (Family Medicine)    Assessment:   This is a routine wellness examination for Laura Deleon.  Exercise Activities and Dietary recommendations Current Exercise  Habits: The patient does not participate in regular exercise at present, Exercise limited by: None identified  Goals    . DIET - INCREASE WATER INTAKE     Recommend to drink at least 6-8 8oz glasses of water per day.       Fall Risk Fall Risk  05/05/2019 02/17/2019 08/17/2018 04/29/2018 02/26/2018  Falls in the past year? 1 0 No No No  Number falls in past yr: 0 - - - -  Injury with Fall? 0 - - - -  Risk for  fall due to : - - - Impaired vision -  Risk for fall due to: Comment - - - wears eyeglasses -  Follow up Falls prevention discussed - - - -   FALL RISK PREVENTION PERTAINING TO THE HOME:  Any stairs in or around the home? Yes  If so, do they handrails? Yes   Home free of loose throw rugs in walkways, pet beds, electrical cords, etc? Yes  Adequate lighting in your home to reduce risk of falls? Yes   ASSISTIVE DEVICES UTILIZED TO PREVENT FALLS:  Life alert? No  Use of a cane, walker or w/c? No  Grab bars in the bathroom? No  Shower chair or bench in shower? No  Elevated toilet seat or a handicapped toilet? Yes   DME ORDERS:  DME order needed?  No   TIMED UP AND GO:  Was the test performed? Yes .  Length of time to ambulate 10 feet: 5 sec.   GAIT:  Appearance of gait: Gait stead-fast and without the use of an assistive device.    Education: Fall risk prevention has been discussed.  Intervention(s) required? No    Depression Screen PHQ 2/9 Scores 05/05/2019 02/17/2019 02/03/2019 08/17/2018  PHQ - 2 Score 2 0 3 0  PHQ- 9 Score 3 0 6 0     Cognitive Function     6CIT Screen 05/05/2019 04/29/2018  What Year? 0 points 0 points  What month? 0 points 0 points  What time? 0 points 0 points  Count back from 20 0 points 0 points  Months in reverse 0 points 0 points  Repeat phrase 2 points 2 points  Total Score 2 2    Immunization History  Administered Date(s) Administered  . Influenza, High Dose Seasonal PF 08/19/2016, 08/27/2017, 07/30/2018  . Influenza,inj,Quad  PF,6+ Mos 07/19/2015  . Influenza-Unspecified 07/11/2014  . Pneumococcal Conjugate-13 08/01/2014  . Pneumococcal Polysaccharide-23 02/19/2017  . Tdap 11/23/2007, 11/14/2016    Qualifies for Shingles Vaccine? Yes  . Due for Shingrix. Education has been provided regarding the importance of this vaccine. Pt has been advised to call insurance company to determine out of pocket expense. Advised may also receive vaccine at local pharmacy or Health Dept. Verbalized acceptance and understanding.  Tdap: Up to date  Flu Vaccine: Up to date  Pneumococcal Vaccine: Up to date   Screening Tests Health Maintenance  Topic Date Due  . COLONOSCOPY  12/11/2018  . MAMMOGRAM  03/02/2019  . INFLUENZA VACCINE  06/11/2019  . DEXA SCAN  09/08/2019  . TETANUS/TDAP  11/14/2026  . Hepatitis C Screening  Completed  . PNA vac Low Risk Adult  Completed    Cancer Screenings:  Colorectal Screening: Completed 12/11/2008. Repeat every 10 years; Pt requests to postpone until next year.   Mammogram: Completed 03/01/18. Repeat every year. Ordered today. Pt provided with contact information and advised to call to schedule appt.   Bone Density: Completed 09/07/17. Results reflect OSTEOPENIA. Repeat every 2 years. Pt declines repeat screening at this time.    Lung Cancer Screening: (Low Dose CT Chest recommended if Age 16-80 years, 30 pack-year currently smoking OR have quit w/in 15years.) does not qualify.    Additional Screening:  Hepatitis C Screening: does qualify; Completed 10/12/17  Vision Screening: Recommended annual ophthalmology exams for early detection of glaucoma and other disorders of the eye. Is the patient up to date with their annual eye exam?  No  Who is the provider or what is the name of  the office in which the pt attends annual eye exams? [facility]   Dental Screening: Recommended annual dental exams for proper oral hygiene  Community Resource Referral:  CRR required this visit?   No      Plan:     I have personally reviewed and addressed the Medicare Annual Wellness questionnaire and have noted the following in the patient's chart:  A. Medical and social history B. Use of alcohol, tobacco or illicit drugs  C. Current medications and supplements D. Functional ability and status E.  Nutritional status F.  Physical activity G. Advance directives H. List of other physicians I.  Hospitalizations, surgeries, and ER visits in previous 12 months J.  Vitals K. Screenings such as hearing and vision if needed, cognitive and depression L. Referrals and appointments   In addition, I have reviewed and discussed with patient certain preventive protocols, quality metrics, and best practice recommendations. A written personalized care plan for preventive services as well as general preventive health recommendations were provided to patient.   Signed,  Reather LittlerKasey Adeli Frost, LPN  Nurse Health Advisor   Nurse Notes: pt's BP slightly elevated at visit today. 148/92 at beginning of visit and same at recheck at end of visit. Advised pt to schedule follow up office visit with Sharyon CableElizabeth Poulose NP for BP check and labs.

## 2019-05-05 NOTE — Telephone Encounter (Signed)
Pt seen in office today for Medicare AWV. She is requesting refills for claritin and flonase. Thank you!

## 2019-05-06 MED ORDER — LORATADINE 10 MG PO TABS: 10.0000 mg | ORAL_TABLET | Freq: Every day | ORAL | 3 refills | Status: DC | PRN

## 2019-05-06 MED ORDER — FLUTICASONE PROPIONATE 50 MCG/ACT NA SUSP: 2.0000 | NASAL | 11 refills | Status: DC | PRN

## 2019-05-10 ENCOUNTER — Ambulatory Visit: Payer: Self-pay | Admitting: Family Medicine

## 2019-05-10 NOTE — Telephone Encounter (Signed)
Pt. Reports her neighbor tested positive for COVID 44 yesterday. She was in contact with this person Sunday. Would like to talk about testing during tomorrow's appointment.

## 2019-05-11 ENCOUNTER — Ambulatory Visit (INDEPENDENT_AMBULATORY_CARE_PROVIDER_SITE_OTHER): Payer: PPO | Admitting: Nurse Practitioner

## 2019-05-11 ENCOUNTER — Encounter: Payer: Self-pay | Admitting: Nurse Practitioner

## 2019-05-11 VITALS — BP 125/75 | HR 85 | Temp 98.6°F | Resp 16

## 2019-05-11 DIAGNOSIS — M8580 Other specified disorders of bone density and structure, unspecified site: Secondary | ICD-10-CM

## 2019-05-11 DIAGNOSIS — Z8673 Personal history of transient ischemic attack (TIA), and cerebral infarction without residual deficits: Secondary | ICD-10-CM

## 2019-05-11 DIAGNOSIS — Z20828 Contact with and (suspected) exposure to other viral communicable diseases: Secondary | ICD-10-CM

## 2019-05-11 DIAGNOSIS — Z1239 Encounter for other screening for malignant neoplasm of breast: Secondary | ICD-10-CM

## 2019-05-11 DIAGNOSIS — I10 Essential (primary) hypertension: Secondary | ICD-10-CM | POA: Diagnosis not present

## 2019-05-11 DIAGNOSIS — I700 Atherosclerosis of aorta: Secondary | ICD-10-CM | POA: Diagnosis not present

## 2019-05-11 DIAGNOSIS — Z20822 Contact with and (suspected) exposure to covid-19: Secondary | ICD-10-CM

## 2019-05-11 DIAGNOSIS — E785 Hyperlipidemia, unspecified: Secondary | ICD-10-CM | POA: Diagnosis not present

## 2019-05-11 NOTE — Patient Instructions (Signed)
Please do call to schedule your mammogram; the number to schedule one at either [facility] or [facility] is [phone]   

## 2019-05-11 NOTE — Progress Notes (Signed)
Virtual Visit via Video Note  I connected with Laura BridegroomLinda Deleon on 05/11/19 at 11:20 AM EDT by a video enabled telemedicine application and verified that I am speaking with the correct person using two identifiers.   Staff discussed the limitations of evaluation and management by telemedicine and the availability of in person appointments. The patient expressed understanding and agreed to proceed.  Patient location: home  My location: work office Other people present:  None  HPI Neighbor was positive for COVID19 on Monday, and she saw him on Sunday- was not wearing masks but was socially distanced. Denies fevers, chills, shortness of breath, cough, loss of sense of smell or taste, diarrhea, headaches. She is allergic to ragweed and states has had watery itchy eyes and nasal congestion.   Hypertension Patient is on metoprolol 50mg  daily.  Takes medications as prescribed with no missed doses a month.  She doesn't add salt but eats some processed foods, compliant with low-salt diet.  Denies chest pain, headaches, lightheadedness, dizziness. Endorses progressive blurry vision- needs eye check up BP Readings from Last 3 Encounters:  05/05/19 (!) 148/92  02/03/19 (!) 153/78  08/17/18 126/74    Hyperlipidemia Patient rx zetia 8872m and atorvastatin 10mg  daily states taking zetia as prescribed but had myalgias with rosuvastatin and was concerned with starting atorvastatin so she has not.  She has a calcification of her abdominal aorta and history of TIA. She is additionally taking a daily 81mg  aspirin.  Denies myalgias Lab Results  Component Value Date   CHOL 198 08/17/2018   HDL 56 08/17/2018   LDLCALC 120 (H) 08/17/2018   TRIG 112 08/17/2018   CHOLHDL 3.5 08/17/2018   The 10-year ASCVD risk score Denman George(Goff DC Jr., et al., 2013) is: 13.3%   Values used to calculate the score:     Age: [age] years     Sex: Female     Is Non-Hispanic African American: No     Diabetic: No     Tobacco smoker: No   Systolic Blood Pressure: 125 mmHg     Is BP treated: Yes     HDL Cholesterol: 56 mg/dL     Total Cholesterol: 198 mg/dL  Osteopenia Patient is taking multivitamin with calcium and vitamin D, she is active and cares for her grand kids   PHQ2/9: [facility] screen [facility] 05/11/2019 05/05/2019 02/17/2019 02/03/2019 08/17/2018  Decreased Interest 0 1 0 0 0  Down, Depressed, Hopeless 0 1 0 3 0  PHQ - 2 Score 0 2 0 3 0  Altered sleeping 0 1 0 3 0  Tired, decreased energy 0 0 0 0 0  Change in appetite 0 0 0 0 0  Feeling bad or failure about yourself  0 0 0 0 0  Trouble concentrating 0 0 0 0 0  Moving slowly or fidgety/restless 0 0 0 0 0  Suicidal thoughts 0 0 0 0 0  PHQ-9 Score 0 3 0 6 0  Difficult doing work/chores Not difficult at all Not difficult at all Not difficult at all Not difficult at all Not difficult at all    PHQ reviewed. Negative  Patient Active Problem List   Diagnosis Date Noted  . Hx of transient ischemic attack (TIA) 02/17/2019  . Onychomycosis 08/17/2018  . Peripheral neuropathy 08/17/2018  . Foot callus 08/17/2018  . Contracture of joint of finger of right hand 11/11/2017  . Osteopenia 09/08/2017  . Postmenopausal 08/27/2017  . Gallstone 12/16/2016  . Calcification of abdominal aorta (HCC) 12/16/2016  .  Laceration of deep palmar arch of left hand 11/14/2016  . Encounter for medication monitoring 08/19/2016  . Abdominal wall hernia 07/19/2015  . Allergic rhinitis, seasonal 07/19/2015  . Blood glucose elevated 07/19/2015  . Hypertension goal BP (blood pressure) < 140/90 07/19/2015  . 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  . Left-sided thoracic back pain 07/19/2015  . Hyperlipidemia LDL goal <70 07/19/2015    Past Medical History:  Diagnosis Date  . Allergy   . Hernia of anterior abdominal wall   . Hyperlipidemia   . Hypertension   . Osteopenia 09/08/2017   Oct 2018; next scan => Sep 10, 2019    Past Surgical History:  Procedure Laterality Date  . ABDOMINAL  HYSTERECTOMY    . BLADDER SURGERY    . HAND SURGERY  11/14/2016   removal of foreign body  . HERNIA REPAIR     with mesh    Social History   Tobacco Use  . Smoking status: Never Smoker  . Smokeless tobacco: Never Used  . Tobacco comment: smoking cessation materials not required  Substance Use Topics  . Alcohol use: No    Alcohol/week: 0.0 standard drinks     Current Outpatient Medications:  .  aspirin 81 MG EC tablet, Take 81 mg by mouth daily. Swallow whole., Disp: , Rfl:  .  ezetimibe (ZETIA) 10 MG tablet, TAKE 1 TABLET BY MOUTH DAILY, Disp: 90 tablet, Rfl: 3 .  fluticasone (FLONASE) 50 MCG/ACT nasal spray, Place 2 sprays into both nostrils as needed., Disp: 1 g, Rfl: 11 .  loratadine (CLARITIN) 10 MG tablet, Take 1 tablet (10 mg total) by mouth daily as needed for allergies., Disp: 90 tablet, Rfl: 3 .  metoprolol succinate (TOPROL-XL) 50 MG 24 hr tablet, TAKE 1 TABLET BY MOUTH DAILY WITH OR IMMEDIATELY FOLLOWING A MEAL, Disp: 90 tablet, Rfl: 3 .  Multiple Vitamin (MULTI-VITAMINS) TABS, Take 1 tablet by mouth daily., Disp: , Rfl:  .  atorvastatin (LIPITOR) 10 MG tablet, Take 1 tablet (10 mg total) by mouth at bedtime. For cholesterol; do NOT take rosuvastatin (Patient not taking: Reported on 05/05/2019), Disp: 90 tablet, Rfl: 1  Allergies  Allergen Reactions  . Sulfa Antibiotics Shortness Of Breath  . Latex     ROS   No other specific complaints in a complete review of systems (except as listed in HPI above).  Objective  There were no vitals filed for this visit.   There is no height or weight on file to calculate BMI.  Nursing Note and Vital Signs reviewed.  Physical Exam  Constitutional: Patient appears well-developed and well-nourished. No distress.  HENT: Head: Normocephalic and atraumatic. Cardiovascular: Normal rate Pulmonary/Chest: Effort normal  Musculoskeletal: Normal range of motion,  Neurological: alert and oriented, speech normal.  Skin: No rash  noted. No erythema.  Psychiatric: Patient has a normal mood and affect. behavior is normal. Judgment and thought content normal.    Assessment & Plan  1. Hypertension goal BP (blood pressure) < 140/90 bp well controlled at home, continue meds  2. Calcification of abdominal aorta (HCC) Lipid and BP control   3. Hyperlipidemia LDL goal <70 Will try atorvastatin 2 times a week let us know if she is having issues   4. Hx of transient ischemic attack (TIA) ASA, lipid and BP control   5. Osteopenia, unspecified location Supplementation   6. Exposure to Covid-19 Virus Will call back if she become symptomatic   7. Screening for breast cancer - MM Digital Screening; Future  Follow Up Instructions:  6 months   I discussed the assessment and treatment plan with the patient. The patient was provided an opportunity to ask questions and all were answered. The patient agreed with the plan and demonstrated an understanding of the instructions.   The patient was advised to call back or seek an in-person evaluation if the symptoms worsen or if the condition fails to improve as anticipated.  I provided 26 minutes of non-face-to-face time during this encounter.   Fredderick Severance, NP

## 2019-05-15 DIAGNOSIS — R197 Diarrhea, unspecified: Secondary | ICD-10-CM | POA: Diagnosis not present

## 2019-07-08 ENCOUNTER — Encounter: Payer: Self-pay | Admitting: Family Medicine

## 2019-07-08 ENCOUNTER — Ambulatory Visit
Admission: RE | Admit: 2019-07-08 | Discharge: 2019-07-08 | Disposition: A | Discharge status: Home / Self Care | Payer: PPO | Source: Ambulatory Visit | Attending: Nurse Practitioner | Admitting: Nurse Practitioner

## 2019-07-08 DIAGNOSIS — Z1231 Encounter for screening mammogram for malignant neoplasm of breast: Secondary | ICD-10-CM | POA: Diagnosis not present

## 2019-07-08 DIAGNOSIS — Z1239 Encounter for other screening for malignant neoplasm of breast: Secondary | ICD-10-CM

## 2019-08-04 ENCOUNTER — Ambulatory Visit (INDEPENDENT_AMBULATORY_CARE_PROVIDER_SITE_OTHER): Payer: PPO

## 2019-08-04 ENCOUNTER — Other Ambulatory Visit: Payer: Self-pay

## 2019-08-04 DIAGNOSIS — Z23 Encounter for immunization: Secondary | ICD-10-CM | POA: Diagnosis not present

## 2019-08-15 ENCOUNTER — Other Ambulatory Visit: Payer: Self-pay

## 2019-08-15 NOTE — Telephone Encounter (Signed)
Hypertension medication request: Metoprolol to [facility]   Last office visit pertaining to hypertension: 05/11/19   BP Readings from Last 3 Encounters:  05/11/19 125/75  05/05/19 (!) 148/92  02/03/19 (!) 153/78    Lab Results  Component Value Date   CREATININE 0.62 08/17/2018   BUN 13 08/17/2018   NA 133 (L) 08/17/2018   K 4.1 08/17/2018   CL 97 (L) 08/17/2018   CO2 29 08/17/2018     Follow up on 11/14/2019

## 2019-08-16 MED ORDER — METOPROLOL SUCCINATE ER 50 MG PO TB24: ORAL_TABLET | ORAL | 3 refills | Status: DC

## 2019-11-14 ENCOUNTER — Other Ambulatory Visit: Payer: Self-pay | Admitting: Emergency Medicine

## 2019-11-14 ENCOUNTER — Ambulatory Visit: Payer: PPO | Admitting: Family Medicine

## 2019-11-15 MED ORDER — EZETIMIBE 10 MG PO TABS: 10.0000 mg | ORAL_TABLET | Freq: Every day | ORAL | 3 refills | Status: DC

## 2019-11-22 ENCOUNTER — Other Ambulatory Visit: Payer: Self-pay

## 2019-11-22 ENCOUNTER — Ambulatory Visit (INDEPENDENT_AMBULATORY_CARE_PROVIDER_SITE_OTHER): Payer: PPO | Admitting: Family Medicine

## 2019-11-22 ENCOUNTER — Encounter: Payer: Self-pay | Admitting: Family Medicine

## 2019-11-22 VITALS — BP 142/82 | HR 98 | Temp 97.5°F | Resp 14 | Ht 65.0 in | Wt 126.5 lb

## 2019-11-22 DIAGNOSIS — R739 Hyperglycemia, unspecified: Secondary | ICD-10-CM | POA: Diagnosis not present

## 2019-11-22 DIAGNOSIS — J302 Other seasonal allergic rhinitis: Secondary | ICD-10-CM

## 2019-11-22 DIAGNOSIS — E785 Hyperlipidemia, unspecified: Secondary | ICD-10-CM

## 2019-11-22 DIAGNOSIS — I700 Atherosclerosis of aorta: Secondary | ICD-10-CM | POA: Diagnosis not present

## 2019-11-22 DIAGNOSIS — Z5181 Encounter for therapeutic drug level monitoring: Secondary | ICD-10-CM

## 2019-11-22 DIAGNOSIS — I10 Essential (primary) hypertension: Secondary | ICD-10-CM

## 2019-11-22 DIAGNOSIS — M8580 Other specified disorders of bone density and structure, unspecified site: Secondary | ICD-10-CM | POA: Diagnosis not present

## 2019-11-22 DIAGNOSIS — G6289 Other specified polyneuropathies: Secondary | ICD-10-CM | POA: Diagnosis not present

## 2019-11-22 NOTE — Progress Notes (Signed)
Name: Laura Deleon   MRN: [id number]    DOB: [birth year]   Date:11/22/2019       Progress Note  Chief Complaint  Patient presents with  . Follow-up  . Hyperlipidemia  . Hypertension     Subjective:   Laura Deleon is a 74 y.o. female, presents to clinic for routine follow up on the conditions listed above.  Hyperlipidemia: Current Medication Regimen:  lipitor 10 mg - not tolerating so will decrease dose, hx of intolerance Last Lipids: Lab Results  Component Value Date   CHOL 198 08/17/2018   HDL 56 08/17/2018   LDLCALC 120 (H) 08/17/2018   TRIG 112 08/17/2018   CHOLHDL 3.5 08/17/2018  also taking zetia - Current Diet: overall healthy - Denies: Chest pain, shortness of breath, myalgias. - Documented aortic atherosclerosis? Yes - Risk factors for atherosclerosis: hypercholesterolemia and hypertension   Hypertension:  Currently managed on metoprolol Pt reports good med compliance and denies any SE.  No lightheadedness, hypotension, syncope. Blood pressure today is at goal for age of under 150/90 BP Readings from Last 3 Encounters:  11/22/19 (!) 142/82  05/11/19 125/75  05/05/19 (!) 148/92   Pt denies CP, SOB, exertional sx, LE edema, palpitation, Ha's, visual disturbances Dietary efforts for BP?     She takes ASA for TIA hx, denies ever being put on Plavix  Allergic Rhinitis - PRN typically uses in fall and ragweed, not taking right now, when she needs she is taking Claritin and Flonase   Patient Active Problem List   Diagnosis Date Noted  . Hx of transient ischemic attack (TIA) 02/17/2019  . Peripheral neuropathy 08/17/2018  . Contracture of joint of finger of right hand 11/11/2017  . Osteopenia 09/08/2017  . Gallstone 12/16/2016  . Calcification of abdominal aorta (HCC) 12/16/2016  . Abdominal wall hernia 07/19/2015  . Allergic rhinitis, seasonal 07/19/2015  . Blood glucose elevated 07/19/2015  . Hypertension goal BP (blood pressure) < 140/90 07/19/2015    . 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  . Hyperlipidemia LDL goal <70 07/19/2015    Past Surgical History:  Procedure Laterality Date  . ABDOMINAL HYSTERECTOMY    . BLADDER SURGERY    . HAND SURGERY  11/14/2016   removal of foreign body  . HERNIA REPAIR     with mesh    Family History  Problem Relation Age of Onset  . Hyperlipidemia Mother   . Hypertension Mother   . Hyperlipidemia Father   . Emphysema Father   . COPD Father   . Breast cancer Neg Hx     Social History   Socioeconomic History  . Marital status: Widowed    Spouse name: Durene Fruits  . Number of children: 2  . Years of education: Not on file  . Highest education level: 12th grade  Occupational History  . Occupation: Retired  Tobacco Use  . Smoking status: Never Smoker  . Smokeless tobacco: Never Used  . Tobacco comment: smoking cessation materials not required  Substance and Sexual Activity  . Alcohol use: No    Alcohol/week: 0.0 standard drinks  . Drug use: No  . Sexual activity: Not Currently  Other Topics Concern  . Not on file  Social History Narrative   Husband passed away March 03, 2016, she is raising her 72 year old great grandson   Social Determinants of Radio broadcast assistant Strain: Medium Risk  . Difficulty of Paying Living Expenses: Somewhat hard  Food Insecurity:   . Worried About Running  Out of Food in the Last Year: Not on file  . Ran Out of Food in the Last Year: Not on file  Transportation Needs:   . Lack of Transportation (Medical): Not on file  . Lack of Transportation (Non-Medical): Not on file  Physical Activity:   . Days of Exercise per Week: Not on file  . Minutes of Exercise per Session: Not on file  Stress: Stress Concern Present  . Feeling of Stress : To some extent  Social Connections: Unknown  . Frequency of Communication with Friends and Family: More than three times a week  . Frequency of Social Gatherings with Friends and Family: Three times a week  . Attends Religious  Services: More than 4 times per year  . Active Member of Clubs or Organizations: No  . Attends Banker Meetings: Never  . Marital Status: Not on file  Intimate Partner Violence:   . Fear of Current or Ex-Partner: Not on file  . Emotionally Abused: Not on file  . Physically Abused: Not on file  . Sexually Abused: Not on file     Current Outpatient Medications:  .  aspirin 81 MG EC tablet, Take 81 mg by mouth daily. Swallow whole., Disp: , Rfl:  .  ezetimibe (ZETIA) 10 MG tablet, Take 1 tablet (10 mg total) by mouth daily., Disp: 90 tablet, Rfl: 3 .  fluticasone (FLONASE) 50 MCG/ACT nasal spray, Place 2 sprays into both nostrils as needed., Disp: 1 g, Rfl: 11 .  loratadine (CLARITIN) 10 MG tablet, Take 1 tablet (10 mg total) by mouth daily as needed for allergies., Disp: 90 tablet, Rfl: 3 .  metoprolol succinate (TOPROL-XL) 50 MG 24 hr tablet, TAKE 1 TABLET BY MOUTH DAILY WITH OR IMMEDIATELY FOLLOWING A MEAL, Disp: 90 tablet, Rfl: 3 .  Multiple Vitamin (MULTI-VITAMINS) TABS, Take 1 tablet by mouth daily., Disp: , Rfl:   Allergies  Allergen Reactions  . Sulfa Antibiotics Shortness Of Breath  . Latex     Chart Review Today: I personally reviewed active problem list, medication list, allergies, family history, social history, health maintenance, notes from last encounter, lab results, imaging with the patient/caregiver today.   Review of Systems  Constitutional: Negative.   HENT: Negative.   Eyes: Negative.   Respiratory: Negative.   Cardiovascular: Negative.   Gastrointestinal: Negative.   Endocrine: Negative.   Genitourinary: Negative.   Musculoskeletal: Negative.   Skin: Negative.   Allergic/Immunologic: Negative.   Neurological: Negative.   Hematological: Negative.   Psychiatric/Behavioral: Negative.   All other systems reviewed and are negative.    Objective:    Vitals:   11/22/19 1456  BP: (!) 142/82  Pulse: 98  Resp: 14  Temp: (!) 97.5 F (36.4  C)  TempSrc: Temporal  SpO2: 98%  Weight: 126 lb 8 oz (57.4 kg)  Height: 5\' 5"  (1.651 m)    Body mass index is 21.05 kg/m.  Physical Exam Vitals and nursing note reviewed.  Constitutional:      General: She is not in acute distress.    Appearance: Normal appearance. She is well-developed. She is not ill-appearing, toxic-appearing or diaphoretic.     Interventions: Face mask in place.  HENT:     Head: Normocephalic and atraumatic.     Right Ear: External ear normal.     Left Ear: External ear normal.  Eyes:     General: Lids are normal. No scleral icterus.       Right eye: No discharge.  Left eye: No discharge.     Conjunctiva/sclera: Conjunctivae normal.  Neck:     Trachea: Phonation normal. No tracheal deviation.  Cardiovascular:     Rate and Rhythm: Normal rate and regular rhythm.     Pulses: Normal pulses.          Radial pulses are 2+ on the right side and 2+ on the left side.       Posterior tibial pulses are 2+ on the right side and 2+ on the left side.     Heart sounds: Normal heart sounds. No murmur. No friction rub. No gallop.   Pulmonary:     Effort: Pulmonary effort is normal. No respiratory distress.     Breath sounds: Normal breath sounds. No stridor. No wheezing, rhonchi or rales.  Chest:     Chest wall: No tenderness.  Abdominal:     General: Bowel sounds are normal. There is no distension.     Palpations: Abdomen is soft.     Tenderness: There is no abdominal tenderness. There is no guarding or rebound.  Musculoskeletal:        General: No deformity. Normal range of motion.     Cervical back: Normal range of motion and neck supple.     Right lower leg: No edema.     Left lower leg: No edema.  Lymphadenopathy:     Cervical: No cervical adenopathy.  Skin:    General: Skin is warm and dry.     Capillary Refill: Capillary refill takes less than 2 seconds.     Coloration: Skin is not jaundiced or pale.     Findings: No rash.  Neurological:      Mental Status: She is alert and oriented to person, place, and time.     Motor: No abnormal muscle tone.     Gait: Gait normal.  Psychiatric:        Speech: Speech normal.        Behavior: Behavior normal.       Diabetic Foot  PHQ2/9: Depression screen [facility] 2/9 11/22/2019 05/11/2019 05/05/2019 02/17/2019 02/03/2019  Decreased Interest 0 0 1 0 0  Down, Depressed, Hopeless 0 0 1 0 3  PHQ - 2 Score 0 0 2 0 3  Altered sleeping 0 0 1 0 3  Tired, decreased energy 1 0 0 0 0  Change in appetite 1 0 0 0 0  Feeling bad or failure about yourself  0 0 0 0 0  Trouble concentrating 0 0 0 0 0  Moving slowly or fidgety/restless 0 0 0 0 0  Suicidal thoughts 0 0 0 0 0  PHQ-9 Score 2 0 3 0 6  Difficult doing work/chores Somewhat difficult Not difficult at all Not difficult at all Not difficult at all Not difficult at all  Some recent data might be hidden    phq 9 is neg, less than 4, reviewed today  Fall Risk: Fall Risk  11/22/2019 05/11/2019 05/05/2019 02/17/2019 08/17/2018  Falls in the past year? 1 1 1  0 No  Number falls in past yr: 0 0 0 - -  Injury with Fall? 0 0 0 - -  Risk for fall due to : - - - - -  Risk for fall due to: Comment - - - - -  Follow up - - Falls prevention discussed - -    Functional Status Survey: Is the patient deaf or have difficulty hearing?: Yes(left ear) Does the patient have difficulty seeing, even when  wearing glasses/contacts?: Yes Does the patient have difficulty concentrating, remembering, or making decisions?: Yes(remembering) Does the patient have difficulty walking or climbing stairs?: No Does the patient have difficulty dressing or bathing?: No Does the patient have difficulty doing errands alone such as visiting a doctor's office or shopping?: No   Assessment & Plan:     ICD-10-CM   1. Essential hypertension  I10 CMP w GFR   stable for age, discussed monitoring so not >150/90  2. Hyperlipidemia LDL goal <70  E78.5 CMP w GFR    Lipid Panel   some intolerance  of statins, discussed taking a few times a week with zetia and healthy diet for prevention of MI/stroke, on ASA with hx of tia, no past CVA  3. Seasonal allergic rhinitis, unspecified trigger  J30.2    well controlled, stable  4. Other polyneuropathy  G62.89 CBC w/ Diff   states in bilateral feet due to high arches, she tolerates pain and doesn't want any meds right now, she states shes tolerated it a long time  5. Blood glucose elevated  R73.9 CMP w GFR   recheck labs  6. Calcification of abdominal aorta (HCC)  I70.0    monitoring  7. Encounter for medication monitoring  Z51.81 CBC w/ Diff    CMP w GFR    Lipid Panel     Return for 6 months f/up routine.   Danelle Berry, PA-C 11/22/19 3:59 PM

## 2019-11-23 LAB — COMPLETE METABOLIC PANEL WITH GFR
AG Ratio: 1.6 (calc) (ref 1.0–2.5)
ALT: 15 U/L (ref 6–29)
AST: 22 U/L (ref 10–35)
Albumin: 4.5 g/dL (ref 3.6–5.1)
Alkaline phosphatase (APISO): 65 U/L (ref 37–153)
BUN: 9 mg/dL (ref 7–25)
CO2: 29 mmol/L (ref 20–32)
Calcium: 10 mg/dL (ref 8.6–10.4)
Chloride: 101 mmol/L (ref 98–110)
Creat: 0.64 mg/dL (ref 0.60–0.93)
GFR, Est African American: 103 mL/min/{1.73_m2} (ref >=60)
GFR, Est Non African American: 89 mL/min/{1.73_m2} (ref >=60)
Globulin: 2.9 g/dL (calc) (ref 1.9–3.7)
Glucose, Bld: 107 mg/dL — ABNORMAL HIGH (ref 65–99)
Potassium: 4.4 mmol/L (ref 3.5–5.3)
Sodium: 138 mmol/L (ref 135–146)
Total Bilirubin: 0.5 mg/dL (ref 0.2–1.2)
Total Protein: 7.4 g/dL (ref 6.1–8.1)

## 2019-11-23 LAB — CBC WITH DIFFERENTIAL/PLATELET
Absolute Monocytes: 566 cells/uL (ref 200–950)
Basophils Absolute: 61 cells/uL (ref 0–200)
Basophils Relative: 0.6 %
Eosinophils Absolute: 141 cells/uL (ref 15–500)
Eosinophils Relative: 1.4 %
HCT: 43 % (ref 35.0–45.0)
Hemoglobin: 14.7 g/dL (ref 11.7–15.5)
Lymphs Abs: 3091 cells/uL (ref 850–3900)
MCH: 29.2 pg (ref 27.0–33.0)
MCHC: 34.2 g/dL (ref 32.0–36.0)
MCV: 85.5 fL (ref 80.0–100.0)
MPV: 9.5 fL (ref 7.5–12.5)
Monocytes Relative: 5.6 %
Neutro Abs: 6242 cells/uL (ref 1500–7800)
Neutrophils Relative %: 61.8 %
Platelets: 230 10*3/uL (ref 140–400)
RBC: 5.03 10*6/uL (ref 3.80–5.10)
RDW: 12.6 % (ref 11.0–15.0)
Total Lymphocyte: 30.6 %
WBC: 10.1 10*3/uL (ref 3.8–10.8)

## 2019-11-23 LAB — LIPID PANEL
Cholesterol: 245 mg/dL — ABNORMAL HIGH (ref <200)
HDL: 58 mg/dL (ref >=50)
LDL Cholesterol (Calc): 159 mg/dL (calc) — ABNORMAL HIGH
Non-HDL Cholesterol (Calc): 187 mg/dL (calc) — ABNORMAL HIGH (ref <130)
Total CHOL/HDL Ratio: 4.2 (calc) (ref <5.0)
Triglycerides: 152 mg/dL — ABNORMAL HIGH (ref <150)

## 2019-11-25 ENCOUNTER — Encounter: Payer: Self-pay | Admitting: Family Medicine

## 2020-05-10 ENCOUNTER — Other Ambulatory Visit: Payer: Self-pay

## 2020-05-10 ENCOUNTER — Ambulatory Visit (INDEPENDENT_AMBULATORY_CARE_PROVIDER_SITE_OTHER): Payer: PPO

## 2020-05-10 VITALS — BP 142/82 | HR 97 | Temp 96.9°F | Resp 16 | Ht 65.0 in | Wt 124.8 lb

## 2020-05-10 DIAGNOSIS — Z1231 Encounter for screening mammogram for malignant neoplasm of breast: Secondary | ICD-10-CM

## 2020-05-10 DIAGNOSIS — Z0000 Encounter for general adult medical examination without abnormal findings: Secondary | ICD-10-CM | POA: Diagnosis not present

## 2020-05-10 NOTE — Patient Instructions (Signed)
[name] , Thank you for taking time to come for your Medicare Wellness Visit. I appreciate your ongoing commitment to your health goals. Please review the following plan we discussed and let me know if I can assist you in the future.   Screening recommendations/referrals: Colonoscopy: done 12/11/2008. Please discuss repeat screening colonoscopy with your provider at your next office visit.  Mammogram: done 07/08/19. Please call [phone] to schedule your mammogram.  Bone Density: done 09/07/17. Due for repeat screening.  Recommended yearly ophthalmology/optometry visit for glaucoma screening and checkup Recommended yearly dental visit for hygiene and checkup  Vaccinations: Influenza vaccine: done 08/04/19 Pneumococcal vaccine: done 02/19/17 Tdap vaccine: done 11/14/16 Shingles vaccine: Shingrix discussed. Please contact your pharmacy for coverage information.  Covid-19:discussed  Advanced directives: Advance directive discussed with you today. I have provided a copy for you to complete at home and have notarized. Once this is complete please bring a copy in to our office so we can scan it into your chart.  Conditions/risks identified: Recommend increasing physical activity  Next appointment: Follow up in one year for your annual wellness visit    Preventive Care 65 Years and Older, Female Preventive care refers to lifestyle choices and visits with your health care provider that can promote health and wellness. What does preventive care include?  A yearly physical exam. This is also called an annual well check.  Dental exams once or twice a year.  Routine eye exams. Ask your health care provider how often you should have your eyes checked.  Personal lifestyle choices, including:  Daily care of your teeth and gums.  Regular physical activity.  Eating a healthy diet.  Avoiding tobacco and drug use.  Limiting alcohol use.  Practicing safe sex.  Taking low-dose aspirin every  day.  Taking vitamin and mineral supplements as recommended by your health care provider. What happens during an annual well check? The services and screenings done by your health care provider during your annual well check will depend on your age, overall health, lifestyle risk factors, and family history of disease. Counseling  Your health care provider may ask you questions about your:  Alcohol use.  Tobacco use.  Drug use.  Emotional well-being.  Home and relationship well-being.  Sexual activity.  Eating habits.  History of falls.  Memory and ability to understand (cognition).  Work and work Astronomer.  Reproductive health. Screening  You may have the following tests or measurements:  Height, weight, and BMI.  Blood pressure.  Lipid and cholesterol levels. These may be checked every 5 years, or more frequently if you are over 69 years old.  Skin check.  Lung cancer screening. You may have this screening every year starting at age 33 if you have a 30-pack-year history of smoking and currently smoke or have quit within the past 15 years.  Fecal occult blood test (FOBT) of the stool. You may have this test every year starting at age 21.  Flexible sigmoidoscopy or colonoscopy. You may have a sigmoidoscopy every 5 years or a colonoscopy every 10 years starting at age 19.  Hepatitis C blood test.  Hepatitis B blood test.  Sexually transmitted disease (STD) testing.  Diabetes screening. This is done by checking your blood sugar (glucose) after you have not eaten for a while (fasting). You may have this done every 1-3 years.  Bone density scan. This is done to screen for osteoporosis. You may have this done starting at age 8.  Mammogram. This may be  done every 1-2 years. Talk to your health care provider about how often you should have regular mammograms. Talk with your health care provider about your test results, treatment options, and if necessary, the need  for more tests. Vaccines  Your health care provider may recommend certain vaccines, such as:  Influenza vaccine. This is recommended every year.  Tetanus, diphtheria, and acellular pertussis (Tdap, Td) vaccine. You may need a Td booster every 10 years.  Zoster vaccine. You may need this after age 31.  Pneumococcal 13-valent conjugate (PCV13) vaccine. One dose is recommended after age 66.  Pneumococcal polysaccharide (PPSV23) vaccine. One dose is recommended after age 90. Talk to your health care provider about which screenings and vaccines you need and how often you need them. This information is not intended to replace advice given to you by your health care provider. Make sure you discuss any questions you have with your health care provider. Document Released: 11/23/2015 Document Revised: 07/16/2016 Document Reviewed: 08/28/2015 Elsevier Interactive Patient Education  2017 [employer].  Fall Prevention in the Home Falls can cause injuries. They can happen to people of all ages. There are many things you can do to make your home safe and to help prevent falls. What can I do on the outside of my home?  Regularly fix the edges of walkways and driveways and fix any cracks.  Remove anything that might make you trip as you walk through a door, such as a raised step or threshold.  Trim any bushes or trees on the path to your home.  Use bright outdoor lighting.  Clear any walking paths of anything that might make someone trip, such as rocks or tools.  Regularly check to see if handrails are loose or broken. Make sure that both sides of any steps have handrails.  Any raised decks and porches should have guardrails on the edges.  Have any leaves, snow, or ice cleared regularly.  Use sand or salt on walking paths during winter.  Clean up any spills in your garage right away. This includes oil or grease spills. What can I do in the bathroom?  Use night lights.  Install grab bars  by the toilet and in the tub and shower. Do not use towel bars as grab bars.  Use non-skid mats or decals in the tub or shower.  If you need to sit down in the shower, use a plastic, non-slip stool.  Keep the floor dry. Clean up any water that spills on the floor as soon as it happens.  Remove soap buildup in the tub or shower regularly.  Attach bath mats securely with double-sided non-slip rug tape.  Do not have throw rugs and other things on the floor that can make you trip. What can I do in the bedroom?  Use night lights.  Make sure that you have a light by your bed that is easy to reach.  Do not use any sheets or blankets that are too big for your bed. They should not hang down onto the floor.  Have a firm chair that has side arms. You can use this for support while you get dressed.  Do not have throw rugs and other things on the floor that can make you trip. What can I do in the kitchen?  Clean up any spills right away.  Avoid walking on wet floors.  Keep items that you use a lot in easy-to-reach places.  If you need to reach something above you, use  a strong step stool that has a grab bar.  Keep electrical cords out of the way.  Do not use floor polish or wax that makes floors slippery. If you must use wax, use non-skid floor wax.  Do not have throw rugs and other things on the floor that can make you trip. What can I do with my stairs?  Do not leave any items on the stairs.  Make sure that there are handrails on both sides of the stairs and use them. Fix handrails that are broken or loose. Make sure that handrails are as long as the stairways.  Check any carpeting to make sure that it is firmly attached to the stairs. Fix any carpet that is loose or worn.  Avoid having throw rugs at the top or bottom of the stairs. If you do have throw rugs, attach them to the floor with carpet tape.  Make sure that you have a light switch at the top of the stairs and the  bottom of the stairs. If you do not have them, ask someone to add them for you. What else can I do to help prevent falls?  Wear shoes that:  Do not have high heels.  Have rubber bottoms.  Are comfortable and fit you well.  Are closed at the toe. Do not wear sandals.  If you use a stepladder:  Make sure that it is fully opened. Do not climb a closed stepladder.  Make sure that both sides of the stepladder are locked into place.  Ask someone to hold it for you, if possible.  Clearly mark and make sure that you can see:  Any grab bars or handrails.  First and last steps.  Where the edge of each step is.  Use tools that help you move around (mobility aids) if they are needed. These include:  Canes.  Walkers.  Scooters.  Crutches.  Turn on the lights when you go into a dark area. Replace any light bulbs as soon as they burn out.  Set up your furniture so you have a clear path. Avoid moving your furniture around.  If any of your floors are uneven, fix them.  If there are any pets around you, be aware of where they are.  Review your medicines with your doctor. Some medicines can make you feel dizzy. This can increase your chance of falling. Ask your doctor what other things that you can do to help prevent falls. This information is not intended to replace advice given to you by your health care provider. Make sure you discuss any questions you have with your health care provider. Document Released: 08/23/2009 Document Revised: 04/03/2016 Document Reviewed: 12/01/2014 Elsevier Interactive Patient Education  2017 [employer].

## 2020-05-10 NOTE — Progress Notes (Signed)
Subjective:   Laura Deleon is a 74 y.o. female who presents for Medicare Annual (Subsequent) preventive examination.  Review of Systems     Cardiac Risk Factors include: hypertension;dyslipidemia;advanced age (>82men, >79 women)     Objective:    Today's Vitals   05/10/20 0936  BP: (!) 142/82  Pulse: 97  Resp: 16  Temp: (!) 96.9 F (36.1 C)  TempSrc: Temporal  SpO2: 98%  Weight: 124 lb 12.8 oz (56.6 kg)  Height: 5\' 5"  (1.651 m)   Body mass index is 20.77 kg/m.  Advanced Directives 05/10/2020 05/05/2019 04/29/2018 08/27/2017 02/19/2017 12/04/2016 11/14/2016  Does Patient Have a Medical Advance Directive? No No No No No No No  Does patient want to make changes to medical advance directive? - - - - - - -  Copy of Healthcare Power of Attorney in Chart? - - - - - - -  Would patient like information on creating a medical advance directive? Yes (MAU/Ambulatory/Procedural Areas - Information given) Yes (MAU/Ambulatory/Procedural Areas - Information given) Yes (MAU/Ambulatory/Procedural Areas - Information given) - - - -    Current Medications (verified) Outpatient Encounter Medications as of 05/10/2020  Medication Sig   aspirin 81 MG EC tablet Take 81 mg by mouth daily. Swallow whole.   ezetimibe (ZETIA) 10 MG tablet Take 1 tablet (10 mg total) by mouth daily.   fluticasone (FLONASE) 50 MCG/ACT nasal spray Place 2 sprays into both nostrils as needed.   loratadine (CLARITIN) 10 MG tablet Take 1 tablet (10 mg total) by mouth daily as needed for allergies.   metoprolol succinate (TOPROL-XL) 50 MG 24 hr tablet TAKE 1 TABLET BY MOUTH DAILY WITH OR IMMEDIATELY FOLLOWING A MEAL   Multiple Vitamin (MULTI-VITAMINS) TABS Take 1 tablet by mouth daily.   No facility-administered encounter medications on file as of 05/10/2020.    Allergies (verified) Sulfa antibiotics and Latex   History: Past Medical History:  Diagnosis Date   Allergy    Hernia of anterior abdominal wall     Hyperlipidemia    Hypertension    Osteopenia 09/08/2017   Oct 2018; next scan => Sep 10, 2019   Past Surgical History:  Procedure Laterality Date   ABDOMINAL HYSTERECTOMY     BLADDER SURGERY     HAND SURGERY  11/14/2016   removal of foreign body   HERNIA REPAIR     with mesh   Family History  Problem Relation Age of Onset   Hyperlipidemia Mother    Hypertension Mother    Hyperlipidemia Father    Emphysema Father    COPD Father    Breast cancer Neg Hx    Social History   Socioeconomic History   Marital status: Widowed    Spouse name: 01/12/2017   Number of children: 2   Years of education: Not on file   Highest education level: 12th grade  Occupational History   Occupation: Retired  Tobacco Use   Smoking status: Never Smoker   Smokeless tobacco: Never Used   Tobacco comment: smoking cessation materials not required  Vaping Use   Vaping Use: Never used  Substance and Sexual Activity   Alcohol use: No    Alcohol/week: 0.0 standard drinks   Drug use: No   Sexual activity: Not Currently  Other Topics Concern   Not on file  Social History Narrative   Husband passed away 02/22/2016, she is raising her 33 year old great grandson   Social Determinants of 5 Strain:  Low Risk    Difficulty of Paying Living Expenses: Not very hard  Food Insecurity: No Food Insecurity   Worried About Running Out of Food in the Last Year: Never true   Ran Out of Food in the Last Year: Never true  Transportation Needs: No Transportation Needs   Lack of Transportation (Medical): No   Lack of Transportation (Non-Medical): No  Physical Activity: Inactive   Days of Exercise per Week: 0 days   Minutes of Exercise per Session: 0 min  Stress: Stress Concern Present   Feeling of Stress : To some extent  Social Connections: Moderately Isolated   Frequency of Communication with Friends and Family: More than three times a week   Frequency of  Social Gatherings with Friends and Family: Three times a week   Attends Religious Services: More than 4 times per year   Active Member of Clubs or Organizations: No   Attends Banker Meetings: Never   Marital Status: Widowed    Tobacco Counseling Counseling given: Not Answered Comment: smoking cessation materials not required   Clinical Intake:  Pre-visit preparation completed: Yes  Pain : No/denies pain     BMI - recorded: 20.77 Nutritional Status: BMI of 19-24  Normal Nutritional Risks: None Diabetes: No  How often do you need to have someone help you when you read instructions, pamphlets, or other written materials from your doctor or pharmacy?: 1 - Never    Interpreter Needed?: No  Information entered by :: Reather Littler LPN   Activities of Daily Living In your present state of health, do you have any difficulty performing the following activities: 05/10/2020 11/22/2019  Hearing? N Y  Comment declines hearing aids left ear  Vision? N Y  Difficulty concentrating or making decisions? N Y  Comment - remembering  Walking or climbing stairs? N N  Dressing or bathing? N N  Doing errands, shopping? N N  Preparing Food and eating ? N -  Using the Toilet? N -  In the past six months, have you accidently leaked urine? N -  Do you have problems with loss of bowel control? N -  Managing your Medications? N -  Managing your Finances? N -  Housekeeping or managing your Housekeeping? N -  Some recent data might be hidden    Patient Care Team: Danelle Berry, PA-C as PCP - General (Family Medicine)  Indicate any recent Medical Services you may have received from other than [facility] providers in the past year (date may be approximate).     Assessment:   This is a routine wellness examination for [place].  Hearing/Vision screen  Hearing Screening   125Hz  250Hz  500Hz  1000Hz  2000Hz  3000Hz  4000Hz  6000Hz  8000Hz   Right ear:           Left ear:           Comments:  Pt denies hearing difficulty  Vision Screening Comments: Past due for eye exam. Established at [facility]  Dietary issues and exercise activities discussed: Current Exercise Habits: The patient does not participate in regular exercise at present, Exercise limited by: neurologic condition(s)  Goals     DIET - INCREASE WATER INTAKE     Recommend to drink at least 6-8 8oz glasses of water per day.      Depression Screen PHQ 2/9 Scores 05/10/2020 11/22/2019 05/11/2019 05/05/2019 02/17/2019 02/03/2019 08/17/2018  PHQ - 2 Score 1 0 0 2 0 3 0  PHQ- 9 Score 5 2 0 3 0  6 0    Fall Risk Fall Risk  05/10/2020 11/22/2019 05/11/2019 05/05/2019 02/17/2019  Falls in the past year? 0 1 1 1  0  Number falls in past yr: 0 0 0 0 -  Injury with Fall? 0 0 0 0 -  Risk for fall due to : No Fall Risks - - - -  Risk for fall due to: Comment - - - - -  Follow up Falls prevention discussed - - Falls prevention discussed -    Any stairs in or around the home? Yes  If so, are there any without handrails? Yes  Home free of loose throw rugs in walkways, pet beds, electrical cords, etc? Yes  Adequate lighting in your home to reduce risk of falls? Yes   ASSISTIVE DEVICES UTILIZED TO PREVENT FALLS:  Life alert? No  Use of a cane, walker or w/c? No  Grab bars in the bathroom? No  Shower chair or bench in shower? No  Elevated toilet seat or a handicapped toilet? Yes   TIMED UP AND GO:  Was the test performed? Yes .  Length of time to ambulate 10 feet: 5 sec.   Gait steady and fast without use of assistive device  Cognitive Function:     6CIT Screen 05/10/2020 05/05/2019 04/29/2018  What Year? 0 points 0 points 0 points  What month? 0 points 0 points 0 points  What time? 0 points 0 points 0 points  Count back from 20 0 points 0 points 0 points  Months in reverse 0 points 0 points 0 points  Repeat phrase 0 points 2 points 2 points  Total Score 0 2 2    Immunizations Immunization History  Administered  Date(s) Administered   Fluad Quad(high Dose 65+) 08/04/2019   Influenza, High Dose Seasonal PF 08/19/2016, 08/27/2017, 07/30/2018   Influenza,inj,Quad PF,6+ Mos 07/19/2015   Influenza-Unspecified 07/11/2014   Pneumococcal Conjugate-13 08/01/2014   Pneumococcal Polysaccharide-23 02/19/2017   Tdap 11/23/2007, 11/14/2016    TDAP status: Up to date   Flu Vaccine status: Up to date   Pneumococcal vaccine status: Up to date   Covid-19 vaccine status: Declined, Education has been provided regarding the importance of this vaccine but patient still declined. Advised may receive this vaccine at local pharmacy or Health Dept.or vaccine clinic. Aware to provide a copy of the vaccination record if obtained from local pharmacy or Health Dept. Verbalized acceptance and understanding.  Qualifies for Shingles Vaccine? Yes   Zostavax completed No   Shingrix Completed?: No.    Education has been provided regarding the importance of this vaccine. Patient has been advised to call insurance company to determine out of pocket expense if they have not yet received this vaccine. Advised may also receive vaccine at local pharmacy or Health Dept. Verbalized acceptance and understanding.  Screening Tests Health Maintenance  Topic Date Due   COLONOSCOPY  12/11/2018   DEXA SCAN  09/08/2019   COVID-19 Vaccine (1) 05/26/2020 (Originally 08/01/1958)   INFLUENZA VACCINE  06/10/2020   COLON CANCER SCREENING ANNUAL FOBT  06/20/2020   MAMMOGRAM  07/07/2020   TETANUS/TDAP  11/14/2026   Hepatitis C Screening  Completed   PNA vac Low Risk Adult  Completed    Health Maintenance  Health Maintenance Due  Topic Date Due   COLONOSCOPY  12/11/2018   DEXA SCAN  09/08/2019    Colorectal cancer screening: Completed 12/11/2008. Repeat every 10 years Pt declines repeat screening at this time. States she does not have anyone  to take her and does not like the prep. Advised to discuss at next visit.    Mammogram status: Completed 07/08/19. Repeat every year. Ordered today.   Bone Density status: Completed 07/08/17. Results reflect: Bone density results: OSTEOPENIA. Repeat every 2 years. Pt declines repeat screening at this time.   Lung Cancer Screening: (Low Dose CT Chest recommended if Age 76-80 years, 30 pack-year currently smoking OR have quit w/in 15years.) does not qualify.   Additional Screening:  Hepatitis C Screening: does qualify; Completed 10/12/17  Vision Screening: Recommended annual ophthalmology exams for early detection of glaucoma and other disorders of the eye. Is the patient up to date with their annual eye exam?  No  - postponed due to Covid Who is the provider or what is the name of the office in which the patient attends annual eye exams? [facility]  Dental Screening: Recommended annual dental exams for proper oral hygiene  Community Resource Referral / Chronic Care Management: CRR required this visit?  No   CCM required this visit?  No      Plan:     I have personally reviewed and noted the following in the patients chart:    Medical and social history  Use of alcohol, tobacco or illicit drugs   Current medications and supplements  Functional ability and status  Nutritional status  Physical activity  Advanced directives  List of other physicians  Hospitalizations, surgeries, and ER visits in previous 12 months  Vitals  Screenings to include cognitive, depression, and falls  Referrals and appointments  In addition, I have reviewed and discussed with patient certain preventive protocols, quality metrics, and best practice recommendations. A written personalized care plan for preventive services as well as general preventive health recommendations were provided to patient.     Reather Littler, LPN   [id number]/6282   Nurse Notes: pt discussed feeling stressed and anxious due to caring for great grandchildren and has lost several family  members and friends over the last year. Offered pt referral to C3 team for community resources for counseling/support which she declined at this time but will contact office if needed.

## 2020-05-17 ENCOUNTER — Telehealth: Payer: Self-pay | Admitting: Family Medicine

## 2020-05-17 NOTE — Chronic Care Management (AMB) (Signed)
  Chronic Care Management   Note  05/17/2020 Name: Laura Deleon MRN: [id number] DOB: 03-11-1946  Laura Deleon is a 74 y.o. year old female who is a primary care patient of Delsa Grana, [place]. I reached out to Lynden Oxford by phone today in response to a referral sent by Laura Deleon's health plan.     Laura Deleon was given information about Chronic Care Management services today including:  1. CCM service includes personalized support from designated clinical staff supervised by her physician, including individualized plan of care and coordination with other care providers 2. 24/7 contact phone numbers for assistance for urgent and routine care needs. 3. Service will only be billed when office clinical staff spend 20 minutes or more in a month to coordinate care. 4. Only one practitioner may furnish and bill the service in a calendar month. 5. The patient may stop CCM services at any time (effective at the end of the month) by phone call to the office staff. 6. The patient will be responsible for cost sharing (co-pay) of up to 20% of the service fee (after annual deductible is met).  Patient agreed to services and verbal consent obtained.   Follow up plan: Telephone appointment with care management team member scheduled for:06/13/2020  Noreene Larsson, [place], [place], [place] [address] Direct Dial: [phone] [email or web address]: [email or web address]

## 2020-05-23 ENCOUNTER — Other Ambulatory Visit: Payer: Self-pay

## 2020-05-23 ENCOUNTER — Ambulatory Visit (INDEPENDENT_AMBULATORY_CARE_PROVIDER_SITE_OTHER): Payer: PPO | Admitting: Family Medicine

## 2020-05-23 ENCOUNTER — Encounter: Payer: Self-pay | Admitting: Family Medicine

## 2020-05-23 VITALS — BP 140/90 | HR 84 | Temp 98.7°F | Resp 14 | Ht 65.0 in | Wt 125.9 lb

## 2020-05-23 DIAGNOSIS — I10 Essential (primary) hypertension: Secondary | ICD-10-CM | POA: Diagnosis not present

## 2020-05-23 DIAGNOSIS — E785 Hyperlipidemia, unspecified: Secondary | ICD-10-CM | POA: Diagnosis not present

## 2020-05-23 DIAGNOSIS — J0140 Acute pansinusitis, unspecified: Secondary | ICD-10-CM | POA: Diagnosis not present

## 2020-05-23 DIAGNOSIS — Z5181 Encounter for therapeutic drug level monitoring: Secondary | ICD-10-CM

## 2020-05-23 DIAGNOSIS — H9202 Otalgia, left ear: Secondary | ICD-10-CM | POA: Diagnosis not present

## 2020-05-23 MED ORDER — FLUTICASONE PROPIONATE 50 MCG/ACT NA SUSP: 2.0000 | NASAL | 11 refills | Status: DC | PRN

## 2020-05-23 MED ORDER — LORATADINE 10 MG PO TABS: 10.0000 mg | ORAL_TABLET | Freq: Every day | ORAL | 3 refills | Status: DC | PRN

## 2020-05-23 MED ORDER — METOPROLOL SUCCINATE ER 50 MG PO TB24: ORAL_TABLET | ORAL | 3 refills | Status: DC

## 2020-05-23 MED ORDER — ATORVASTATIN CALCIUM 10 MG PO TABS: ORAL_TABLET | ORAL | 3 refills | Status: DC

## 2020-05-23 MED ORDER — AMOXICILLIN-POT CLAVULANATE 875-125 MG PO TABS: 1.0000 | ORAL_TABLET | Freq: Two times a day (BID) | ORAL | 0 refills | Status: AC

## 2020-05-23 NOTE — Patient Instructions (Addendum)
Take the antibiotics for your sinuses  Take a zyrtec, claritin or allegra daily and start saline nasal spray daily and a steroid nasal spray 2 sprays each nostril daily like flonase or nasonex  Follow up if your headache is worsening   Take the lipitor medicine - either a whole tablet or half a tablet every other day or 2-3 days a week - just see what you are able to tolerate and we will follow up in 3 months.  Your ear pain is probably Eustachian tube dysfunction - see below   Eustachian Tube Dysfunction  Eustachian tube dysfunction refers to a condition in which a blockage develops in the narrow passage that connects the middle ear to the back of the nose (eustachian tube). The eustachian tube regulates air pressure in the middle ear by letting air move between the ear and nose. It also helps to drain fluid from the middle ear space. Eustachian tube dysfunction can affect one or both ears. When the eustachian tube does not function properly, air pressure, fluid, or both can build up in the middle ear. What are the causes? This condition occurs when the eustachian tube becomes blocked or cannot open normally. Common causes of this condition include:  Ear infections.  Colds and other infections that affect the nose, mouth, and throat (upper respiratory tract).  Allergies.  Irritation from cigarette smoke.  Irritation from stomach acid coming up into the esophagus (gastroesophageal reflux). The esophagus is the tube that carries food from the mouth to the stomach.  Sudden changes in air pressure, such as from descending in an airplane or scuba diving.  Abnormal growths in the nose or throat, such as: ? Growths that line the nose (nasal polyps). ? Abnormal growth of cells (tumors). ? Enlarged tissue at the back of the throat (adenoids). What increases the risk? You are more likely to develop this condition if:  You smoke.  You are overweight.  You are a child who  has: ? Certain birth defects of the mouth, such as cleft palate. ? Large tonsils or adenoids. What are the signs or symptoms? Common symptoms of this condition include:  A feeling of fullness in the ear.  Ear pain.  Clicking or popping noises in the ear.  Ringing in the ear.  Hearing loss.  Loss of balance.  Dizziness. Symptoms may get worse when the air pressure around you changes, such as when you travel to an area of high elevation, fly on an airplane, or go scuba diving. How is this diagnosed? This condition may be diagnosed based on:  Your symptoms.  A physical exam of your ears, nose, and throat.  Tests, such as those that measure: ? The movement of your eardrum (tympanogram). ? Your hearing (audiometry). How is this treated? Treatment depends on the cause and severity of your condition.  In mild cases, you may relieve your symptoms by moving air into your ears. This is called "popping the ears."  In more severe cases, or if you have symptoms of fluid in your ears, treatment may include: ? Medicines to relieve congestion (decongestants). ? Medicines that treat allergies (antihistamines). ? Nasal sprays or ear drops that contain medicines that reduce swelling (steroids). ? A procedure to drain the fluid in your eardrum (myringotomy). In this procedure, a small tube is placed in the eardrum to:  Drain the fluid.  Restore the air in the middle ear space. ? A procedure to insert a balloon device through the nose to inflate  the opening of the eustachian tube (balloon dilation). Follow these instructions at home: Lifestyle  Do not do any of the following until your health care provider approves: ? Travel to high altitudes. ? Fly in airplanes. ? Work in a Estate agent or room. ? Scuba dive.  Do not use any products that contain nicotine or tobacco, such as cigarettes and e-cigarettes. If you need help quitting, ask your health care provider.  Keep your ears  dry. Wear fitted earplugs during showering and bathing. Dry your ears completely after. General instructions  Take over-the-counter and prescription medicines only as told by your health care provider.  Use techniques to help pop your ears as recommended by your health care provider. These may include: ? Chewing gum. ? Yawning. ? Frequent, forceful swallowing. ? Closing your mouth, holding your nose closed, and gently blowing as if you are trying to blow air out of your nose.  Keep all follow-up visits as told by your health care provider. This is important. Contact a health care provider if:  Your symptoms do not go away after treatment.  Your symptoms come back after treatment.  You are unable to pop your ears.  You have: ? A fever. ? Pain in your ear. ? Pain in your head or neck. ? Fluid draining from your ear.  Your hearing suddenly changes.  You become very dizzy.  You lose your balance. Summary  Eustachian tube dysfunction refers to a condition in which a blockage develops in the eustachian tube.  It can be caused by ear infections, allergies, inhaled irritants, or abnormal growths in the nose or throat.  Symptoms include ear pain, hearing loss, or ringing in the ears.  Mild cases are treated with maneuvers to unblock the ears, such as yawning or ear popping.  Severe cases are treated with medicines. Surgery may also be done (rare). This information is not intended to replace advice given to you by your health care provider. Make sure you discuss any questions you have with your health care provider. Document Revised: 02/16/2018 Document Reviewed: 02/16/2018 Elsevier Patient Education  2020 [employer].

## 2020-05-23 NOTE — Progress Notes (Signed)
Name: Laura MoseLinda W Firman   MRN: [id number]    DOB: 1946/08/22   Date:05/23/2020       Progress Note  Chief Complaint  Patient presents with  . Follow-up    6 months  . Hypertension  . Hyperlipidemia     Subjective:   Laura Deleon is a 74 y.o. female, presents to clinic for   Hypertension:  Currently managed on metoprolol Pt reports good med compliance and denies any SE.  No lightheadedness, hypotension, syncope. Blood pressure today is fairly well controlled for age - I would like under 140/90, but pt is not feeling well today  BP Readings from Last 3 Encounters:  05/23/20 140/90  05/10/20 (!) 142/82  11/22/19 (!) 142/82  Pt denies CP, SOB, exertional sx, LE edema, palpitation, Ha's, visual disturbances  Hyperlipidemia: Currently treated with zetia, pt reports good med compliance -labs were done about 6 months ago. I reviewed the chart with her thoroughly today she reports that she has not tolerated statins in the past, from what I can tell she has tried Lipitor and pravastatin from 10 to 40 mg and very briefly she tried 5 mg Crestor but all that was documented was it "made her feel funny". Patient is willing to try again today. She tries to eat a healthy diet and avoid high fat foods she will occasionally have a hamburger and her weaknesses ice cream. Last Lipids: Lab Results  Component Value Date   CHOL 245 (H) 11/22/2019   HDL 58 11/22/2019   LDLCALC 159 (H) 11/22/2019   TRIG 152 (H) 11/22/2019   CHOLHDL 4.2 11/22/2019   - Denies: Chest pain, shortness of breath, myalgias, claudication  HA, sinus congestion and ear pain - left ear feels blocked. She reports chronic nasal symptoms that have been worse for the past 2 weeks she has pressure and a dull headache that she notes is across her forehead and cheeks.   She has history of migraines and history of TIA she states that she saw neurology in the past but they didn't think it was anything. HA is not severe, she denies any  visual disturbances, weakness, change in balance gait sensation or strength. She denies any focal numbness or weakness she denies any nausea or vomiting.   Current Outpatient Medications:  .  aspirin 81 MG EC tablet, Take 81 mg by mouth daily. Swallow whole., Disp: , Rfl:  .  ezetimibe (ZETIA) 10 MG tablet, Take 1 tablet (10 mg total) by mouth daily., Disp: 90 tablet, Rfl: 3 .  fluticasone (FLONASE) 50 MCG/ACT nasal spray, Place 2 sprays into both nostrils as needed., Disp: 1 g, Rfl: 11 .  loratadine (CLARITIN) 10 MG tablet, Take 1 tablet (10 mg total) by mouth daily as needed for allergies., Disp: 90 tablet, Rfl: 3 .  metoprolol succinate (TOPROL-XL) 50 MG 24 hr tablet, TAKE 1 TABLET BY MOUTH DAILY WITH OR IMMEDIATELY FOLLOWING A MEAL, Disp: 90 tablet, Rfl: 3 .  Multiple Vitamin (MULTI-VITAMINS) TABS, Take 1 tablet by mouth daily., Disp: , Rfl:   Patient Active Problem List   Diagnosis Date Noted  . Hx of transient ischemic attack (TIA) 02/17/2019  . Peripheral neuropathy 08/17/2018  . Contracture of joint of finger of right hand 11/11/2017  . Osteopenia 09/08/2017  . Gallstone 12/16/2016  . Calcification of abdominal aorta (HCC) 12/16/2016  . Abdominal wall hernia 07/19/2015  . Allergic rhinitis, seasonal 07/19/2015  . Blood glucose elevated 07/19/2015  . Hypertension goal BP (blood pressure) <  140/90 07/19/2015  . Pure hypercholesterolemia 07/19/2015  . 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  . Hyperlipidemia LDL goal <70 07/19/2015    Past Surgical History:  Procedure Laterality Date  . ABDOMINAL HYSTERECTOMY    . BLADDER SURGERY    . HAND SURGERY  11/14/2016   removal of foreign body  . HERNIA REPAIR     with mesh    Family History  Problem Relation Age of Onset  . Hyperlipidemia Mother   . Hypertension Mother   . Hyperlipidemia Father   . Emphysema Father   . COPD Father   . Breast cancer Neg Hx     Social History   Tobacco Use  . Smoking status: Never Smoker  .  Smokeless tobacco: Never Used  . Tobacco comment: smoking cessation materials not required  Vaping Use  . Vaping Use: Never used  Substance Use Topics  . Alcohol use: No    Alcohol/week: 0.0 standard drinks  . Drug use: No     Allergies  Allergen Reactions  . Sulfa Antibiotics Shortness Of Breath  . Latex     Health Maintenance  Topic Date Due  . COLONOSCOPY  12/11/2018  . DEXA SCAN  09/08/2019  . COVID-19 Vaccine (1) 05/26/2020 (Originally 08/01/1958)  . INFLUENZA VACCINE  06/10/2020  . COLON CANCER SCREENING ANNUAL FOBT  06/20/2020  . MAMMOGRAM  07/07/2020  . TETANUS/TDAP  11/14/2026  . Hepatitis C Screening  Completed  . PNA vac Low Risk Adult  Completed    Chart Review Today: I personally reviewed active problem list, medication list, allergies, family history, social history, health maintenance, notes from last encounter, lab results, imaging with the patient/caregiver today.  Review of Systems  10 Systems reviewed and are negative for acute change except as noted in the HPI.  Objective:   Vitals:   05/23/20 1009  BP: 140/90  Pulse: 84  Resp: 14  Temp: 98.7 F (37.1 C)  TempSrc: Temporal  SpO2: 97%  Weight: 125 lb 14.4 oz (57.1 kg)  Height: 5\' 5"  (1.651 m)    Body mass index is 20.95 kg/m.  Physical Exam Vitals and nursing note reviewed.  Constitutional:      General: She is not in acute distress.    Appearance: She is well-developed and well-groomed. She is not ill-appearing, toxic-appearing or diaphoretic.     Interventions: Face mask in place.     Comments: Appears slightly frail and looks mildly ill (slightly under the weather)  HENT:     Head: Normocephalic and atraumatic.     Right Ear: Hearing, ear canal and external ear normal. No drainage, swelling or tenderness. A middle ear effusion is present. Tympanic membrane is not perforated, erythematous, retracted or bulging.     Left Ear: Hearing, ear canal and external ear normal. No drainage,  swelling or tenderness.  No middle ear effusion. Tympanic membrane is not perforated, erythematous, retracted or bulging.     Ears:     Comments: Serous effusion right ear, normal left tm    Nose: Mucosal edema present.     Right Sinus: Maxillary sinus tenderness and frontal sinus tenderness present.     Left Sinus: Maxillary sinus tenderness and frontal sinus tenderness present.     Comments: Diffusely erythematous nasal mucosa and posterior OP    Mouth/Throat:     Mouth: Mucous membranes are moist.     Pharynx: Uvula midline. Posterior oropharyngeal erythema present. No oropharyngeal exudate or uvula swelling.     Tonsils: 0  on the right. 0 on the left.  Eyes:     General: Lids are normal. No scleral icterus.       Right eye: No discharge.        Left eye: No discharge.     Conjunctiva/sclera: Conjunctivae normal.  Neck:     Trachea: Phonation normal. No tracheal deviation.  Cardiovascular:     Rate and Rhythm: Normal rate and regular rhythm.     Pulses: Normal pulses.          Radial pulses are 2+ on the right side and 2+ on the left side.       Posterior tibial pulses are 2+ on the right side and 2+ on the left side.     Heart sounds: Normal heart sounds. No murmur heard.  No friction rub. No gallop.   Pulmonary:     Effort: Pulmonary effort is normal. No respiratory distress.     Breath sounds: Normal breath sounds. No stridor. No wheezing, rhonchi or rales.  Chest:     Chest wall: No tenderness.  Abdominal:     General: Bowel sounds are normal. There is no distension.     Palpations: Abdomen is soft.     Tenderness: There is no abdominal tenderness. There is no guarding or rebound.  Musculoskeletal:        General: No deformity. Normal range of motion.     Cervical back: Normal range of motion and neck supple.     Right lower leg: No edema.     Left lower leg: No edema.  Lymphadenopathy:     Cervical: No cervical adenopathy.  Skin:    General: Skin is warm and dry.      Capillary Refill: Capillary refill takes less than 2 seconds.     Coloration: Skin is not jaundiced or pale.     Findings: No rash.  Neurological:     Mental Status: She is alert and oriented to person, place, and time.     Motor: No weakness or abnormal muscle tone.     Coordination: Coordination normal.     Gait: Gait normal.  Psychiatric:        Mood and Affect: Mood normal.        Speech: Speech normal.        Behavior: Behavior normal. Behavior is cooperative.         Assessment & Plan:     ICD-10-CM   1. Essential hypertension  I10 COMPLETE METABOLIC PANEL WITH GFR   stable, fairly well controlled on metoprolol, no SE, no current sx  2. Hyperlipidemia LDL goal <70  E78.5 Lipid panel   after lengthy discussion pt willing to try low dose statin - info given on diet and statin meds, will try lipitor 10 mg (either 5 mg every other day)   3. Subacute pansinusitis  J01.40    indication for abx, with more than 2 weeks worsening sx and sinus ttp, finish abx, encouraged to take with food, daily allergy meds   4. Encounter for medication monitoring  Z51.81 Lipid panel    COMPLETE METABOLIC PANEL WITH GFR  5. Left ear pain  H92.02    suspect eustachian tube dysfunction - pt does not tolerate decongestants - encouraged tx sinus infection, keep taking allergy meds and steroid nasal sprays   Instructions to patient: Take the antibiotics for your sinuses  Take a zyrtec, claritin or allegra daily and start saline nasal spray daily and a steroid nasal spray  2 sprays each nostril daily like flonase or nasonex  Follow up if your headache is worsening   Take the lipitor medicine - either a whole tablet or half a tablet every other day or 2-3 days a week - just see what you are able to tolerate and we will follow up in 3 months.  Your ear pain is probably Eustachian tube dysfunction - see below  Return in about 3 months (around 08/23/2020) for Routine follow-up HLD/labs check tolerance  of lipitor.   Danelle Berry, PA-C 05/23/20 10:41 AM

## 2020-06-06 ENCOUNTER — Telehealth: Payer: Self-pay | Admitting: Family Medicine

## 2020-06-06 MED ORDER — FLUTICASONE PROPIONATE 50 MCG/ACT NA SUSP: 2.0000 | NASAL | 11 refills | Status: DC | PRN

## 2020-06-06 NOTE — Telephone Encounter (Signed)
[facility] calling to clarify the directions on the   fluticasone (FLONASE) 50 MCG/ACT nasal spray "Route: Place 2 sprays into both nostrils as needed" is current instructions. Pharmacist states they need how many times a day.  Cannot say as needed. Please call to clarify.

## 2020-06-13 ENCOUNTER — Ambulatory Visit: Payer: PPO

## 2020-06-20 NOTE — Chronic Care Management (AMB) (Signed)
  Chronic Care Management   Outreach Note   Name: Laura Deleon MRN: [id number] DOB: [birth year]  Primary Care Provider: Danelle Berry, PA-C Reason for referral : Chronic Care Management   [name] was referred to the case management team for assistance with care management and care coordination. She is interested in participating in the chronic care management program but reports being unable to complete the scheduled outreach today. Anticipates being available within the next few weeks. Provided direct contact information. She agreed to call if needed prior to the tentatively scheduled outreach.    Follow Up Plan: A member of the care management team will follow-up with [name] later this month.    [facility] Care Management [phone]

## 2020-07-03 ENCOUNTER — Emergency Department: Payer: PPO

## 2020-07-03 ENCOUNTER — Inpatient Hospital Stay
Admission: EM | Admit: 2020-07-03 | Discharge: 2020-07-09 | DRG: 522 | Disposition: A | Discharge status: Skilled Nursing Facility | Payer: PPO | Attending: Internal Medicine | Admitting: Internal Medicine

## 2020-07-03 ENCOUNTER — Other Ambulatory Visit: Payer: Self-pay

## 2020-07-03 DIAGNOSIS — Z7982 Long term (current) use of aspirin: Secondary | ICD-10-CM

## 2020-07-03 DIAGNOSIS — Z825 Family history of asthma and other chronic lower respiratory diseases: Secondary | ICD-10-CM

## 2020-07-03 DIAGNOSIS — Z91040 Latex allergy status: Secondary | ICD-10-CM | POA: Diagnosis not present

## 2020-07-03 DIAGNOSIS — K5909 Other constipation: Secondary | ICD-10-CM | POA: Diagnosis not present

## 2020-07-03 DIAGNOSIS — R0689 Other abnormalities of breathing: Secondary | ICD-10-CM | POA: Diagnosis not present

## 2020-07-03 DIAGNOSIS — D696 Thrombocytopenia, unspecified: Secondary | ICD-10-CM | POA: Diagnosis present

## 2020-07-03 DIAGNOSIS — K469 Unspecified abdominal hernia without obstruction or gangrene: Secondary | ICD-10-CM | POA: Diagnosis not present

## 2020-07-03 DIAGNOSIS — Z8249 Family history of ischemic heart disease and other diseases of the circulatory system: Secondary | ICD-10-CM

## 2020-07-03 DIAGNOSIS — W19XXXD Unspecified fall, subsequent encounter: Secondary | ICD-10-CM | POA: Diagnosis not present

## 2020-07-03 DIAGNOSIS — W19XXXA Unspecified fall, initial encounter: Secondary | ICD-10-CM

## 2020-07-03 DIAGNOSIS — W010XXA Fall on same level from slipping, tripping and stumbling without subsequent striking against object, initial encounter: Secondary | ICD-10-CM | POA: Diagnosis present

## 2020-07-03 DIAGNOSIS — Z882 Allergy status to sulfonamides status: Secondary | ICD-10-CM | POA: Diagnosis not present

## 2020-07-03 DIAGNOSIS — D72829 Elevated white blood cell count, unspecified: Secondary | ICD-10-CM | POA: Diagnosis not present

## 2020-07-03 DIAGNOSIS — Z20822 Contact with and (suspected) exposure to covid-19: Secondary | ICD-10-CM | POA: Diagnosis not present

## 2020-07-03 DIAGNOSIS — Y92009 Unspecified place in unspecified non-institutional (private) residence as the place of occurrence of the external cause: Secondary | ICD-10-CM | POA: Diagnosis not present

## 2020-07-03 DIAGNOSIS — Z043 Encounter for examination and observation following other accident: Secondary | ICD-10-CM | POA: Diagnosis not present

## 2020-07-03 DIAGNOSIS — R52 Pain, unspecified: Secondary | ICD-10-CM | POA: Diagnosis not present

## 2020-07-03 DIAGNOSIS — E569 Vitamin deficiency, unspecified: Secondary | ICD-10-CM | POA: Diagnosis not present

## 2020-07-03 DIAGNOSIS — Z79899 Other long term (current) drug therapy: Secondary | ICD-10-CM | POA: Diagnosis not present

## 2020-07-03 DIAGNOSIS — R110 Nausea: Secondary | ICD-10-CM | POA: Diagnosis not present

## 2020-07-03 DIAGNOSIS — I440 Atrioventricular block, first degree: Secondary | ICD-10-CM

## 2020-07-03 DIAGNOSIS — K439 Ventral hernia without obstruction or gangrene: Secondary | ICD-10-CM | POA: Diagnosis not present

## 2020-07-03 DIAGNOSIS — Z96649 Presence of unspecified artificial hip joint: Secondary | ICD-10-CM

## 2020-07-03 DIAGNOSIS — R739 Hyperglycemia, unspecified: Secondary | ICD-10-CM | POA: Diagnosis not present

## 2020-07-03 DIAGNOSIS — E871 Hypo-osmolality and hyponatremia: Secondary | ICD-10-CM | POA: Diagnosis present

## 2020-07-03 DIAGNOSIS — E785 Hyperlipidemia, unspecified: Secondary | ICD-10-CM | POA: Diagnosis not present

## 2020-07-03 DIAGNOSIS — S72002D Fracture of unspecified part of neck of left femur, subsequent encounter for closed fracture with routine healing: Secondary | ICD-10-CM | POA: Diagnosis not present

## 2020-07-03 DIAGNOSIS — F0390 Unspecified dementia without behavioral disturbance: Secondary | ICD-10-CM | POA: Diagnosis not present

## 2020-07-03 DIAGNOSIS — S72002A Fracture of unspecified part of neck of left femur, initial encounter for closed fracture: Secondary | ICD-10-CM | POA: Diagnosis not present

## 2020-07-03 DIAGNOSIS — Z09 Encounter for follow-up examination after completed treatment for conditions other than malignant neoplasm: Secondary | ICD-10-CM

## 2020-07-03 DIAGNOSIS — R262 Difficulty in walking, not elsewhere classified: Secondary | ICD-10-CM | POA: Diagnosis not present

## 2020-07-03 DIAGNOSIS — R000 Tachycardia, unspecified: Secondary | ICD-10-CM | POA: Diagnosis not present

## 2020-07-03 DIAGNOSIS — E876 Hypokalemia: Secondary | ICD-10-CM

## 2020-07-03 DIAGNOSIS — I700 Atherosclerosis of aorta: Secondary | ICD-10-CM | POA: Diagnosis not present

## 2020-07-03 DIAGNOSIS — Z96642 Presence of left artificial hip joint: Secondary | ICD-10-CM | POA: Diagnosis not present

## 2020-07-03 DIAGNOSIS — M80852D Other osteoporosis with current pathological fracture, left femur, subsequent encounter for fracture with routine healing: Secondary | ICD-10-CM | POA: Diagnosis not present

## 2020-07-03 DIAGNOSIS — R5381 Other malaise: Secondary | ICD-10-CM | POA: Diagnosis not present

## 2020-07-03 DIAGNOSIS — R279 Unspecified lack of coordination: Secondary | ICD-10-CM | POA: Diagnosis not present

## 2020-07-03 DIAGNOSIS — I10 Essential (primary) hypertension: Secondary | ICD-10-CM | POA: Diagnosis present

## 2020-07-03 DIAGNOSIS — Z471 Aftercare following joint replacement surgery: Secondary | ICD-10-CM | POA: Diagnosis not present

## 2020-07-03 DIAGNOSIS — Y9301 Activity, walking, marching and hiking: Secondary | ICD-10-CM | POA: Diagnosis present

## 2020-07-03 DIAGNOSIS — M25552 Pain in left hip: Secondary | ICD-10-CM | POA: Diagnosis not present

## 2020-07-03 DIAGNOSIS — R488 Other symbolic dysfunctions: Secondary | ICD-10-CM | POA: Diagnosis not present

## 2020-07-03 DIAGNOSIS — S72042A Displaced fracture of base of neck of left femur, initial encounter for closed fracture: Secondary | ICD-10-CM | POA: Diagnosis not present

## 2020-07-03 DIAGNOSIS — Z83438 Family history of other disorder of lipoprotein metabolism and other lipidemia: Secondary | ICD-10-CM

## 2020-07-03 DIAGNOSIS — R0902 Hypoxemia: Secondary | ICD-10-CM | POA: Diagnosis not present

## 2020-07-03 DIAGNOSIS — M6281 Muscle weakness (generalized): Secondary | ICD-10-CM | POA: Diagnosis not present

## 2020-07-03 DIAGNOSIS — Z743 Need for continuous supervision: Secondary | ICD-10-CM | POA: Diagnosis not present

## 2020-07-03 DIAGNOSIS — J3089 Other allergic rhinitis: Secondary | ICD-10-CM | POA: Diagnosis not present

## 2020-07-03 DIAGNOSIS — S72052A Unspecified fracture of head of left femur, initial encounter for closed fracture: Secondary | ICD-10-CM | POA: Diagnosis not present

## 2020-07-03 HISTORY — DX: Fracture of unspecified part of neck of left femur, initial encounter for closed fracture: S72.002A

## 2020-07-03 LAB — CBC WITH DIFFERENTIAL/PLATELET
Abs Immature Granulocytes: 0.08 10*3/uL — ABNORMAL HIGH (ref 0.00–0.07)
Basophils Absolute: 0.1 10*3/uL (ref 0.0–0.1)
Basophils Relative: 1 %
Eosinophils Absolute: 0.2 10*3/uL (ref 0.0–0.5)
Eosinophils Relative: 1 %
HCT: 43.6 % (ref 36.0–46.0)
Hemoglobin: 14.4 g/dL (ref 12.0–15.0)
Immature Granulocytes: 1 %
Lymphocytes Relative: 25 %
Lymphs Abs: 3.1 10*3/uL (ref 0.7–4.0)
MCH: 29 pg (ref 26.0–34.0)
MCHC: 33 g/dL (ref 30.0–36.0)
MCV: 87.7 fL (ref 80.0–100.0)
Monocytes Absolute: 0.8 10*3/uL (ref 0.1–1.0)
Monocytes Relative: 6 %
Neutro Abs: 8.3 10*3/uL — ABNORMAL HIGH (ref 1.7–7.7)
Neutrophils Relative %: 66 %
Platelets: 219 10*3/uL (ref 150–400)
RBC: 4.97 MIL/uL (ref 3.87–5.11)
RDW: 13.1 % (ref 11.5–15.5)
WBC: 12.5 10*3/uL — ABNORMAL HIGH (ref 4.0–10.5)
nRBC: 0 % (ref 0.0–0.2)

## 2020-07-03 LAB — SAMPLE TO BLOOD BANK

## 2020-07-03 LAB — COMPREHENSIVE METABOLIC PANEL
ALT: 21 U/L (ref 0–44)
AST: 30 U/L (ref 15–41)
Albumin: 4.1 g/dL (ref 3.5–5.0)
Alkaline Phosphatase: 56 U/L (ref 38–126)
Anion gap: 10 (ref 5–15)
BUN: 12 mg/dL (ref 8–23)
CO2: 27 mmol/L (ref 22–32)
Calcium: 9 mg/dL (ref 8.9–10.3)
Chloride: 99 mmol/L (ref 98–111)
Creatinine, Ser: 0.54 mg/dL (ref 0.44–1.00)
GFR calc Af Amer: >60 mL/min (ref >60)
GFR calc non Af Amer: >60 mL/min (ref >60)
Glucose, Bld: 202 mg/dL — ABNORMAL HIGH (ref 70–99)
Potassium: 3.3 mmol/L — ABNORMAL LOW (ref 3.5–5.1)
Sodium: 136 mmol/L (ref 135–145)
Total Bilirubin: 0.5 mg/dL (ref 0.3–1.2)
Total Protein: 7.1 g/dL (ref 6.5–8.1)

## 2020-07-03 LAB — PROTIME-INR
INR: 1 (ref 0.8–1.2)
Prothrombin Time: 13.1 seconds (ref 11.4–15.2)

## 2020-07-03 LAB — SARS CORONAVIRUS 2 BY RT PCR (HOSPITAL ORDER, PERFORMED IN ~~LOC~~ HOSPITAL LAB): SARS Coronavirus 2: NEGATIVE

## 2020-07-03 MED ORDER — CEFAZOLIN SODIUM-DEXTROSE 1-4 GM/50ML-% IV SOLN
1.0000 g | Freq: Once | INTRAVENOUS | Status: AC
  Administered 2020-07-04: 1 g via INTRAVENOUS
  Filled 2020-07-03: qty 50

## 2020-07-03 MED ORDER — SODIUM CHLORIDE 0.9 % IV SOLN: INTRAVENOUS | Status: DC

## 2020-07-03 MED ORDER — ACETAMINOPHEN 650 MG RE SUPP: 650.0000 mg | Freq: Four times a day (QID) | RECTAL | Status: DC | PRN

## 2020-07-03 MED ORDER — FENTANYL CITRATE (PF) 100 MCG/2ML IJ SOLN
50.0000 ug | Freq: Once | INTRAMUSCULAR | Status: AC
  Administered 2020-07-03: 50 ug via INTRAVENOUS
  Filled 2020-07-03: qty 2

## 2020-07-03 MED ORDER — ADULT MULTIVITAMIN W/MINERALS CH
1.0000 | ORAL_TABLET | Freq: Every day | ORAL | Status: DC
  Administered 2020-07-04 – 2020-07-09 (×6): 1 via ORAL
  Filled 2020-07-03 (×6): qty 1

## 2020-07-03 MED ORDER — LORATADINE 10 MG PO TABS: 10.0000 mg | ORAL_TABLET | Freq: Every day | ORAL | Status: DC | PRN

## 2020-07-03 MED ORDER — OXYCODONE-ACETAMINOPHEN 5-325 MG PO TABS: 1.0000 | ORAL_TABLET | Freq: Four times a day (QID) | ORAL | Status: DC | PRN

## 2020-07-03 MED ORDER — MORPHINE SULFATE (PF) 2 MG/ML IV SOLN
2.0000 mg | INTRAVENOUS | Status: DC | PRN
  Administered 2020-07-04 (×2): 2 mg via INTRAVENOUS
  Filled 2020-07-03 (×2): qty 1

## 2020-07-03 MED ORDER — ONDANSETRON HCL 4 MG PO TABS: 4.0000 mg | ORAL_TABLET | Freq: Four times a day (QID) | ORAL | Status: DC | PRN

## 2020-07-03 MED ORDER — EZETIMIBE 10 MG PO TABS
10.0000 mg | ORAL_TABLET | Freq: Every day | ORAL | Status: DC
  Administered 2020-07-04 – 2020-07-09 (×6): 10 mg via ORAL
  Filled 2020-07-03 (×7): qty 1

## 2020-07-03 MED ORDER — ONDANSETRON HCL 4 MG/2ML IJ SOLN: 4.0000 mg | Freq: Four times a day (QID) | INTRAMUSCULAR | Status: DC | PRN

## 2020-07-03 MED ORDER — ATORVASTATIN CALCIUM 10 MG PO TABS
10.0000 mg | ORAL_TABLET | ORAL | Status: DC
  Administered 2020-07-04 – 2020-07-08 (×3): 10 mg via ORAL
  Filled 2020-07-03 (×3): qty 1

## 2020-07-03 MED ORDER — METOPROLOL SUCCINATE ER 50 MG PO TB24
50.0000 mg | ORAL_TABLET | Freq: Every day | ORAL | Status: DC
  Administered 2020-07-04 – 2020-07-09 (×6): 50 mg via ORAL
  Filled 2020-07-03 (×6): qty 1

## 2020-07-03 MED ORDER — FLUTICASONE PROPIONATE 50 MCG/ACT NA SUSP
2.0000 | Freq: Every day | NASAL | Status: DC
  Administered 2020-07-04: 2 via NASAL
  Filled 2020-07-03 (×2): qty 16

## 2020-07-03 MED ORDER — MAGNESIUM HYDROXIDE 400 MG/5ML PO SUSP
30.0000 mL | Freq: Every day | ORAL | Status: DC | PRN
  Administered 2020-07-05 – 2020-07-07 (×2): 30 mL via ORAL
  Filled 2020-07-03 (×3): qty 30

## 2020-07-03 MED ORDER — TRAZODONE HCL 50 MG PO TABS: 25.0000 mg | ORAL_TABLET | Freq: Every evening | ORAL | Status: DC | PRN

## 2020-07-03 MED ORDER — ACETAMINOPHEN 325 MG PO TABS: 650.0000 mg | ORAL_TABLET | Freq: Four times a day (QID) | ORAL | Status: DC | PRN

## 2020-07-03 MED ORDER — INSULIN ASPART 100 UNIT/ML ~~LOC~~ SOLN: 0.0000 [IU] | SUBCUTANEOUS | Status: DC

## 2020-07-03 MED ORDER — POTASSIUM CHLORIDE CRYS ER 20 MEQ PO TBCR
40.0000 meq | EXTENDED_RELEASE_TABLET | Freq: Once | ORAL | Status: DC
  Filled 2020-07-03 (×2): qty 2

## 2020-07-03 NOTE — ED Provider Notes (Addendum)
[facility] Emergency Department Provider Note  ____________________________________________   First MD Initiated Contact with Patient 07/03/20 2221     (approximate)  I have reviewed the triage vital signs and the nursing notes.   HISTORY  Chief Complaint Fall   HPI Laura Deleon is a 74 y.o. female with a past medical history of an anterior abdominal wall hernia, HTN, HDL, and osteopenia who presents for assessment from home via EMS after she tripped and fell while walking her kitchen onto her left hip.  Patient states she tripped over a wooden box.  She denies striking her head or any LOC.  Denies any pain in her head, neck, arms, chest, back, right hip, bilateral knees, ankles, feet, or anywhere else.  No other recent falls or injuries.  Patient does endorse some nausea after receiving fentanyl with EMS she did also receive some Zofran with EMS.  No other recent falls or injuries.  Patient states otherwise she has been in her usual state of health without any recent fevers, chills, cough, vomiting, diarrhea, dysuria, or other acute complaints.  Denies being on any anticoagulation.         Past Medical History:  Diagnosis Date  . Allergy   . Hernia of anterior abdominal wall   . Hyperlipidemia   . Hypertension   . Osteopenia 09/08/2017   Oct 2018; next scan => Sep 10, 2019    Patient Active Problem List   Diagnosis Date Noted  . Closed left hip fracture (HCC) 07/03/2020  . Hx of transient ischemic attack (TIA) 02/17/2019  . Peripheral neuropathy 08/17/2018  . Contracture of joint of finger of right hand 11/11/2017  . Osteopenia 09/08/2017  . Gallstone 12/16/2016  . Calcification of abdominal aorta (HCC) 12/16/2016  . Abdominal wall hernia 07/19/2015  . Allergic rhinitis, seasonal 07/19/2015  . Blood glucose elevated 07/19/2015  . Hypertension goal BP (blood pressure) < 140/90 07/19/2015  . Pure hypercholesterolemia 07/19/2015  . Atrophy of  vagina 07/19/2015  . Hyperlipidemia LDL goal <70 07/19/2015    Past Surgical History:  Procedure Laterality Date  . ABDOMINAL HYSTERECTOMY    . BLADDER SURGERY    . HAND SURGERY  11/14/2016   removal of foreign body  . HERNIA REPAIR     with mesh    Prior to Admission medications   Medication Sig Start Date End Date Taking? Authorizing Provider  ezetimibe (ZETIA) 10 MG tablet Take 1 tablet (10 mg total) by mouth daily. 11/15/19  Yes Danelle Berry, PA-C  metoprolol succinate (TOPROL-XL) 50 MG 24 hr tablet TAKE 1 TABLET BY MOUTH DAILY WITH OR IMMEDIATELY FOLLOWING A MEAL Patient taking differently: Take 50 mg by mouth daily.  05/23/20  Yes Danelle Berry, PA-C  aspirin 81 MG EC tablet Take 81 mg by mouth daily. Swallow whole.    [provider]  atorvastatin (LIPITOR) 10 MG tablet Take 10 mg PO every other day at bedtime 05/23/20   Danelle Berry, PA-C  fluticasone (FLONASE) 50 MCG/ACT nasal spray Place 2 sprays into both nostrils as needed. 06/06/20   Danelle Berry, PA-C  loratadine (CLARITIN) 10 MG tablet Take 1 tablet (10 mg total) by mouth daily as needed for allergies. 05/23/20   Danelle Berry, PA-C  Multiple Vitamin (MULTI-VITAMINS) TABS Take 1 tablet by mouth daily. 02/01/09   [provider]    Allergies Sulfa antibiotics and Latex  Family History  Problem Relation Age of Onset  . Hyperlipidemia Mother   . Hypertension  Mother   . Hyperlipidemia Father   . Emphysema Father   . COPD Father   . Breast cancer Neg Hx     Social History Social History   Tobacco Use  . Smoking status: Never Smoker  . Smokeless tobacco: Never Used  . Tobacco comment: smoking cessation materials not required  Vaping Use  . Vaping Use: Never used  Substance Use Topics  . Alcohol use: No    Alcohol/week: 0.0 standard drinks  . Drug use: No    Review of Systems  Review of Systems  Constitutional: Negative for chills and fever.  HENT: Negative for sore throat.   Eyes: Negative  for pain.  Respiratory: Negative for cough and stridor.   Cardiovascular: Negative for chest pain.  Gastrointestinal: Negative for vomiting.  Musculoskeletal: Positive for falls, joint pain ( L hip) and myalgias ( L hip).  Skin: Negative for rash.  Neurological: Negative for seizures, loss of consciousness and headaches.  Psychiatric/Behavioral: Negative for suicidal ideas.  All other systems reviewed and are negative.     ____________________________________________   PHYSICAL EXAM:  VITAL SIGNS: ED Triage Vitals  Enc Vitals Group     BP      Pulse      Resp      Temp      Temp src      SpO2      Weight      Height      Head Circumference      Peak Flow      Pain Score      Pain Loc      Pain Edu?      Excl. in GC?    Vitals:   07/03/20 2235 07/03/20 2236  BP:    Pulse:  86  Temp:    SpO2: 95% 94%   Physical Exam Vitals and nursing note reviewed.  Constitutional:      General: She is in acute distress.     Appearance: She is well-developed.  HENT:     Head: Normocephalic and atraumatic.     Right Ear: External ear normal.     Left Ear: External ear normal.     Nose: Nose normal.     Mouth/Throat:     Mouth: Mucous membranes are moist.  Eyes:     Conjunctiva/sclera: Conjunctivae normal.  Cardiovascular:     Rate and Rhythm: Normal rate and regular rhythm.     Heart sounds: No murmur heard.   Pulmonary:     Effort: Pulmonary effort is normal. No respiratory distress.     Breath sounds: Normal breath sounds.  Abdominal:     Palpations: Abdomen is soft.     Tenderness: There is no abdominal tenderness.  Musculoskeletal:     Cervical back: Neck supple.     Left hip: Tenderness and bony tenderness present. Decreased range of motion. Decreased strength.     Right lower leg: No edema.     Left lower leg: No edema.  Skin:    General: Skin is warm and dry.     Capillary Refill: Capillary refill takes less than 2 seconds.  Neurological:     Mental  Status: She is alert and oriented to person, place, and time.  Psychiatric:        Mood and Affect: Mood normal.     Patient has full strength in her right lower extremity and is able to move her toes left lower extremity.  2+ bilateral DP pulses.  Sensation intact light touch throughout both lower extremities.  No tenderness over the C or T-spine. ____________________________________________   LABS (all labs ordered are listed, but only abnormal results are displayed)  Labs Reviewed  CBC WITH DIFFERENTIAL/PLATELET - Abnormal; Notable for the following components:      Result Value   WBC 12.5 (*)    Neutro Abs 8.3 (*)    Abs Immature Granulocytes 0.08 (*)    All other components within normal limits  COMPREHENSIVE METABOLIC PANEL - Abnormal; Notable for the following components:   Potassium 3.3 (*)    Glucose, Bld 202 (*)    All other components within normal limits  SARS CORONAVIRUS 2 BY RT PCR (HOSPITAL ORDER, PERFORMED IN [facility])  PROTIME-INR  HEMOGLOBIN A1C  BASIC METABOLIC PANEL  CBC  SAMPLE TO BLOOD BANK   ____________________________________________  __EKG remarkable for sinus rhythm with a ventricular rate of 91, first-degree block with a PR interval of 240, poor tracing in leads I and II with no other clear evidence of acute ischemia or other significant underlying arrhythmia.  __________________________________________  RADIOLOGY  ED MD interpretation: Left femoral neck fracture.  Official radiology report(s): DG Chest Port 1 View  Result Date: 07/03/2020 CLINICAL DATA:  Left hip fracture.  Post fall. EXAM: PORTABLE CHEST 1 VIEW COMPARISON:  Chest radiograph 08/17/2018 FINDINGS: The cardiomediastinal contours are normal. Mild aortic atherosclerosis. Pulmonary vasculature is normal. No consolidation, pleural effusion, or pneumothorax. No acute osseous abnormalities are seen. IMPRESSION: No acute chest findings. Electronically Signed   By: Narda RutherfordMelanie   Sanford M.D.   On: 07/03/2020 23:07   DG Hip Unilat W or Wo Pelvis 2-3 Views Left  Result Date: 07/03/2020 CLINICAL DATA:  Fall, left hip pain EXAM: DG HIP (WITH OR WITHOUT PELVIS) 2-3V LEFT COMPARISON:  None. FINDINGS: There is a left femoral neck fracture with varus angulation. No subluxation or dislocation. SI joints symmetric. IMPRESSION: Left femoral neck fracture with varus angulation. Electronically Signed   By: Charlett NoseKevin  Dover M.D.   On: 07/03/2020 23:06    ____________________________________________   PROCEDURES  Procedure(s) performed (including Critical Care):  Procedures   ____________________________________________   INITIAL IMPRESSION / ASSESSMENT AND PLAN / ED COURSE        Overall patient's history, exam, and ED work-up is most consistent with left femoral neck fracture secondary to ground-level mechanical fall.  Patient is hypertensive with otherwise stable vital signs on room air.  Patient is otherwise neurovascular intact in the left lower extremity and there is no evidence on exam or history of pain to suggest injuries in the other extremities, chest, abdomen, torso, face, neck, or elsewhere.  Patient should be slightly hypokalemic and hyperglycemic.  Potassium was repleted and she was started on every 4 short acting insulin.  Discussed patient with on-call orthopedist who recommended hospital admission.  Patient admitted in stable condition.  Medications  potassium chloride SA (KLOR-CON) CR tablet 40 mEq (has no administration in time range)  insulin aspart (novoLOG) injection 0-15 Units (has no administration in time range)  oxyCODONE-acetaminophen (PERCOCET/ROXICET) 5-325 MG per tablet 1 tablet (has no administration in time range)  atorvastatin (LIPITOR) tablet 10 mg (has no administration in time range)  ezetimibe (ZETIA) tablet 10 mg (has no administration in time range)  metoprolol succinate (TOPROL-XL) 24 hr tablet 50 mg (has no administration in time  range)  multivitamin with minerals tablet 1 tablet (has no administration in time range)  fluticasone (FLONASE) 50 MCG/ACT nasal spray 2 spray (  has no administration in time range)  loratadine (CLARITIN) tablet 10 mg (has no administration in time range)  0.9 %  sodium chloride infusion (has no administration in time range)  acetaminophen (TYLENOL) tablet 650 mg (has no administration in time range)    Or  acetaminophen (TYLENOL) suppository 650 mg (has no administration in time range)  traZODone (DESYREL) tablet 25 mg (has no administration in time range)  magnesium hydroxide (MILK OF MAGNESIA) suspension 30 mL (has no administration in time range)  ondansetron (ZOFRAN) tablet 4 mg (has no administration in time range)    Or  ondansetron (ZOFRAN) injection 4 mg (has no administration in time range)  morphine 2 MG/ML injection 2 mg (has no administration in time range)  ceFAZolin (ANCEF) IVPB 1 g/50 mL premix (has no administration in time range)  fentaNYL (SUBLIMAZE) injection 50 mcg (50 mcg Intravenous Given 07/03/20 2239)   ____________________________________________   FINAL CLINICAL IMPRESSION(S) / ED DIAGNOSES  Final diagnoses:  Fall, initial encounter  Hypokalemia  Hyperglycemia  Closed fracture of neck of left femur, initial encounter (HCC)  First degree atrioventricular block     ED Discharge Orders    None       Note:  This document was prepared using Dragon voice recognition software and may include unintentional dictation errors.   Gilles Chiquito, MD 07/03/20 2326    Gilles Chiquito, MD 07/03/20 [phone]

## 2020-07-03 NOTE — ED Notes (Addendum)
Pulse at L dorsalis pedis 2+; foot equal in warmth and color to R foot. L leg currently rotated inward and shorter than R as pt states too painful to try and extend leg fully. Pt can wiggle toes.

## 2020-07-03 NOTE — Progress Notes (Signed)
I was called by ED staff and have reviewed the patient's relevant imaging. Full consult note to follow tomorrow. I briefly discussed the radiographic findings and treatment options with the patient over the phone. She is in agreement to proceed with surgery tomorrow.   - Plan for L hip hemiarthroplasty tomorrow, likely afternoon.  - NPO after midnight - Hold anticoagulation - Admit to Hospitalist team.

## 2020-07-03 NOTE — ED Notes (Signed)
Pt leaving for imaging. Son at bedside.

## 2020-07-03 NOTE — ED Triage Notes (Signed)
Pt denies hitting head during fall. A&Ox4.

## 2020-07-03 NOTE — ED Triage Notes (Signed)
Pt in via EMS from home post fall with "pain at inside of groin". Pt tripped over container. 75 total of fentanyl given by EMS. Zofran 4 given by EMS. 169/90 BP; HR 100; 97% RA with EMS. HTN history per EMS. L hip/leg pain. 18g R ac placed by EMS.

## 2020-07-03 NOTE — H&P (Signed)
[place]   PATIENT NAME: Laura Deleon    MR#:  [id number]  DATE OF BIRTH:  Jun 17, 1946  DATE OF ADMISSION:  07/03/2020  PRIMARY CARE PHYSICIAN: Danelle Berry, PA-C   REQUESTING/REFERRING PHYSICIAN: Terrilee Files, MD  CHIEF COMPLAINT:   Chief Complaint  Patient presents with  . Fall    HISTORY OF PRESENT ILLNESS:  Laura Deleon  is a 74 y.o. Caucasian female with a known history of hypertension, dyslipidemia and osteopenia, who presented to the emergency room with concerns of accidental mechanical fall with subsequent left hip pain and inability to ambulate.  She was walking to her back door as she was locking the door hit her and she lost balance falling on the left side.  No head injuries.  No presyncope or syncope.  No chest pain or dyspnea or palpitations.  No recent cough or wheezing or dyspnea.  She has not been vaccinated for COVID-19.  No recent exposure to COVID-19.  She stated that she ate dark chocolate today when asked about mild hyperglycemia.  No dysuria, oliguria, urinary frequency or urgency or flank pain.  She has been having recent leg cramps.  Upon presentation to the emergency room, blood pressure was 175/86 with otherwise normal vital signs.  Labs revealed mild hypokalemia of 3.3 and blood glucose was 202 with otherwise normal CMP.  CBC showed leukocytosis of 12.5 with mild neutrophilia.  COVID-19 PCR is currently pending.  Portable chest x-ray showed no acute chest findings.  Left hip x-ray showed left femoral neck fracture with varus angulation.  The patient was given 50 mcg of IV fentanyl and 40 mill: P.o. potassium chloride as well as 1 p.o. Percocet.  She will be admitted to a medically monitored bed for further evaluation and management.  PAST MEDICAL HISTORY:   Past Medical History:  Diagnosis Date  . Allergy   . Hernia of anterior abdominal wall   . Hyperlipidemia   . Hypertension   . Osteopenia 09/08/2017   Oct 2018; next scan => Sep 10, 2019     PAST SURGICAL HISTORY:   Past Surgical History:  Procedure Laterality Date  . ABDOMINAL HYSTERECTOMY    . BLADDER SURGERY    . HAND SURGERY  11/14/2016   removal of foreign body  . HERNIA REPAIR     with mesh    SOCIAL HISTORY:   Social History   Tobacco Use  . Smoking status: Never Smoker  . Smokeless tobacco: Never Used  . Tobacco comment: smoking cessation materials not required  Substance Use Topics  . Alcohol use: No    Alcohol/week: 0.0 standard drinks    FAMILY HISTORY:   Family History  Problem Relation Age of Onset  . Hyperlipidemia Mother   . Hypertension Mother   . Hyperlipidemia Father   . Emphysema Father   . COPD Father   . Breast cancer Neg Hx     DRUG ALLERGIES:   Allergies  Allergen Reactions  . Sulfa Antibiotics Shortness Of Breath  . Latex     REVIEW OF SYSTEMS:   ROS As per history of present illness. All pertinent systems were reviewed above. Constitutional, HEENT, cardiovascular, respiratory, GI, GU, musculoskeletal, neuro, psychiatric, endocrine, integumentary and hematologic systems were reviewed and are otherwise negative/unremarkable except for positive findings mentioned above in the HPI.   MEDICATIONS AT HOME:   Prior to Admission medications   Medication Sig Start Date End Date Taking? Authorizing Provider  aspirin 81 MG EC tablet  Take 81 mg by mouth daily. Swallow whole.    [provider]  atorvastatin (LIPITOR) 10 MG tablet Take 10 mg PO every other day at bedtime 05/23/20   Danelle Berry, PA-C  ezetimibe (ZETIA) 10 MG tablet Take 1 tablet (10 mg total) by mouth daily. 11/15/19   Danelle Berry, PA-C  fluticasone (FLONASE) 50 MCG/ACT nasal spray Place 2 sprays into both nostrils as needed. 06/06/20   Danelle Berry, PA-C  loratadine (CLARITIN) 10 MG tablet Take 1 tablet (10 mg total) by mouth daily as needed for allergies. 05/23/20   Danelle Berry, PA-C  metoprolol succinate (TOPROL-XL) 50 MG 24 hr tablet TAKE 1 TABLET BY  MOUTH DAILY WITH OR IMMEDIATELY FOLLOWING A MEAL 05/23/20   Danelle Berry, PA-C  Multiple Vitamin (MULTI-VITAMINS) TABS Take 1 tablet by mouth daily. 02/01/09   [provider]      VITAL SIGNS:  Blood pressure (!) 175/86, pulse 86, temperature 98.5 F (36.9 C), temperature source Oral, height 5\' 5"  (1.651 m), weight 56.7 kg, SpO2 94 %.  PHYSICAL EXAMINATION:  Physical Exam  GENERAL:  74 y.o.-year-old Caucasian female patient lying in the bed with no acute distress.  EYES: Pupils equal, round, reactive to light and accommodation. No scleral icterus. Extraocular muscles intact.  HEENT: Head atraumatic, normocephalic. Oropharynx and nasopharynx clear.  NECK:  Supple, no jugular venous distention. No thyroid enlargement, no tenderness.  LUNGS: Normal breath sounds bilaterally, no wheezing, rales,rhonchi or crepitation. No use of accessory muscles of respiration.  CARDIOVASCULAR: Regular rate and rhythm, S1, S2 normal. No murmurs, rubs, or gallops.  ABDOMEN: Soft, nondistended, nontender. Bowel sounds present. No organomegaly or mass.  EXTREMITIES: No pedal edema, cyanosis, or clubbing.  NEUROLOGIC: Cranial nerves II through XII are intact. Muscle strength 5/5 in all extremities. Sensation intact. Gait not checked. Musculoskeletal: Left hip tenderness. PSYCHIATRIC: The patient is alert and oriented x 3.  Normal affect and good eye contact. SKIN: No obvious rash, lesion, or ulcer.   LABORATORY PANEL:   CBC Recent Labs  Lab 07/03/20 2220  WBC 12.5*  HGB 14.4  HCT 43.6  PLT 219   ------------------------------------------------------------------------------------------------------------------  Chemistries  Recent Labs  Lab 07/03/20 2220  NA 136  K 3.3*  CL 99  CO2 27  GLUCOSE 202*  BUN 12  CREATININE 0.54  CALCIUM 9.0  AST 30  ALT 21  ALKPHOS 56  BILITOT 0.5    ------------------------------------------------------------------------------------------------------------------  Cardiac Enzymes No results for input(s): TROPONINI in the last 168 hours. ------------------------------------------------------------------------------------------------------------------  RADIOLOGY:  DG Chest Port 1 View  Result Date: 07/03/2020 CLINICAL DATA:  Left hip fracture.  Post fall. EXAM: PORTABLE CHEST 1 VIEW COMPARISON:  Chest radiograph 08/17/2018 FINDINGS: The cardiomediastinal contours are normal. Mild aortic atherosclerosis. Pulmonary vasculature is normal. No consolidation, pleural effusion, or pneumothorax. No acute osseous abnormalities are seen. IMPRESSION: No acute chest findings. Electronically Signed   By: 10/17/2018 M.D.   On: 07/03/2020 23:07   DG Hip Unilat W or Wo Pelvis 2-3 Views Left  Result Date: 07/03/2020 CLINICAL DATA:  Fall, left hip pain EXAM: DG HIP (WITH OR WITHOUT PELVIS) 2-3V LEFT COMPARISON:  None. FINDINGS: There is a left femoral neck fracture with varus angulation. No subluxation or dislocation. SI joints symmetric. IMPRESSION: Left femoral neck fracture with varus angulation. Electronically Signed   By: 07/05/2020 M.D.   On: 07/03/2020 23:06      IMPRESSION AND PLAN:   1.  Left hip fracture secondary to mechanical fall. -The  patient will be admitted to a medical monitored bed. -Pain management will be provided. -Orthopedic consultation will be obtained. -Dr. Signa Kell was notified about the patient and is aware. -The patient will be n.p.o. after midnight planning for operative procedure by noon tomorrow. -Aspirin will be held off. -The patient has no history of coronary artery disease, CHF, diabetes mellitus on insulin, renal failure with creatinine of more than 2 or CVA.  She is therefore considered at average risk for her age for perioperative cardiovascular events.  She has no current pulmonary issues.  2.   Hypokalemia. -Potassium will be replaced and magnesium level will be checked. -This may explain recent leg cramps she has been having.  3.  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 -We will monitor blood glucose levels and check hemoglobin A1c. -The patient will be placed on supplement coverage with NovoLog. -This could be related to the new onset type 2 diabetes mellitus.  4.  Dyslipidemia. -Statin therapy will be resumed as well as Zetia.  5.  Essential hypertension, mildly uncontrolled due to pain. -Toprol-XL will be continued and that should provide perioperative cardiovascular protection. -As needed IV labetalol will be provided with adequate pain management.  6.  DVT prophylaxis. -SCDs. -Medical prophylaxis will be held off for postoperative period.   All the records are reviewed and case discussed with ED provider. The plan of care was discussed in details with the patient (and family). I answered all questions. The patient agreed to proceed with the above mentioned plan. Further management will depend upon hospital course.   CODE STATUS: Full code  Status is: Inpatient  Remains inpatient appropriate because:Ongoing active pain requiring inpatient pain management, Ongoing diagnostic testing needed not appropriate for outpatient work up, Unsafe d/c plan, IV treatments appropriate due to intensity of illness or inability to take PO and Inpatient level of care appropriate due to severity of illness   Dispo: The patient is from: Home              Anticipated d/c is to: SNF              Anticipated d/c date is: 3 days              Patient currently is not medically stable to d/c.   TOTAL TIME TAKING CARE OF THIS PATIENT: 55 minutes.    Hannah Beat M.D on 07/03/2020 at 11:26 PM  [facility]   From 7 PM-7 AM, contact night-coverage [email or web address]  CC: Primary care physician; Danelle Berry, PA-C   Note: This dictation was prepared with  Dragon dictation along with smaller phrase technology. Any transcriptional typo errors that result from this process are unintentional.

## 2020-07-04 ENCOUNTER — Encounter
Admission: EM | Disposition: A | Discharge status: Skilled Nursing Facility | Payer: Self-pay | Source: Home / Self Care | Attending: Student

## 2020-07-04 ENCOUNTER — Inpatient Hospital Stay: Payer: PPO | Admitting: Anesthesiology

## 2020-07-04 ENCOUNTER — Inpatient Hospital Stay: Payer: PPO

## 2020-07-04 ENCOUNTER — Encounter: Payer: Self-pay | Admitting: Family Medicine

## 2020-07-04 DIAGNOSIS — I10 Essential (primary) hypertension: Secondary | ICD-10-CM

## 2020-07-04 DIAGNOSIS — E871 Hypo-osmolality and hyponatremia: Secondary | ICD-10-CM

## 2020-07-04 DIAGNOSIS — D72829 Elevated white blood cell count, unspecified: Secondary | ICD-10-CM

## 2020-07-04 HISTORY — PX: HIP ARTHROPLASTY: SHX981

## 2020-07-04 LAB — GLUCOSE, CAPILLARY
Glucose-Capillary: 131 mg/dL — ABNORMAL HIGH (ref 70–99)
Glucose-Capillary: 103 mg/dL — ABNORMAL HIGH (ref 70–99)
Glucose-Capillary: 101 mg/dL — ABNORMAL HIGH (ref 70–99)
Glucose-Capillary: 141 mg/dL — ABNORMAL HIGH (ref 70–99)
Glucose-Capillary: 169 mg/dL — ABNORMAL HIGH (ref 70–99)

## 2020-07-04 LAB — BASIC METABOLIC PANEL
Anion gap: 7 (ref 5–15)
BUN: 9 mg/dL (ref 8–23)
CO2: 28 mmol/L (ref 22–32)
Calcium: 8.7 mg/dL — ABNORMAL LOW (ref 8.9–10.3)
Chloride: 98 mmol/L (ref 98–111)
Creatinine, Ser: 0.65 mg/dL (ref 0.44–1.00)
GFR calc Af Amer: >60 mL/min (ref >60)
GFR calc non Af Amer: >60 mL/min (ref >60)
Glucose, Bld: 142 mg/dL — ABNORMAL HIGH (ref 70–99)
Potassium: 4.5 mmol/L (ref 3.5–5.1)
Sodium: 133 mmol/L — ABNORMAL LOW (ref 135–145)

## 2020-07-04 LAB — TYPE AND SCREEN
ABO/RH(D): A POS
Antibody Screen: NEGATIVE

## 2020-07-04 LAB — CBC
HCT: 40.5 % (ref 36.0–46.0)
Hemoglobin: 13.6 g/dL (ref 12.0–15.0)
MCH: 29.2 pg (ref 26.0–34.0)
MCHC: 33.6 g/dL (ref 30.0–36.0)
MCV: 86.9 fL (ref 80.0–100.0)
Platelets: 195 10*3/uL (ref 150–400)
RBC: 4.66 MIL/uL (ref 3.87–5.11)
RDW: 13.2 % (ref 11.5–15.5)
WBC: 14.2 10*3/uL — ABNORMAL HIGH (ref 4.0–10.5)
nRBC: 0 % (ref 0.0–0.2)

## 2020-07-04 LAB — HEMOGLOBIN A1C
Hgb A1c MFr Bld: 5.8 % — ABNORMAL HIGH (ref 4.8–5.6)
Mean Plasma Glucose: 119.76 mg/dL

## 2020-07-04 SURGERY — HEMIARTHROPLASTY, HIP, DIRECT ANTERIOR APPROACH, FOR FRACTURE
Anesthesia: Spinal | Site: Hip | Laterality: Left

## 2020-07-04 MED ORDER — PHENOL 1.4 % MT LIQD
1.0000 | OROMUCOSAL | Status: DC | PRN
  Filled 2020-07-04: qty 177

## 2020-07-04 MED ORDER — FENTANYL CITRATE (PF) 100 MCG/2ML IJ SOLN: 25.0000 ug | INTRAMUSCULAR | Status: DC | PRN

## 2020-07-04 MED ORDER — BUPIVACAINE LIPOSOME 1.3 % IJ SUSP
INTRAMUSCULAR | Status: DC | PRN
  Administered 2020-07-04: 50 mL

## 2020-07-04 MED ORDER — INSULIN ASPART 100 UNIT/ML ~~LOC~~ SOLN
0.0000 [IU] | Freq: Three times a day (TID) | SUBCUTANEOUS | Status: DC
  Administered 2020-07-04: 3 [IU] via SUBCUTANEOUS
  Administered 2020-07-05 – 2020-07-06 (×6): 1 [IU] via SUBCUTANEOUS
  Administered 2020-07-07: 2 [IU] via SUBCUTANEOUS
  Administered 2020-07-08: 1 [IU] via SUBCUTANEOUS
  Filled 2020-07-04 (×9): qty 1

## 2020-07-04 MED ORDER — PROPOFOL 500 MG/50ML IV EMUL
INTRAVENOUS | Status: DC | PRN
  Administered 2020-07-04: 30 ug/kg/min via INTRAVENOUS

## 2020-07-04 MED ORDER — ACETAMINOPHEN 500 MG PO TABS
1000.0000 mg | ORAL_TABLET | Freq: Three times a day (TID) | ORAL | Status: AC
  Administered 2020-07-04 – 2020-07-05 (×2): 1000 mg via ORAL
  Filled 2020-07-04 (×4): qty 2

## 2020-07-04 MED ORDER — FENTANYL CITRATE (PF) 100 MCG/2ML IJ SOLN
INTRAMUSCULAR | Status: AC
  Administered 2020-07-04: 25 ug via INTRAVENOUS
  Filled 2020-07-04: qty 2

## 2020-07-04 MED ORDER — CEFAZOLIN SODIUM-DEXTROSE 1-4 GM/50ML-% IV SOLN
INTRAVENOUS | Status: AC
  Filled 2020-07-04: qty 50

## 2020-07-04 MED ORDER — BISACODYL 10 MG RE SUPP
10.0000 mg | Freq: Every day | RECTAL | Status: DC | PRN
  Filled 2020-07-04: qty 1

## 2020-07-04 MED ORDER — MIDAZOLAM HCL 5 MG/5ML IJ SOLN
INTRAMUSCULAR | Status: DC | PRN
  Administered 2020-07-04: 2 mg via INTRAVENOUS

## 2020-07-04 MED ORDER — ENOXAPARIN SODIUM 40 MG/0.4ML ~~LOC~~ SOLN
40.0000 mg | SUBCUTANEOUS | Status: DC
  Administered 2020-07-05 – 2020-07-09 (×5): 40 mg via SUBCUTANEOUS
  Filled 2020-07-04 (×5): qty 0.4

## 2020-07-04 MED ORDER — SODIUM CHLORIDE 0.9 % IR SOLN
Status: DC | PRN
  Administered 2020-07-04: 500 mL

## 2020-07-04 MED ORDER — GLYCOPYRROLATE 0.2 MG/ML IJ SOLN
INTRAMUSCULAR | Status: DC | PRN
  Administered 2020-07-04: .2 mg via INTRAVENOUS

## 2020-07-04 MED ORDER — DEXAMETHASONE SODIUM PHOSPHATE 10 MG/ML IJ SOLN
INTRAMUSCULAR | Status: DC | PRN
  Administered 2020-07-04: 5 mg via INTRAVENOUS

## 2020-07-04 MED ORDER — HYDROMORPHONE HCL 1 MG/ML IJ SOLN: 0.2000 mg | INTRAMUSCULAR | Status: DC | PRN

## 2020-07-04 MED ORDER — PROPOFOL 500 MG/50ML IV EMUL
INTRAVENOUS | Status: AC
  Filled 2020-07-04: qty 50

## 2020-07-04 MED ORDER — ONDANSETRON HCL 4 MG PO TABS: 4.0000 mg | ORAL_TABLET | Freq: Four times a day (QID) | ORAL | Status: DC | PRN

## 2020-07-04 MED ORDER — FENTANYL CITRATE (PF) 100 MCG/2ML IJ SOLN
INTRAMUSCULAR | Status: DC | PRN
  Administered 2020-07-04 (×2): 50 ug via INTRAVENOUS

## 2020-07-04 MED ORDER — METOCLOPRAMIDE HCL 5 MG/ML IJ SOLN: 5.0000 mg | Freq: Three times a day (TID) | INTRAMUSCULAR | Status: DC | PRN

## 2020-07-04 MED ORDER — BUPIVACAINE LIPOSOME 1.3 % IJ SUSP
INTRAMUSCULAR | Status: AC
  Filled 2020-07-04: qty 20

## 2020-07-04 MED ORDER — PHENYLEPHRINE HCL (PRESSORS) 10 MG/ML IV SOLN
INTRAVENOUS | Status: DC | PRN
  Administered 2020-07-04: 100 ug via INTRAVENOUS
  Administered 2020-07-04: 200 ug via INTRAVENOUS
  Administered 2020-07-04 (×2): 100 ug via INTRAVENOUS

## 2020-07-04 MED ORDER — TRANEXAMIC ACID-NACL 1000-0.7 MG/100ML-% IV SOLN
INTRAVENOUS | Status: AC
  Administered 2020-07-04: 1000 mg via INTRAVENOUS
  Filled 2020-07-04: qty 100

## 2020-07-04 MED ORDER — SODIUM CHLORIDE 0.9 % IV SOLN
INTRAVENOUS | Status: DC | PRN
  Administered 2020-07-04: 50 ug/min via INTRAVENOUS

## 2020-07-04 MED ORDER — LABETALOL HCL 5 MG/ML IV SOLN: 20.0000 mg | INTRAVENOUS | Status: DC | PRN

## 2020-07-04 MED ORDER — DOCUSATE SODIUM 100 MG PO CAPS
100.0000 mg | ORAL_CAPSULE | Freq: Two times a day (BID) | ORAL | Status: DC
  Administered 2020-07-04 – 2020-07-08 (×8): 100 mg via ORAL
  Filled 2020-07-04 (×10): qty 1

## 2020-07-04 MED ORDER — CHLORHEXIDINE GLUCONATE 0.12 % MT SOLN
OROMUCOSAL | Status: AC
  Filled 2020-07-04: qty 15

## 2020-07-04 MED ORDER — MIDAZOLAM HCL 2 MG/2ML IJ SOLN
INTRAMUSCULAR | Status: AC
  Filled 2020-07-04: qty 2

## 2020-07-04 MED ORDER — METOCLOPRAMIDE HCL 10 MG PO TABS: 5.0000 mg | ORAL_TABLET | Freq: Three times a day (TID) | ORAL | Status: DC | PRN

## 2020-07-04 MED ORDER — OXYCODONE-ACETAMINOPHEN 5-325 MG PO TABS
1.0000 | ORAL_TABLET | ORAL | Status: DC | PRN
  Administered 2020-07-04 (×2): 1 via ORAL
  Filled 2020-07-04 (×2): qty 1

## 2020-07-04 MED ORDER — ONDANSETRON HCL 4 MG/2ML IJ SOLN
4.0000 mg | Freq: Four times a day (QID) | INTRAMUSCULAR | Status: DC | PRN
  Administered 2020-07-05: 4 mg via INTRAVENOUS
  Filled 2020-07-04: qty 2

## 2020-07-04 MED ORDER — BUPIVACAINE HCL (PF) 0.5 % IJ SOLN
INTRAMUSCULAR | Status: DC | PRN
  Administered 2020-07-04: 15 mL via INTRATHECAL

## 2020-07-04 MED ORDER — CHLORHEXIDINE GLUCONATE CLOTH 2 % EX PADS
6.0000 | MEDICATED_PAD | Freq: Every day | CUTANEOUS | Status: DC
  Administered 2020-07-04: 6 via TOPICAL

## 2020-07-04 MED ORDER — ASPIRIN 81 MG PO CHEW
81.0000 mg | CHEWABLE_TABLET | Freq: Every day | ORAL | Status: DC
  Administered 2020-07-04 – 2020-07-09 (×6): 81 mg via ORAL
  Filled 2020-07-04 (×6): qty 1

## 2020-07-04 MED ORDER — TRAMADOL HCL 50 MG PO TABS
50.0000 mg | ORAL_TABLET | Freq: Four times a day (QID) | ORAL | Status: DC | PRN
  Administered 2020-07-05 – 2020-07-06 (×3): 50 mg via ORAL
  Filled 2020-07-04 (×4): qty 1

## 2020-07-04 MED ORDER — BUPIVACAINE HCL (PF) 0.5 % IJ SOLN
INTRAMUSCULAR | Status: AC
  Filled 2020-07-04: qty 30

## 2020-07-04 MED ORDER — TRANEXAMIC ACID 1000 MG/10ML IV SOLN
INTRAVENOUS | Status: AC
  Filled 2020-07-04: qty 10

## 2020-07-04 MED ORDER — TRANEXAMIC ACID-NACL 1000-0.7 MG/100ML-% IV SOLN
INTRAVENOUS | Status: DC | PRN
  Administered 2020-07-04: 1000 mg via INTRAVENOUS

## 2020-07-04 MED ORDER — ACETAMINOPHEN 10 MG/ML IV SOLN
INTRAVENOUS | Status: DC | PRN
  Administered 2020-07-04: 1000 mg via INTRAVENOUS

## 2020-07-04 MED ORDER — SENNOSIDES-DOCUSATE SODIUM 8.6-50 MG PO TABS: 1.0000 | ORAL_TABLET | Freq: Every evening | ORAL | Status: DC | PRN

## 2020-07-04 MED ORDER — SODIUM CHLORIDE 0.9 % IV SOLN: INTRAVENOUS | Status: DC

## 2020-07-04 MED ORDER — OXYCODONE HCL 5 MG PO TABS
5.0000 mg | ORAL_TABLET | ORAL | Status: DC | PRN
  Administered 2020-07-07: 5 mg via ORAL
  Administered 2020-07-07: 10 mg via ORAL
  Administered 2020-07-09: 5 mg via ORAL
  Filled 2020-07-04 (×2): qty 2

## 2020-07-04 MED ORDER — ACETAMINOPHEN 10 MG/ML IV SOLN
INTRAVENOUS | Status: AC
  Filled 2020-07-04: qty 100

## 2020-07-04 MED ORDER — NEOMYCIN-POLYMYXIN B GU 40-200000 IR SOLN
Status: AC
  Filled 2020-07-04: qty 20

## 2020-07-04 MED ORDER — FENTANYL CITRATE (PF) 100 MCG/2ML IJ SOLN
INTRAMUSCULAR | Status: AC
  Filled 2020-07-04: qty 2

## 2020-07-04 MED ORDER — TRANEXAMIC ACID-NACL 1000-0.7 MG/100ML-% IV SOLN: 1000.0000 mg | Freq: Once | INTRAVENOUS | Status: AC

## 2020-07-04 MED ORDER — MENTHOL 3 MG MT LOZG
1.0000 | LOZENGE | OROMUCOSAL | Status: DC | PRN
  Filled 2020-07-04: qty 9

## 2020-07-04 MED ORDER — CEFAZOLIN SODIUM-DEXTROSE 1-4 GM/50ML-% IV SOLN
1.0000 g | Freq: Four times a day (QID) | INTRAVENOUS | Status: AC
  Administered 2020-07-04 – 2020-07-05 (×2): 1 g via INTRAVENOUS
  Filled 2020-07-04 (×2): qty 50

## 2020-07-04 MED ORDER — LACTATED RINGERS IV SOLN: INTRAVENOUS | Status: DC | PRN

## 2020-07-04 MED ORDER — OXYCODONE HCL 5 MG PO TABS
2.5000 mg | ORAL_TABLET | ORAL | Status: DC | PRN
  Administered 2020-07-05 – 2020-07-08 (×6): 5 mg via ORAL
  Filled 2020-07-04 (×8): qty 1

## 2020-07-04 SURGICAL SUPPLY — 74 items
BLADE SAW SGTL 13X75X1.27 (BLADE) ×3 IMPLANT
BLADE SURG SZ10 CARB STEEL (BLADE) ×3 IMPLANT
BNDG COHESIVE 4X5 TAN STRL (GAUZE/BANDAGES/DRESSINGS) ×3 IMPLANT
CANISTER SUCT 1200ML W/VALVE (MISCELLANEOUS) ×3 IMPLANT
CANISTER SUCT 3000ML PPV (MISCELLANEOUS) ×6 IMPLANT
CHLORAPREP W/TINT 26 (MISCELLANEOUS) ×5 IMPLANT
COVER BACK TABLE REUSABLE LG (DRAPES) ×3 IMPLANT
COVER MAYO STAND STRL (DRAPES) ×3 IMPLANT
COVER WAND RF STERILE (DRAPES) ×3 IMPLANT
DERMABOND ADVANCED (GAUZE/BANDAGES/DRESSINGS) ×2
DERMABOND ADVANCED .7 DNX12 (GAUZE/BANDAGES/DRESSINGS) ×1 IMPLANT
DRAPE 3/4 80X56 (DRAPES) ×9 IMPLANT
DRAPE INCISE IOBAN 66X60 STRL (DRAPES) ×3 IMPLANT
DRAPE SPLIT 6X30 W/TAPE (DRAPES) ×3 IMPLANT
DRAPE SURG 17X11 SM STRL (DRAPES) ×3 IMPLANT
DRAPE U-SHAPE 47X51 STRL (DRAPES) ×3 IMPLANT
DRSG OPSITE POSTOP 4X10 (GAUZE/BANDAGES/DRESSINGS) ×3 IMPLANT
DRSG OPSITE POSTOP 4X8 (GAUZE/BANDAGES/DRESSINGS) ×3 IMPLANT
ELECT BLADE 6.5 EXT (BLADE) ×3 IMPLANT
ELECT CAUTERY BLADE 6.4 (BLADE) ×3 IMPLANT
ELECT REM PT RETURN 9FT ADLT (ELECTROSURGICAL) ×3
ELECTRODE REM PT RTRN 9FT ADLT (ELECTROSURGICAL) ×1 IMPLANT
GAUZE SPONGE 4X4 12PLY STRL (GAUZE/BANDAGES/DRESSINGS) ×1 IMPLANT
GAUZE XEROFORM 1X8 LF (GAUZE/BANDAGES/DRESSINGS) ×3 IMPLANT
GLOVE BIOGEL PI IND STRL 8 (GLOVE) ×2 IMPLANT
GLOVE BIOGEL PI INDICATOR 8 (GLOVE) ×4
GLOVE SURG ORTHO 8.0 STRL STRW (GLOVE) ×6 IMPLANT
GOWN STRL REUS W/ TWL LRG LVL3 (GOWN DISPOSABLE) ×1 IMPLANT
GOWN STRL REUS W/ TWL XL LVL3 (GOWN DISPOSABLE) ×1 IMPLANT
GOWN STRL REUS W/TWL LRG LVL3 (GOWN DISPOSABLE) ×4
GOWN STRL REUS W/TWL XL LVL3 (GOWN DISPOSABLE) ×2
HEAD MODULAR ENDO (Orthopedic Implant) ×2 IMPLANT
HEAD UNPLR 46XMDLR STRL HIP (Orthopedic Implant) IMPLANT
HEMOVAC 400ML (MISCELLANEOUS)
KIT DRAIN HEMOVAC JP 7FR 400ML (MISCELLANEOUS) IMPLANT
KIT TURNOVER KIT A (KITS) ×3 IMPLANT
NDL FILTER BLUNT 18X1 1/2 (NEEDLE) ×1 IMPLANT
NDL MAYO CATGUT SZ4 TPR NDL (NEEDLE) ×1 IMPLANT
NDL SAFETY ECLIPSE 18X1.5 (NEEDLE) ×1 IMPLANT
NDL SPNL 20GX3.5 QUINCKE YW (NEEDLE) IMPLANT
NEEDLE FILTER BLUNT 18X 1/2SAF (NEEDLE) ×2
NEEDLE FILTER BLUNT 18X1 1/2 (NEEDLE) ×1 IMPLANT
NEEDLE HYPO 18GX1.5 SHARP (NEEDLE) ×2
NEEDLE HYPO 22GX1.5 SAFETY (NEEDLE) ×3 IMPLANT
NEEDLE MAYO CATGUT SZ4 (NEEDLE) ×3 IMPLANT
NEEDLE SPNL 20GX3.5 QUINCKE YW (NEEDLE) ×3 IMPLANT
NEPTUNE MANIFOLD (MISCELLANEOUS) ×3 IMPLANT
NS IRRIG 1000ML POUR BTL (IV SOLUTION) ×3 IMPLANT
PACK HIP PROSTHESIS (MISCELLANEOUS) ×3 IMPLANT
PENCIL SMOKE ULTRAEVAC 22 CON (MISCELLANEOUS) ×3 IMPLANT
PILLOW ABDUCTION FOAM SM (MISCELLANEOUS) ×3 IMPLANT
PULSAVAC PLUS IRRIG FAN TIP (DISPOSABLE) ×3
RETRIEVER SUT HEWSON (MISCELLANEOUS) IMPLANT
SLEEVE UNITRAX V40 (Orthopedic Implant) ×2 IMPLANT
SLEEVE UNITRAX V40 +8 (Orthopedic Implant) IMPLANT
SOL .9 NS 3000ML IRR  AL (IV SOLUTION) ×2
SOL .9 NS 3000ML IRR UROMATIC (IV SOLUTION) ×1 IMPLANT
STAPLER SKIN PROX 35W (STAPLE) ×3 IMPLANT
STEM FEM ACCOLADE 38X102X30 S3 (Stem) ×2 IMPLANT
SUT ETHIBOND #5 BRAIDED 30INL (SUTURE) ×3 IMPLANT
SUT MNCRL 4-0 (SUTURE) ×2
SUT MNCRL 4-0 27XMFL (SUTURE) ×1
SUT VIC AB 0 CT1 36 (SUTURE) ×5 IMPLANT
SUT VIC AB 2-0 CT2 27 (SUTURE) ×6 IMPLANT
SUTURE MNCRL 4-0 27XMF (SUTURE) ×1 IMPLANT
SYR 20ML LL LF (SYRINGE) ×3 IMPLANT
SYR 50ML LL SCALE MARK (SYRINGE) ×3 IMPLANT
TAPE MICROFOAM 4IN (TAPE) ×1 IMPLANT
TAPE TRANSPORE STRL 2 31045 (GAUZE/BANDAGES/DRESSINGS) ×1 IMPLANT
TIP BRUSH PULSAVAC PLUS 24.33 (MISCELLANEOUS) ×3 IMPLANT
TIP FAN IRRIG PULSAVAC PLUS (DISPOSABLE) ×1 IMPLANT
TOWEL OR 17X26 4PK STRL BLUE (TOWEL DISPOSABLE) ×2 IMPLANT
TUBE KAMVAC SUCTION (TUBING) ×3 IMPLANT
TUBE SUCT KAM VAC (TUBING) ×3 IMPLANT

## 2020-07-04 NOTE — Progress Notes (Signed)
  Chaplain On-Call responded to Order Requisition for discussion of Health Care Power of [place].  Chaplain learned from Unit Secretary Darl Pikes that the patient has just been taken for surgery. Darl Pikes suggested an attempt to visit later today, or on tomorrow.  Chaplain will refer to the next On-Call Chaplain.  Chaplain Mia Milan B. Aydin Cavalieri M.Div., [facility]

## 2020-07-04 NOTE — Consult Note (Signed)
ORTHOPAEDIC CONSULTATION  REQUESTING PHYSICIAN: Almon Hercules, MD  Chief Complaint:   L hip pain  History of Present Illness: Laura Deleon is a 74 y.o. female who had a fall yesterday.  The patient noted immediate hip pain and inability to ambulate.  The patient ambulates unassisted at baseline. She lives at home with a grandchild.  Pain is described as sharp at its worst and a dull ache at its best.  Pain is rated a 10 out of 10 in severity.  Pain is improved with rest and immobilization.  Pain is worse with any sort of movement.  X-rays in the emergency department show a left displaced femoral neck fracture.  Of note, the patient states she has had a chronic abdominal hernia.  She feels that this may have worsened with her recent fall.  Past Medical History:  Diagnosis Date  . Allergy   . Hernia of anterior abdominal wall   . Hyperlipidemia   . Hypertension   . Osteopenia 09/08/2017   Oct 2018; next scan => Sep 10, 2019   Past Surgical History:  Procedure Laterality Date  . ABDOMINAL HYSTERECTOMY    . BLADDER SURGERY    . HAND SURGERY  11/14/2016   removal of foreign body  . HERNIA REPAIR     with mesh   Social History   Socioeconomic History  . Marital status: Widowed    Spouse name: Faylene Million  . Number of children: 2  . Years of education: Not on file  . Highest education level: 12th grade  Occupational History  . Occupation: Retired  Tobacco Use  . Smoking status: Never Smoker  . Smokeless tobacco: Never Used  . Tobacco comment: smoking cessation materials not required  Vaping Use  . Vaping Use: Never used  Substance and Sexual Activity  . Alcohol use: No    Alcohol/week: 0.0 standard drinks  . Drug use: No  . Sexual activity: Not Currently  Other Topics Concern  . Not on file  Social History Narrative   Husband passed away 03/07/2016, she is raising her 68 year old great grandson   Social Determinants  of Corporate investment banker Strain: Low Risk   . Difficulty of Paying Living Expenses: Not very hard  Food Insecurity: No Food Insecurity  . Worried About Programme researcher, broadcasting/film/video in the Last Year: Never true  . Ran Out of Food in the Last Year: Never true  Transportation Needs: No Transportation Needs  . Lack of Transportation (Medical): No  . Lack of Transportation (Non-Medical): No  Physical Activity: Inactive  . Days of Exercise per Week: 0 days  . Minutes of Exercise per Session: 0 min  Stress: Stress Concern Present  . Feeling of Stress : To some extent  Social Connections: Moderately Isolated  . Frequency of Communication with Friends and Family: More than three times a week  . Frequency of Social Gatherings with Friends and Family: Three times a week  . Attends Religious Services: More than 4 times per year  . Active Member of Clubs or Organizations: No  . Attends Banker Meetings: Never  . Marital Status: Widowed   Family History  Problem Relation Age of Onset  . Hyperlipidemia Mother   . Hypertension Mother   . Hyperlipidemia Father   . Emphysema Father   . COPD Father   . Breast cancer Neg Hx    Allergies  Allergen Reactions  . Sulfa Antibiotics Shortness Of Breath  . Latex  Prior to Admission medications   Medication Sig Start Date End Date Taking? Authorizing Provider  ezetimibe (ZETIA) 10 MG tablet Take 1 tablet (10 mg total) by mouth daily. 11/15/19  Yes Danelle Berry, PA-C  metoprolol succinate (TOPROL-XL) 50 MG 24 hr tablet TAKE 1 TABLET BY MOUTH DAILY WITH OR IMMEDIATELY FOLLOWING A MEAL Patient taking differently: Take 50 mg by mouth daily.  05/23/20  Yes Danelle Berry, PA-C  aspirin 81 MG EC tablet Take 81 mg by mouth daily. Swallow whole.    [provider]  atorvastatin (LIPITOR) 10 MG tablet Take 10 mg PO every other day at bedtime 05/23/20   Danelle Berry, PA-C  fluticasone (FLONASE) 50 MCG/ACT nasal spray Place 2 sprays into both  nostrils as needed. 06/06/20   Danelle Berry, PA-C  loratadine (CLARITIN) 10 MG tablet Take 1 tablet (10 mg total) by mouth daily as needed for allergies. 05/23/20   Danelle Berry, PA-C  Multiple Vitamin (MULTI-VITAMINS) TABS Take 1 tablet by mouth daily. 02/01/09   [provider]   Recent Labs    07/03/20 2220 07/04/20 0459  WBC 12.5* 14.2*  HGB 14.4 13.6  HCT 43.6 40.5  PLT 219 195  K 3.3* 4.5  CL 99 98  CO2 27 28  BUN 12 9  CREATININE 0.54 0.65  GLUCOSE 202* 142*  CALCIUM 9.0 8.7*  INR 1.0  --    DG Chest Port 1 View  Result Date: 07/03/2020 CLINICAL DATA:  Left hip fracture.  Post fall. EXAM: PORTABLE CHEST 1 VIEW COMPARISON:  Chest radiograph 08/17/2018 FINDINGS: The cardiomediastinal contours are normal. Mild aortic atherosclerosis. Pulmonary vasculature is normal. No consolidation, pleural effusion, or pneumothorax. No acute osseous abnormalities are seen. IMPRESSION: No acute chest findings. Electronically Signed   By: Narda Rutherford M.D.   On: 07/03/2020 23:07   DG Hip Unilat W or Wo Pelvis 2-3 Views Left  Result Date: 07/03/2020 CLINICAL DATA:  Fall, left hip pain EXAM: DG HIP (WITH OR WITHOUT PELVIS) 2-3V LEFT COMPARISON:  None. FINDINGS: There is a left femoral neck fracture with varus angulation. No subluxation or dislocation. SI joints symmetric. IMPRESSION: Left femoral neck fracture with varus angulation. Electronically Signed   By: Charlett Nose M.D.   On: 07/03/2020 23:06     Positive ROS: All other systems have been reviewed and were otherwise negative with the exception of those mentioned in the HPI and as above.  Physical Exam: BP 140/76   Pulse 86   Temp 100 F (37.8 C)   Resp 17   Ht 5\' 5"  (1.651 m)   Wt 56.7 kg   SpO2 91%   BMI 20.80 kg/m  General:  Alert, no acute distress Psychiatric:  Patient is competent for consent with normal mood and affect   Cardiovascular:  No pedal edema, regular rate and rhythm Respiratory:  No wheezing,  non-labored breathing GI:  Abdomen is soft and non-tender, there is a large hernia in the LLQ Skin:  No lesions in the area of chief complaint, no erythema Neurologic:  Sensation intact distally, CN grossly intact Lymphatic:  No axillary or cervical lymphadenopathy  Orthopedic Exam:  LLE: + DF/PF/EHL SILT grossly over foot.  There is some mild diffuse numbness about the foot that is chronic and also present on the contralateral foot Foot wwp +Log roll/axial load   X-rays:  As above: L displaced femoral neck fracture  Assessment/Plan: Laura Deleon is a 74 y.o. female with a L displaced femoral neck fracture  1. I discussed the various treatment options including both surgical and non-surgical management of her fracture with the patient's daughter (medical PoA). We discussed the high risk of perioperative complications due to patient's age, dementia, and other co-morbidities. After discussion of risks, benefits, and alternatives to surgery, the family and patient were in agreement to proceed with surgery. The goals of surgery would be to provide adequate pain relief and allow for mobilization. Plan for surgery is left hip hemiarthroplasty today, 07/04/2020. 2. NPO until OR 3. Hold anticoagulation in advance of OR 4.  We will plan to discuss hernia with primary team    Signa Kell   07/04/2020 12:38 PM

## 2020-07-04 NOTE — Anesthesia Procedure Notes (Signed)
Spinal  Patient location during procedure: OR Start time: 07/04/2020 1:15 PM End time: 07/04/2020 1:35 PM Staffing Performed: anesthesiologist and resident/CRNA  Anesthesiologist: Martha Clan, MD Resident/CRNA: Louann Sjogren, CRNA Preanesthetic Checklist Completed: patient identified, IV checked, site marked, risks and benefits discussed, surgical consent, monitors and equipment checked, pre-op evaluation and timeout performed Spinal Block Patient position: sitting Prep: ChloraPrep Patient monitoring: heart rate, continuous pulse ox, blood pressure and cardiac monitor Approach: midline Location: L3-4 Injection technique: single-shot Needle Needle type: Whitacre and Introducer  Needle gauge: 24 G Needle length: 9 cm Assessment Sensory level: T10 Additional Notes Negative paresthesia. Negative blood return. Positive free-flowing CSF. Expiration date of kit checked and confirmed. Patient tolerated procedure well, without complications.

## 2020-07-04 NOTE — Op Note (Signed)
DATE OF SURGERY: 07/04/2020  PREOPERATIVE DIAGNOSIS: Left femoral neck fracture  POSTOPERATIVE DIAGNOSIS: Left femoral neck fracture  PROCEDURE: Left hip hemiarthroplasty  SURGEON: Rosealee Albee, MD  ANESTHESIA: spinal  EBL: 300 cc  COMPONENTS:  Stryker - Accolade II Size 3 Stem Stryker - Unitrax 37mm head with +8 offset neck   INDICATIONS: Laura Deleon is a 74 y.o. female who sustained a displaced femoral neck fracture after a fall. Risks and benefits of hip hemiarthroplasty were explained to the patient and/or family. Risks include but are not limited to bleeding, infection, injury to tissues, nerves, vessels, periprosthetic infection, dislocation, limb length discrepancy and risks of anesthesia. The patient and/or family understands these risks, has completed an informed consent and wishes to proceed.   PROCEDURE:  The patient was identified in the preoperative holding area and the operative extremity was marked.  The patient was then transferred to the operating room suite and mobilized from the hospital gurney to the operating room table. Anesthesia was administered without complication. The patient was then transitioned to a lateral position.  All bony prominences were padded per protocol.  An axillary roll was placed.  Careful attention was paid to the contralateral side peroneal nerve, which was free from pressure with use of appropriate padding and blankets. A time-out was performed to confirm the patient's identity and the correct laterality of surgery. The patient was then prepped and draped in the usual sterile fashion. Appropriate pre-operative antibiotics were administered. Tranexamic acid was administered preoperatively.    An incision that centered on the posterior tip of the greater trochanter with a posterior curve was made. Dissection was carried down through the subcutaneous tissue.  Careful attention was made to maintain hemostasis using electrocautery.  Dissection  brought [place] to the level of the deep fascia where the gluteus maximus muscle and proximal portion of the IT band were identified.  The proximal region of the IT band was incised in linear fashion and this incision was extended proximally in a curvilinear fashion to split the gluteus maximus muscle parallel to its fibers to minimize bleeding.  This was accomplished using a combination of bovie electrocautery as well as blunt dissection.  The trochanteric bursa was then visualized and dissected from anterior to posterior. A blunt homan retractor was placed beneath the abductors. The piriformis tendon and short external rotators were visualized. Bovie electrocautery was used to cut these with the capsule as one L-shaped flap. This was tagged at the corner with #5 Ethibond. At this point, the femoral neck fracture was visualized. An oscillating saw was used to make a new neck cut approximately 41mm above the lesser tuberosity with the use of a neck cut guide. The head was then freed from its remaining soft tissue attachments and measured. The head trial was then inserted into the acetabulum and the appropriate sized head was selected.    We then turned our attention to preparing the femoral canal. First, a box cut was performed utilizing the box osteotome. A canal finder was inserted by hand and sequential broaching was then performed. The calcar planer was inserted onto the broach and used to smooth the calcar appropriately.  A trial stem, +4 neck, and head were inserted into the acetabulum and placed through range of motion. Intraoperative radiographs were obtained to assess component position and leg length. The hip was again dislocated and the femoral trial components were removed.  The actual stem was inserted into the femoral canal and then driven onto the calcar.  The trial head with +8 neck was then inserted on the femoral component and found to be appropriate. The trial head was then removed and the permanent  head was Morse tapered onto the femoral stem and then reduced into the acetabulum.    The hip stability and length were reassessed and found to be satisfactory.  The wound was then copiously irrigated with normal saline solution. The tagged sutures of the capsule and piriformis were sewn to the gluteus medius tendon. This adequately closed the hip capsule. The IT band and gluteus maximus fascia were then closed with 0-Vicryl in a running, locked fashion. A mixture of Exparil and bupivicaine was administered.  The subdermal layer was closed with 2-0 Vicryl in a buried interrupted fashion. Skin was approximated with staples and covered with Xeroform and Honeycomb dressing.  An abduction pillow was placed. The patient was mobilized from the lateral position back to supine on the operating room table and then awakened from anesthesia without complication.  POSTOPERATIVE PLAN: The patient will be WBAT on operative extremity. Lovenox 40mg /day x 4 weeks to start on POD#1. Ancef x 24 hours. PT/OT on POD#1. Posterior hip precautions.

## 2020-07-04 NOTE — Plan of Care (Signed)

## 2020-07-04 NOTE — Transfer of Care (Signed)
Immediate Anesthesia Transfer of Care Note  Patient: Laura Deleon  Procedure(s) Performed: ARTHROPLASTY BIPOLAR HIP (HEMIARTHROPLASTY) (Left Hip)  Patient Location: PACU  Anesthesia Type:Spinal  Level of Consciousness: awake, alert  and oriented  Airway & Oxygen Therapy: Patient Spontanous Breathing and Patient connected to nasal cannula oxygen  Post-op Assessment: Report given to RN and Post -op Vital signs reviewed and stable  Post vital signs: Reviewed and stable  Last Vitals:  Vitals Value Taken Time  BP 111/77 07/04/20 1535  Temp 36.8 C 07/04/20 1535  Pulse 81 07/04/20 1536  Resp 16 07/04/20 1536  SpO2 100 % 07/04/20 1536  Vitals shown include unvalidated device data.  Last Pain:  Vitals:   07/04/20 1149  TempSrc:   PainSc: 6          Complications: No complications documented.

## 2020-07-04 NOTE — Progress Notes (Signed)
Ch attempted visit in response to OR for AD creation. Pt not in the room. RN let Ch know that Pt has not returned to room from surgery. Ch will follow-up on AD tomorrow.

## 2020-07-04 NOTE — Plan of Care (Signed)
  Problem: Coping: Goal: Level of anxiety will decrease 07/04/2020 0226 by Donnel Saxon, RN Outcome: Progressing 07/04/2020 0225 by Donnel Saxon, RN Outcome: Progressing   Problem: Pain Managment: Goal: General experience of comfort will improve 07/04/2020 0226 by Donnel Saxon, RN Outcome: Progressing 07/04/2020 0225 by Donnel Saxon, RN Outcome: Progressing   Problem: Elimination: Goal: Will not experience complications related to bowel motility 07/04/2020 0226 by Donnel Saxon, RN Outcome: Progressing 07/04/2020 0225 by Donnel Saxon, RN Outcome: Progressing Goal: Will not experience complications related to urinary retention 07/04/2020 0226 by Donnel Saxon, RN Outcome: Progressing 07/04/2020 0225 by Donnel Saxon, RN Outcome: Progressing   Problem: Safety: Goal: Ability to remain free from injury will improve 07/04/2020 0226 by Donnel Saxon, RN Outcome: Progressing 07/04/2020 0225 by Donnel Saxon, RN Outcome: Progressing   Problem: Skin Integrity: Goal: Risk for impaired skin integrity will decrease 07/04/2020 0226 by Donnel Saxon, RN Outcome: Progressing 07/04/2020 0225 by Donnel Saxon, RN Outcome: Progressing

## 2020-07-04 NOTE — H&P (Signed)
Paper H&P to be scanned into permanent record. H&P reviewed. No significant changes noted.  

## 2020-07-04 NOTE — Progress Notes (Signed)
PROGRESS NOTE  Laura Deleon JKK:[id number] DOB: 01-18-1946   PCP: Danelle Berry, PA-C  Patient is from: Home.  DOA: 07/03/2020 LOS: 1  Brief Narrative / Interim history: 74 year old female with history of HTN, osteopenia and dyslipidemia presenting after accidental fall and subsequent left hip pain and inability to ambulate.  He was found to have left femoral neck fracture with varus angulation.  Orthopedic surgery consulted.  Patient underwent left hip hemiarthroplasty Dr. Allena Katz on 07/04/2020.  Subjective: Seen and examined earlier this morning before she went down for surgery.  She endorses 7/10 pain in her left hip with minimal movement.  She denies chest pain, dyspnea, GI or UTI symptoms.  I encouraged her on strict bedrest until she has surgery.  Objective: Vitals:   07/04/20 0407 07/04/20 0909 07/04/20 1149 07/04/20 1535  BP: 129/70 129/68 140/76 111/77  Pulse: 87 72 86 80  Resp: 18 16 17 17   Temp: 98.5 F (36.9 C) 98.8 F (37.1 C) 100 F (37.8 C) 98.3 F (36.8 C)  TempSrc: Oral Oral    SpO2: 95% 95% 91% 98%  Weight:   56.7 kg   Height:   5\' 5"  (1.651 m)     Intake/Output Summary (Last 24 hours) at 07/04/2020 1540 Last data filed at 07/04/2020 1526 Gross per 24 hour  Intake 1800 ml  Output 1000 ml  Net 800 ml   Filed Weights   07/03/20 2223 07/04/20 1149  Weight: 56.7 kg 56.7 kg    Examination:  GENERAL: Frail looking elderly female.  Nontoxic. HEENT: MMM.  Vision and hearing grossly intact.  NECK: Supple.  No apparent JVD.  RESP:  No IWOB.  Fair aeration bilaterally. CVS:  RRR. Heart sounds normal.  ABD/GI/GU: BS+. Abd soft, NTND.  MSK/EXT: No apparent deformity.  No edema. SKIN: no apparent skin lesion or wound NEURO: Awake, alert and oriented appropriately.  No apparent focal neuro deficit. PSYCH: Calm. Normal affect.  Procedures:  8/25-left hip hemiarthroplasty by Dr. 2224  Microbiology summarized: COVID-19 PCR negative.  Assessment &  Plan: Fall at home-accidental Left femoral neck fracture with varus angulation -Status post left hip hemiarthroplasty by Dr. 07/06/20 on 8/25 -Pain control and VTE prophylaxis per orthopedic surgery -Bowel regimen -PT/OT eval  Essential hypertension: Hypotensive on admission.  Normotensive now. -Continue home metoprolol  Dyslipidemia -Continue home statin  Mild hyponatremia: -Continue IV NS  Hyperglycemia without diagnosis of diabetes: Could be stress-induced. -Check hemoglobin A1c  Hypokalemia-resolved.  Leukocytosis: Likely demargination. -Continue monitoring   Body mass index is 20.8 kg/m.         DVT prophylaxis:  SCDs Start: 07/03/20 2322  Code Status: Full code Family Communication: Patient and/or RN. Available if any question.  Status is: Inpatient  Remains inpatient appropriate because:IV treatments appropriate due to intensity of illness or inability to take PO and Inpatient level of care appropriate due to severity of illness   Dispo: The patient is from: Home              Anticipated d/c is to: SNF              Anticipated d/c date is: 2 days              Patient currently is not medically stable to d/c.       Consultants:  Orthopedic surgery   Sch Meds:  Scheduled Meds: . [MAR Hold] atorvastatin  10 mg Oral QODAY  . chlorhexidine      . [MAR Hold] ezetimibe  10 mg Oral Daily  . [MAR Hold] fluticasone  2 spray Each Nare Daily  . [MAR Hold] insulin aspart  0-9 Units Subcutaneous TID PC & HS  . [MAR Hold] metoprolol succinate  50 mg Oral Daily  . [MAR Hold] multivitamin with minerals  1 tablet Oral Daily  . [MAR Hold] potassium chloride  40 mEq Oral Once   Continuous Infusions: . sodium chloride 100 mL/hr at 07/04/20 0111  . tranexamic acid    . tranexamic acid     PRN Meds:.[MAR Hold] acetaminophen **OR** [MAR Hold] acetaminophen, fentaNYL, fentaNYL (SUBLIMAZE) injection, [MAR Hold] labetalol, [MAR Hold] loratadine, [MAR Hold] magnesium  hydroxide, [MAR Hold]  morphine injection, [MAR Hold] ondansetron **OR** [MAR Hold] ondansetron (ZOFRAN) IV, [MAR Hold] oxyCODONE-acetaminophen, [MAR Hold] traZODone  Antimicrobials: Anti-infectives (From admission, onward)   Start     Dose/Rate Route Frequency Ordered Stop   07/04/20 1244  ceFAZolin (ANCEF) 1-4 GM/50ML-% IVPB       Note to Pharmacy: Malva Limes   : cabinet override      07/04/20 1244 07/04/20 1405   07/04/20 1230  [MAR Hold]  ceFAZolin (ANCEF) IVPB 1 g/50 mL premix        (MAR Hold since Wed 07/04/2020 at 1136.Hold Reason: Transfer to a Procedural area.)   1 g 100 mL/hr over 30 Minutes Intravenous  Once 07/03/20 2334 07/04/20 1344       I have personally reviewed the following labs and images: CBC: Recent Labs  Lab 07/03/20 2220 07/04/20 0459  WBC 12.5* 14.2*  NEUTROABS 8.3*  --   HGB 14.4 13.6  HCT 43.6 40.5  MCV 87.7 86.9  PLT 219 195   BMP &GFR Recent Labs  Lab 07/03/20 2220 07/04/20 0459  NA 136 133*  K 3.3* 4.5  CL 99 98  CO2 27 28  GLUCOSE 202* 142*  BUN 12 9  CREATININE 0.54 0.65  CALCIUM 9.0 8.7*   Estimated Creatinine Clearance: 56.9 mL/min (by C-G formula based on SCr of 0.65 mg/dL). Liver & Pancreas: Recent Labs  Lab 07/03/20 2220  AST 30  ALT 21  ALKPHOS 56  BILITOT 0.5  PROT 7.1  ALBUMIN 4.1   No results for input(s): LIPASE, AMYLASE in the last 168 hours. No results for input(s): AMMONIA in the last 168 hours. Diabetic: Recent Labs    07/03/20 2220  HGBA1C 5.8*   Recent Labs  Lab 07/04/20 0406 07/04/20 0910  GLUCAP 131* 103*   Cardiac Enzymes: No results for input(s): CKTOTAL, CKMB, CKMBINDEX, TROPONINI in the last 168 hours. No results for input(s): PROBNP in the last 8760 hours. Coagulation Profile: Recent Labs  Lab 07/03/20 2220  INR 1.0   Thyroid Function Tests: No results for input(s): TSH, T4TOTAL, FREET4, T3FREE, THYROIDAB in the last 72 hours. Lipid Profile: No results for input(s): CHOL, HDL,  LDLCALC, TRIG, CHOLHDL, LDLDIRECT in the last 72 hours. Anemia Panel: No results for input(s): VITAMINB12, FOLATE, FERRITIN, TIBC, IRON, RETICCTPCT in the last 72 hours. Urine analysis: No results found for: COLORURINE, APPEARANCEUR, LABSPEC, PHURINE, GLUCOSEU, HGBUR, BILIRUBINUR, KETONESUR, PROTEINUR, UROBILINOGEN, NITRITE, LEUKOCYTESUR Sepsis Labs: Invalid input(s): PROCALCITONIN, LACTICIDVEN  Microbiology: Recent Results (from the past 240 hour(s))  SARS Coronavirus 2 by RT PCR (hospital order, performed in [facility] hospital lab) Nasopharyngeal Nasopharyngeal Swab     Status: None   Collection Time: 07/03/20 10:28 PM   Specimen: Nasopharyngeal Swab  Result Value Ref Range Status   SARS Coronavirus 2 NEGATIVE NEGATIVE Final    Comment: (  NOTE) SARS-CoV-2 target nucleic acids are NOT DETECTED.  The SARS-CoV-2 RNA is generally detectable in upper and lower respiratory specimens during the acute phase of infection. The lowest concentration of SARS-CoV-2 viral copies this assay can detect is 250 copies / mL. A negative result does not preclude SARS-CoV-2 infection and should not be used as the sole basis for treatment or other patient management decisions.  A negative result may occur with improper specimen collection / handling, submission of specimen other than nasopharyngeal swab, presence of viral mutation(s) within the areas targeted by this assay, and inadequate number of viral copies (<250 copies / mL). A negative result must be combined with clinical observations, patient history, and epidemiological information.  Fact Sheet for Patients:   BoilerBrush.com.cy  Fact Sheet for Healthcare Providers: https://pope.com/  This test is not yet approved or  cleared by the [place] FDA and has been authorized for detection and/or diagnosis of SARS-CoV-2 by FDA under an Emergency Use Authorization (EUA).  This EUA will remain in  effect (meaning this test can be used) for the duration of the COVID-19 declaration under Section 564(b)(1) of the Act, 21 U.S.C. section 360bbb-3(b)(1), unless the authorization is terminated or revoked sooner.  Performed at [facility], [address] [place], [place] [address]     Radiology Studies: DG Pelvis 1-2 Views  Result Date: 07/04/2020 CLINICAL DATA:  Status post left hemiarthroplasty EXAM: PELVIS - 1-2 VIEW COMPARISON:  None. FINDINGS: Intraoperative frontal view obtained. There is a drilling type prosthesis in place for hip arthroplasty on the left with the current prosthesis within the trabecular portion of bone. No fracture or dislocation. There is an apparent sponge along the lateral aspect of the left hip joint currently. Right hip joint appears unremarkable. IMPRESSION: Apparent drilling prosthesis on the left in essentially anatomic alignment. No acute fracture or dislocation. Apparent sponge lateral to the left hip joint. Electronically Signed   By: Bretta Bang III M.D.   On: 07/04/2020 15:24   DG Chest Port 1 View  Result Date: 07/03/2020 CLINICAL DATA:  Left hip fracture.  Post fall. EXAM: PORTABLE CHEST 1 VIEW COMPARISON:  Chest radiograph 08/17/2018 FINDINGS: The cardiomediastinal contours are normal. Mild aortic atherosclerosis. Pulmonary vasculature is normal. No consolidation, pleural effusion, or pneumothorax. No acute osseous abnormalities are seen. IMPRESSION: No acute chest findings. Electronically Signed   By: Narda Rutherford M.D.   On: 07/03/2020 23:07   DG Hip Unilat W or Wo Pelvis 2-3 Views Left  Result Date: 07/03/2020 CLINICAL DATA:  Fall, left hip pain EXAM: DG HIP (WITH OR WITHOUT PELVIS) 2-3V LEFT COMPARISON:  None. FINDINGS: There is a left femoral neck fracture with varus angulation. No subluxation or dislocation. SI joints symmetric. IMPRESSION: Left femoral neck fracture with varus angulation. Electronically Signed   By: Charlett Nose  M.D.   On: 07/03/2020 23:06     Lenzie Sandler T. Christropher [facility]  If 7PM-7AM, please contact night-coverage [email or web address] 07/04/2020, 3:40 PM

## 2020-07-04 NOTE — Anesthesia Preprocedure Evaluation (Signed)
Anesthesia Evaluation  Patient identified by MRN, date of birth, ID band Patient awake    Reviewed: Allergy & Precautions, H&P , NPO status , Patient's Chart, lab work & pertinent test results, reviewed documented beta blocker date and time   History of Anesthesia Complications Negative for: history of anesthetic complications  Airway Mallampati: I  TM Distance: >3 FB Neck ROM: full    Dental no notable dental hx. (+) Dental Advidsory Given, Edentulous Upper, Upper Dentures, Poor Dentition, Missing, Chipped   Pulmonary neg pulmonary ROS,    Pulmonary exam normal breath sounds clear to auscultation       Cardiovascular Exercise Tolerance: Good hypertension, (-) angina(-) Past MI and (-) Cardiac Stents Normal cardiovascular exam(-) dysrhythmias (-) Valvular Problems/Murmurs Rhythm:regular Rate:Normal     Neuro/Psych neg Seizures TIA Neuromuscular disease negative psych ROS   GI/Hepatic negative GI ROS, Neg liver ROS,   Endo/Other  negative endocrine ROS  Renal/GU negative Renal ROS  negative genitourinary   Musculoskeletal   Abdominal   Peds  Hematology negative hematology ROS (+)   Anesthesia Other Findings Past Medical History: No date: Allergy No date: Hernia of anterior abdominal wall No date: Hyperlipidemia No date: Hypertension 09/08/2017: Osteopenia     Comment:  Oct 2018; next scan => Sep 10, 2019   Reproductive/Obstetrics negative OB ROS                             Anesthesia Physical Anesthesia Plan  ASA: II  Anesthesia Plan: Spinal   Post-op Pain Management:    Induction: Intravenous  PONV Risk Score and Plan: 2 and Propofol infusion and TIVA  Airway Management Planned: Natural Airway and Simple Face Mask  Additional Equipment:   Intra-op Plan:   Post-operative Plan:   Informed Consent: I have reviewed the patients History and Physical, chart, labs and  discussed the procedure including the risks, benefits and alternatives for the proposed anesthesia with the patient or authorized representative who has indicated his/her understanding and acceptance.     Dental Advisory Given  Plan Discussed with: Anesthesiologist, CRNA and Surgeon  Anesthesia Plan Comments:         Anesthesia Quick Evaluation

## 2020-07-05 ENCOUNTER — Encounter: Payer: Self-pay | Admitting: Orthopedic Surgery

## 2020-07-05 LAB — GLUCOSE, CAPILLARY
Glucose-Capillary: 108 mg/dL — ABNORMAL HIGH (ref 70–99)
Glucose-Capillary: 125 mg/dL — ABNORMAL HIGH (ref 70–99)
Glucose-Capillary: 139 mg/dL — ABNORMAL HIGH (ref 70–99)
Glucose-Capillary: 132 mg/dL — ABNORMAL HIGH (ref 70–99)

## 2020-07-05 LAB — CBC
HCT: 35.7 % — ABNORMAL LOW (ref 36.0–46.0)
Hemoglobin: 12.2 g/dL (ref 12.0–15.0)
MCH: 29.9 pg (ref 26.0–34.0)
MCHC: 34.2 g/dL (ref 30.0–36.0)
MCV: 87.5 fL (ref 80.0–100.0)
Platelets: 166 10*3/uL (ref 150–400)
RBC: 4.08 MIL/uL (ref 3.87–5.11)
RDW: 13.3 % (ref 11.5–15.5)
WBC: 15.4 10*3/uL — ABNORMAL HIGH (ref 4.0–10.5)
nRBC: 0 % (ref 0.0–0.2)

## 2020-07-05 LAB — BASIC METABOLIC PANEL
Anion gap: 7 (ref 5–15)
BUN: 8 mg/dL (ref 8–23)
CO2: 26 mmol/L (ref 22–32)
Calcium: 8.5 mg/dL — ABNORMAL LOW (ref 8.9–10.3)
Chloride: 103 mmol/L (ref 98–111)
Creatinine, Ser: 0.52 mg/dL (ref 0.44–1.00)
GFR calc Af Amer: >60 mL/min (ref >60)
GFR calc non Af Amer: >60 mL/min (ref >60)
Glucose, Bld: 137 mg/dL — ABNORMAL HIGH (ref 70–99)
Potassium: 3.7 mmol/L (ref 3.5–5.1)
Sodium: 136 mmol/L (ref 135–145)

## 2020-07-05 LAB — MAGNESIUM: Magnesium: 2 mg/dL (ref 1.7–2.4)

## 2020-07-05 MED ORDER — TRAMADOL HCL 50 MG PO TABS
50.0000 mg | ORAL_TABLET | Freq: Four times a day (QID) | ORAL | 0 refills | Status: DC | PRN
Start: 2020-07-05 — End: 2020-08-16

## 2020-07-05 MED ORDER — OXYCODONE HCL 5 MG PO TABS: 2.5000 mg | ORAL_TABLET | ORAL | 0 refills | Status: DC | PRN

## 2020-07-05 MED ORDER — ENOXAPARIN SODIUM 40 MG/0.4ML ~~LOC~~ SOLN: 40.0000 mg | SUBCUTANEOUS | 0 refills | Status: DC

## 2020-07-05 NOTE — Progress Notes (Signed)
Physical Therapy Treatment Patient Details Name: Laura Deleon MRN: [id number] DOB: 09-09-1946 Today's Date: 07/05/2020    History of Present Illness Pt is a 74 year old female with history of HTN, osteopenia and dyslipidemia presenting after accidental fall and subsequent left hip pain and inability to ambulate.  underwent L hip hemiarthroplasty 8/25. Posterior hip precautions, WBAT.    PT Comments    Pt alert, agreeable to PT, but wished to remain in recliner. Pt able to perform several exercises, physical assistance needed for LLE due to pain. Sit <> stand from recliner with modA and RW, actual ambulation deferred due to pain, but able to stand for 2-3 minutes to allow for vitals assessment by CNA. cued for weight shifting in standing with poor LLE weight bearing noted. Repositioned in chair with all needs in reach. The patient would benefit from further skilled PT intervention to continue to progress towards goals. Recommendation remains appropriate.      Follow Up Recommendations  SNF     Equipment Recommendations  Rolling walker with 5" wheels;3in1 (PT)    Recommendations for Other Services       Precautions / Restrictions Precautions Precautions: Fall;Posterior Hip Precaution Booklet Issued: Yes (comment) Restrictions Weight Bearing Restrictions: Yes RLE Weight Bearing: Weight bearing as tolerated LLE Weight Bearing: Weight bearing as tolerated    Mobility  Bed Mobility Overal bed mobility: Needs Assistance             General bed mobility comments: pt in recliner at start adn end of session  Transfers Overall transfer level: Needs assistance Equipment used: Rolling walker (2 wheeled) Transfers: Sit to/from Stand   Stand pivot transfers: Mod assist       General transfer comment: heavy use of UEs  Ambulation/Gait             General Gait Details: deferred due to pain, but able to stand for 2-3 minutes to allow for vitals assessment, cued for weight  shifting in standing with poor LLE weight bearing noted.   Stairs             Wheelchair Mobility    Modified Rankin (Stroke Patients Only)       Balance Overall balance assessment: Needs assistance Sitting-balance support: Feet supported Sitting balance-Leahy Scale: Fair       Standing balance-Leahy Scale: Poor Standing balance comment: reliant on physical assist and RW                            Cognition Arousal/Alertness: Awake/alert Behavior During Therapy: WFL for tasks assessed/performed Overall Cognitive Status: Within Functional Limits for tasks assessed                                        Exercises General Exercises - Lower Extremity Ankle Circles/Pumps: AROM;Both;15 reps Quad Sets: AROM;Both;15 reps;Strengthening Gluteal Sets: Both;AROM;15 reps;Strengthening Heel Slides: AAROM;Strengthening;Left;15 reps;AROM;Right Hip ABduction/ADduction: AROM;Strengthening;Right;15 reps;AAROM;Left Other Exercises Other Exercises: Pt/son instructed in falls prevention strategies, AE/DME for ADL, posterior THPs and how to maintain    General Comments        Pertinent Vitals/Pain Pain Assessment: 0-10 Pain Score: 2  Pain Location: L hip/low back at rest Pain Descriptors / Indicators: Aching;Tender Pain Intervention(s): Limited activity within patient's tolerance;Monitored during session;Premedicated before session;Repositioned    Home Living Family/patient expects to be discharged to:: Private residence Living Arrangements: Alone;Other relatives (  great grandson (10 yrs old)) Available Help at Discharge: Family Type of Home: Mobile home Home Access: Stairs to enter Entrance Stairs-Rails: Right;Left;Can reach both Home Layout: One level Home Equipment: None      Prior Function Level of Independence: Independent      Comments: ambulatory without AD at baseline, no other falls in the last year. grandson will grab items for her  if needed   PT Goals (current goals can now be found in the care plan section) Acute Rehab PT Goals Patient Stated Goal: to go to rehab and get back to yardwork Progress towards PT goals: Progressing toward goals    Frequency    BID      PT Plan Current plan remains appropriate    Co-evaluation              AM-PAC PT "6 Clicks" Mobility   Outcome Measure  Help needed turning from your back to your side while in a flat bed without using bedrails?: A Lot Help needed moving from lying on your back to sitting on the side of a flat bed without using bedrails?: A Lot Help needed moving to and from a bed to a chair (including a wheelchair)?: A Lot Help needed standing up from a chair using your arms (e.g., wheelchair or bedside chair)?: A Lot Help needed to walk in hospital room?: A Lot Help needed climbing 3-5 steps with a railing? : Total 6 Click Score: 11    End of Session Equipment Utilized During Treatment: Gait belt Activity Tolerance: Patient limited by pain Patient left: in chair;with family/visitor present;with SCD's reapplied;with call bell/phone within reach;with chair alarm set;with nursing/sitter in room Nurse Communication: Mobility status PT Visit Diagnosis: Other abnormalities of gait and mobility (R26.89);Pain;Difficulty in walking, not elsewhere classified (R26.2) Pain - Right/Left: Left Pain - part of body: Hip     Time: 0762-2633 PT Time Calculation (min) (ACUTE ONLY): 24 min  Charges:  $Therapeutic Exercise: 23-37 mins                     Olga Coaster PT, DPT 4:23 PM,07/05/20

## 2020-07-05 NOTE — Evaluation (Signed)
Occupational Therapy Evaluation Patient Details Name: Laura Deleon MRN: [id number] DOB: 1946-06-02 Today's Date: 07/05/2020    History of Present Illness Pt is a 74 year old female with history of HTN, osteopenia and dyslipidemia presenting after accidental fall and subsequent left hip pain and inability to ambulate.  underwent L hip hemiarthroplasty 8/25. Posterior hip precautions, WBAT.   Clinical Impression   Pt seen for OT evaluation this date, POD#1 from above surgery. Pt was independent in all ADL prior to fall and subsequent surgical repair. Pt is eager to return to PLOF with less pain and improved safety and independence. Pt currently requires MOD assist for LB dressing and bathing while in seated position due to significant increase in pain with movement and limited AROM of L hip and hip precautions. Pt able to recall 2/3 posterior total hip precautions at start of session and unable to verbalize how to implement during ADL and mobility. Pt/son instructed in posterior total hip precautions and how to implement, self care skills, falls prevention strategies, home/routines modifications, DME/AE for LB bathing and dressing tasks. At end of session, pt able to recall 3/3 posterior total hip precautions and able to verbalize why she had pillows in between her legs. Pt would benefit from additional instruction in self care skills and techniques to help maintain precautions with or without assistive devices to support recall and carryover prior to discharge. Recommend SNF upon discharge.       Follow Up Recommendations  SNF;Supervision/Assistance - 24 hour    Equipment Recommendations  3 in 1 bedside commode    Recommendations for Other Services       Precautions / Restrictions Precautions Precautions: Fall;Posterior Hip Precaution Booklet Issued: Yes (comment) Restrictions Weight Bearing Restrictions: Yes LLE Weight Bearing: Weight bearing as tolerated      Mobility Bed  Mobility Overal bed mobility: Needs Assistance Bed Mobility: Supine to Sit     Supine to sit: Mod assist     General bed mobility comments: pt in recliner at start adn end of session  Transfers Overall transfer level: Needs assistance Equipment used: 2 person hand held assist;Rolling walker (2 wheeled) Transfers: Sit to/from [employer]   Stand pivot transfers: Mod assist;+2 physical assistance       General transfer comment: pt declined    Balance Overall balance assessment: Needs assistance Sitting-balance support: Feet supported Sitting balance-Leahy Scale: Fair       Standing balance-Leahy Scale: Poor Standing balance comment: reliant on physical assist and RW                           ADL either performed or assessed with clinical judgement   ADL Overall ADL's : Needs assistance/impaired                                       General ADL Comments: Pt requires Mod A for LB bathing and dressing tasks, Mod A 1-2 assist for ADL transfers; pain limited     Vision Baseline Vision/History: Wears glasses Wears Glasses: Reading only Patient Visual Report: No change from baseline       Perception     Praxis      Pertinent Vitals/Pain Pain Assessment: 0-10 Pain Score: 2  Pain Location: L hip/low back at rest Pain Descriptors / Indicators: Aching;Tender Pain Intervention(s): Limited activity within patient's tolerance;Monitored during session;Premedicated before session  Hand Dominance Right   Extremity/Trunk Assessment Upper Extremity Assessment Upper Extremity Assessment: Generalized weakness   Lower Extremity Assessment Lower Extremity Assessment: Generalized weakness;LLE deficits/detail LLE Deficits / Details: s/p L hip hemiarthroplasty       Communication Communication Communication: No difficulties   Cognition Arousal/Alertness: Awake/alert Behavior During Therapy: WFL for tasks  assessed/performed Overall Cognitive Status: Within Functional Limits for tasks assessed                                     General Comments       Exercises Other Exercises Other Exercises: assist to [facility], 2+ for safety, dependent on CNA help for wiping. Other Exercises: cued for breathing during mobility due to pt tending to hold breath, good carryover with verbal cues Other Exercises: Pt endorsed lightheadedness at EOB that did not worsen or improve, but very motivated by need to void. Up in chair at end of session pt reported resolvement on lightheadedness Other Exercises: Pt/son instructed in falls prevention strategies, AE/DME for ADL, posterior THPs and how to maintain   Shoulder Instructions      Home Living Family/patient expects to be discharged to:: Private residence Living Arrangements: Alone;Other relatives (great grandson (10 yrs old)) Available Help at Discharge: Family Type of Home: Mobile home Home Access: Stairs to enter Entrance Stairs-Number of Steps: 3-4 Entrance Stairs-Rails: Right;Left;Can reach both Home Layout: One level     Bathroom Shower/Tub: Producer, television/film/video: Standard     Home Equipment: None          Prior Functioning/Environment Level of Independence: Independent        Comments: ambulatory without AD at baseline, no other falls in the last year. grandson will grab items for her if needed        OT Problem List: Decreased strength;Decreased range of motion;Pain;Impaired balance (sitting and/or standing);Decreased knowledge of precautions;Decreased knowledge of use of DME or AE      OT Treatment/Interventions: Self-care/ADL training;Therapeutic exercise;Therapeutic activities;DME and/or AE instruction;Patient/family education;Balance training    OT Goals(Current goals can be found in the care plan section) Acute Rehab OT Goals Patient Stated Goal: to go to rehab and get back to yardwork OT Goal  Formulation: With patient/family Time For Goal Achievement: 07/19/20 Potential to Achieve Goals: Good ADL Goals Pt Will Perform Lower Body Dressing: with min assist;sit to/from stand;with adaptive equipment (maintaining hip precautions) Pt Will Transfer to Toilet: with min assist;bedside commode;stand pivot transfer (maintaining hip precautions) Additional ADL Goal #1: Pt will independently verbalize plan to implement at least 2+ learned falls prevention strategies to maximize safety Additional ADL Goal #2: Pt will independently verbalize 3/3 posterior total hip precautions and how to maintain during ADL and ADL mobility.  OT Frequency: Min 2X/week   Barriers to D/C:            Co-evaluation              AM-PAC OT "6 Clicks" Daily Activity     Outcome Measure Help from another person eating meals?: None Help from another person taking care of personal grooming?: None Help from another person toileting, which includes using toliet, bedpan, or urinal?: A Lot Help from another person bathing (including washing, rinsing, drying)?: A Lot Help from another person to put on and taking off regular upper body clothing?: A Little Help from another person to put on and taking off regular lower body  clothing?: A Lot 6 Click Score: 17   End of Session    Activity Tolerance: Patient tolerated treatment well Patient left: in chair;with call bell/phone within reach;with chair alarm set;with SCD's reapplied;with family/visitor present  OT Visit Diagnosis: Other abnormalities of gait and mobility (R26.89);Muscle weakness (generalized) (M62.81);Pain Pain - Right/Left: Left Pain - part of body: Hip                Time: 4034-7425 OT Time Calculation (min): 26 min Charges:  OT General Charges $OT Visit: 1 Visit OT Evaluation $OT Eval Moderate Complexity: 1 Mod OT Treatments $Self Care/Home Management : 8-22 mins  Richrd Prime, MPH, MS, OTR/L ascom [phone] 07/05/20, 2:54 PM

## 2020-07-05 NOTE — Progress Notes (Signed)
Ch visited with Pt and Pt's son [place] as follow-up from yesterday's OR. Pt explained being in pain after surgery. Ch did AD education. Pt says she wants to assign both children to HPOA in order. Pt wanted to look over AD in detail before asking for notarization. Pt will ask for chaplains when she is ready. Pt requested prayer. CH prayed with them. Pt relies on God for support at this time. Ch will follow-up.

## 2020-07-05 NOTE — Progress Notes (Signed)
OT Cancellation Note  Patient Details Name: Laura Deleon MRN: [id number] DOB: [birth year]   Cancelled Treatment:    Reason Eval/Treat Not Completed: Other (comment). Consult received, chart reviewed. Pt working with PT upon attempt. Will re-attempt at later time today.   Richrd Prime, MPH, MS, OTR/L ascom [phone] 07/05/20, 10:45 AM

## 2020-07-05 NOTE — Discharge Instructions (Signed)

## 2020-07-05 NOTE — Evaluation (Signed)
Physical Therapy Evaluation Patient Details Name: Laura Deleon MRN: [id number] DOB: [birth year] Today's Date: 07/05/2020   History of Present Illness  Pt is a 74 year old female with history of HTN, osteopenia and dyslipidemia presenting after accidental fall and subsequent left hip pain and inability to ambulate.  underwent L hip hemiarthroplasty 8/25. Posterior hip precautions, WBAT.    Clinical Impression  Pt alert, oriented, reported 2/10 pain in L hip at rest, 5/10 sitting on BSC and significant increase in pain with mobility. Reported at baseline she is independent, lives with her great grandson (81years old).  The patient requested to urinate, supine to sit performed modA, and several minutes spent in sitting due to pt complaints of pain, nausea, and lightheadedness. Improvement in nausea noted with time, pt stated her lightheadedness did not improve but also did not worsen. Stand pivot performed modAx2 to [facility], pt exhibited fair sitting balance. Sit <> stand from [facility] with modA, dependent on CNA assist for wiping. The pt was educated on WBAT status, endorsed significant pain with attempts, able to take several steps to recliner with TTWB on LLE and constant cueing for sequencing. modA to assist with weight shift and address unsteadiness. 2+ for safety.  Overall the patient demonstrated deficits (see "PT Problem List") that impede the patient's functional abilities, safety, and mobility and would benefit from skilled PT intervention. Recommendation is SNF due to decreased caregiver support and current level of assistance needed.    Follow Up Recommendations SNF    Equipment Recommendations  Rolling walker with 5" wheels;3in1 (PT)    Recommendations for Other Services       Precautions / Restrictions Precautions Precautions: Fall;Posterior Hip Precaution Booklet Issued: Yes (comment) Restrictions Weight Bearing Restrictions: Yes RLE Weight Bearing: Weight bearing as tolerated       Mobility  Bed Mobility Overal bed mobility: Needs Assistance Bed Mobility: Supine to Sit     Supine to sit: Mod assist     General bed mobility comments: pt needed significant assist for LE management, light assist for trunk elevation  Transfers Overall transfer level: Needs assistance Equipment used: 2 person hand held assist;Rolling walker (2 wheeled) Transfers: Sit to/from [employer] Transfers   Stand pivot transfers: Mod assist;+2 physical assistance       General transfer comment: from EOB to [facility] to allow pt to void (stand pivot), sit <> stand from [facility] to recliner  Ambulation/Gait Ambulation/Gait assistance: Mod assist;+2 safety/equipment Gait Distance (Feet): 2 Feet Assistive device: Rolling walker (2 wheeled)       General Gait Details: pt educated on WBAT status, endorsed significant pain with attempts, able to take several steps to recliner with TTWB on LLE. modA to assist with weight shift and address unsteadiness. 2+ for safety.  Stairs            Wheelchair Mobility    Modified Rankin (Stroke Patients Only)       Balance Overall balance assessment: Needs assistance Sitting-balance support: Feet supported Sitting balance-Leahy Scale: Fair       Standing balance-Leahy Scale: Poor Standing balance comment: reliant on physical assist and RW                             Pertinent Vitals/Pain Pain Assessment: 0-10 Pain Score: 5  Pain Location: L hip seated on commode Pain Descriptors / Indicators: Aching;Tender Pain Intervention(s): Limited activity within patient's tolerance;Monitored during session;Premedicated before session;Repositioned    Home Living Family/patient expects  to be discharged to:: Private residence Living Arrangements: Alone;Other relatives (great grandson (10 yrs old)) Available Help at Discharge: Family Type of Home: Mobile home Home Access: Stairs to enter Entrance Stairs-Rails: Right;Left;Can reach  both Entrance Stairs-Number of Steps: 3-4 Home Layout: One level Home Equipment: None      Prior Function Level of Independence: Independent         Comments: ambulatory without AD at baseline, no other falls in the last year. grandson will grab items for her if needed     Hand Dominance   Dominant Hand: Right    Extremity/Trunk Assessment   Upper Extremity Assessment Upper Extremity Assessment: Generalized weakness    Lower Extremity Assessment Lower Extremity Assessment: RLE deficits/detail;LLE deficits/detail RLE Deficits / Details: pt endorsed weakness in this LE as well,stated it was heavy, needed physical assist to move it LLE Deficits / Details: s/p L hip hemiarthroplasty       Communication   Communication: No difficulties  Cognition Arousal/Alertness: Awake/alert Behavior During Therapy: WFL for tasks assessed/performed Overall Cognitive Status: Within Functional Limits for tasks assessed                                        General Comments      Exercises Other Exercises Other Exercises: assist to [facility], 2+ for safety, dependent on CNA help for wiping. Other Exercises: cued for breathing during mobility due to pt tending to hold breath, good carryover with verbal cues Other Exercises: Pt endorsed lightheadedness at EOB that did not worsen or improve, but very motivated by need to void. Up in chair at end of session pt reported resolvement on lightheadedness   Assessment/Plan    PT Assessment Patient needs continued PT services  PT Problem List Decreased strength;Decreased range of motion;Decreased activity tolerance;Decreased balance;Decreased knowledge of precautions;Pain;Decreased mobility;Decreased knowledge of use of DME       PT Treatment Interventions DME instruction;Balance training;Gait training;Neuromuscular re-education;Stair training;Functional mobility training;Patient/family education;Therapeutic activities;Therapeutic  exercise    PT Goals (Current goals can be found in the Care Plan section)  Acute Rehab PT Goals Patient Stated Goal: to decrease pain PT Goal Formulation: With patient Time For Goal Achievement: 07/19/20 Potential to Achieve Goals: Good    Frequency BID   Barriers to discharge Inaccessible home environment;Decreased caregiver support      Co-evaluation               AM-PAC PT "6 Clicks" Mobility  Outcome Measure Help needed turning from your back to your side while in a flat bed without using bedrails?: A Lot Help needed moving from lying on your back to sitting on the side of a flat bed without using bedrails?: A Lot Help needed moving to and from a bed to a chair (including a wheelchair)?: A Lot Help needed standing up from a chair using your arms (e.g., wheelchair or bedside chair)?: A Lot Help needed to walk in hospital room?: A Lot Help needed climbing 3-5 steps with a railing? : Total 6 Click Score: 11    End of Session Equipment Utilized During Treatment: Gait belt Activity Tolerance: Patient limited by pain;Other (comment) (limited by lightheadedness) Patient left: in chair;with family/visitor present;with SCD's reapplied;with call bell/phone within reach;with chair alarm set;with nursing/sitter in room Nurse Communication: Mobility status PT Visit Diagnosis: Other abnormalities of gait and mobility (R26.89);Pain;Difficulty in walking, not elsewhere classified (R26.2) Pain - Right/Left:  Left Pain - part of body: Hip    Time: 1011-1046 PT Time Calculation (min) (ACUTE ONLY): 35 min   Charges:   PT Evaluation $PT Eval Low Complexity: 1 Low PT Treatments $Therapeutic Exercise: 8-22 mins $Therapeutic Activity: 8-22 mins      Olga Coaster PT, DPT 11:04 AM,07/05/20

## 2020-07-05 NOTE — Progress Notes (Signed)
PROGRESS NOTE  Laura Deleon IOX:[id number] DOB: February 10, 1946   PCP: Danelle Berry, PA-C  Patient is from: Home.  DOA: 07/03/2020 LOS: 2  Brief Narrative / Interim history: 74 year old female with history of HTN, osteopenia and dyslipidemia presenting after accidental fall and subsequent left hip pain and inability to ambulate.  He was found to have left femoral neck fracture with varus angulation.  Orthopedic surgery consulted.  Patient underwent left hip hemiarthroplasty Dr. Allena Katz on 07/04/2020.  Subjective: Seen and examined earlier this morning.  Patient had severe pain last night.  Pain improved this morning after pain medication.  She rates her pain 1/10 now.  She denies chest pain, dyspnea, nausea, vomiting or abdominal pain other than some discomfort over her abdominal hernia.  She denies UTI symptoms.  Anxious about getting up and walking with therapy today  Objective: Vitals:   07/04/20 2206 07/04/20 2327 07/05/20 0755 07/05/20 1143  BP: 120/73 107/70 131/73 110/65  Pulse: (!) 58 62 80 69  Resp: 16 17 17 17   Temp: 98.3 F (36.8 C) 98 F (36.7 C) 98.1 F (36.7 C) 97.8 F (36.6 C)  TempSrc: Oral Oral Oral   SpO2: 100% 97% 96% 96%  Weight:      Height:        Intake/Output Summary (Last 24 hours) at 07/05/2020 1212 Last data filed at 07/05/2020 0500 Gross per 24 hour  Intake 3048.44 ml  Output 2900 ml  Net 148.44 ml   Filed Weights   07/03/20 2223 07/04/20 1149  Weight: 56.7 kg 56.7 kg    Examination:  GENERAL: No apparent distress.  Nontoxic. HEENT: MMM.  Vision and hearing grossly intact.  NECK: Supple.  No apparent JVD.  RESP: On RA.  No IWOB.  Fair aeration bilaterally. CVS:  RRR. Heart sounds normal.  ABD/GI/GU: BS+. Abd soft, NTND.  Reducible abdominal hernia over LLQ MSK/EXT:  Moves extremities. No apparent deformity. No edema.  SKIN: Surgical dressing DCI. NEURO: Awake, alert and oriented appropriately.  No apparent focal neuro deficit. PSYCH: Calm.  Normal affect.   Procedures:  8/25-left hip hemiarthroplasty by Dr. 07/06/20  Microbiology summarized: COVID-19 PCR negative.  Assessment & Plan: Fall at home-accidental Left femoral neck fracture with varus angulation -Status post left hip hemiarthroplasty by Dr. Allena Katz on 8/25 -Pain control and VTE prophylaxis per orthopedic surgery -Bowel regimen -PT/OT eval  Essential hypertension: Hypotensive on admission.  Normotensive now. -Continue home metoprolol  Dyslipidemia -Continue home statin  Mild hyponatremia: -Continue IV NS  Hyperglycemia without diagnosis of diabetes: Could be stress-induced. -Check hemoglobin A1c  Hypokalemia-resolved.  Leukocytosis: Likely demargination. -Continue monitoring   Body mass index is 20.8 kg/m.         DVT prophylaxis:  enoxaparin (LOVENOX) injection 40 mg Start: 07/05/20 0800 SCDs Start: 07/04/20 1714  Code Status: Full code Family Communication: Patient and/or RN. Available if any question.  Status is: Inpatient  Remains inpatient appropriate because:Unsafe d/c plan   Dispo: The patient is from: Home              Anticipated d/c is to: SNF              Anticipated d/c date is: 1 day              Patient currently is not medically stable to d/c.       Consultants:  Orthopedic surgery   Sch Meds:  Scheduled Meds: . acetaminophen  1,000 mg Oral Q8H  . aspirin  81 mg  Oral Daily  . atorvastatin  10 mg Oral QODAY  . Chlorhexidine Gluconate Cloth  6 each Topical Daily  . docusate sodium  100 mg Oral BID  . enoxaparin (LOVENOX) injection  40 mg Subcutaneous Q24H  . ezetimibe  10 mg Oral Daily  . insulin aspart  0-9 Units Subcutaneous TID PC & HS  . metoprolol succinate  50 mg Oral Daily  . multivitamin with minerals  1 tablet Oral Daily   Continuous Infusions: . sodium chloride 75 mL/hr at 07/04/20 1822   PRN Meds:.bisacodyl, HYDROmorphone (DILAUDID) injection, labetalol, loratadine, magnesium hydroxide,  menthol-cetylpyridinium **OR** phenol, metoCLOPramide **OR** metoCLOPramide (REGLAN) injection, ondansetron **OR** ondansetron (ZOFRAN) IV, oxyCODONE, oxyCODONE, senna-docusate, traMADol, traZODone  Antimicrobials: Anti-infectives (From admission, onward)   Start     Dose/Rate Route Frequency Ordered Stop   07/04/20 2000  ceFAZolin (ANCEF) IVPB 1 g/50 mL premix        1 g 100 mL/hr over 30 Minutes Intravenous Every 6 hours 07/04/20 1713 07/05/20 0254   07/04/20 1244  ceFAZolin (ANCEF) 1-4 GM/50ML-% IVPB       Note to Pharmacy: Malva Limes   : cabinet override      07/04/20 1244 07/04/20 1405   07/04/20 1230  ceFAZolin (ANCEF) IVPB 1 g/50 mL premix        1 g 100 mL/hr over 30 Minutes Intravenous  Once 07/03/20 2334 07/04/20 1344       I have personally reviewed the following labs and images: CBC: Recent Labs  Lab 07/03/20 2220 07/04/20 0459 07/05/20 0504  WBC 12.5* 14.2* 15.4*  NEUTROABS 8.3*  --   --   HGB 14.4 13.6 12.2  HCT 43.6 40.5 35.7*  MCV 87.7 86.9 87.5  PLT 219 195 166   BMP &GFR Recent Labs  Lab 07/03/20 2220 07/04/20 0459 07/05/20 0504  NA 136 133* 136  K 3.3* 4.5 3.7  CL 99 98 103  CO2 27 28 26   GLUCOSE 202* 142* 137*  BUN 12 9 8   CREATININE 0.54 0.65 0.52  CALCIUM 9.0 8.7* 8.5*  MG  --   --  2.0   Estimated Creatinine Clearance: 56.9 mL/min (by C-G formula based on SCr of 0.52 mg/dL). Liver & Pancreas: Recent Labs  Lab 07/03/20 2220  AST 30  ALT 21  ALKPHOS 56  BILITOT 0.5  PROT 7.1  ALBUMIN 4.1   No results for input(s): LIPASE, AMYLASE in the last 168 hours. No results for input(s): AMMONIA in the last 168 hours. Diabetic: Recent Labs    07/03/20 2220  HGBA1C 5.8*   Recent Labs  Lab 07/04/20 1604 07/04/20 1844 07/04/20 2108 07/05/20 0755 07/05/20 1144  GLUCAP 101* 141* 169* 108* 125*   Cardiac Enzymes: No results for input(s): CKTOTAL, CKMB, CKMBINDEX, TROPONINI in the last 168 hours. No results for input(s): PROBNP in  the last 8760 hours. Coagulation Profile: Recent Labs  Lab 07/03/20 2220  INR 1.0   Thyroid Function Tests: No results for input(s): TSH, T4TOTAL, FREET4, T3FREE, THYROIDAB in the last 72 hours. Lipid Profile: No results for input(s): CHOL, HDL, LDLCALC, TRIG, CHOLHDL, LDLDIRECT in the last 72 hours. Anemia Panel: No results for input(s): VITAMINB12, FOLATE, FERRITIN, TIBC, IRON, RETICCTPCT in the last 72 hours. Urine analysis: No results found for: COLORURINE, APPEARANCEUR, LABSPEC, PHURINE, GLUCOSEU, HGBUR, BILIRUBINUR, KETONESUR, PROTEINUR, UROBILINOGEN, NITRITE, LEUKOCYTESUR Sepsis Labs: Invalid input(s): PROCALCITONIN, LACTICIDVEN  Microbiology: Recent Results (from the past 240 hour(s))  SARS Coronavirus 2 by RT PCR (hospital order, performed in [facility] hospital lab) Nasopharyngeal Nasopharyngeal Swab     Status: None   Collection Time: 07/03/20 10:28 PM   Specimen: Nasopharyngeal Swab  Result Value Ref Range Status   SARS Coronavirus 2 NEGATIVE NEGATIVE Final    Comment: (NOTE) SARS-CoV-2 target nucleic acids are NOT DETECTED.  The SARS-CoV-2 RNA is generally detectable in upper and lower respiratory specimens during the acute phase of infection. The lowest concentration of SARS-CoV-2 viral copies this assay can detect is 250 copies / mL. A negative result does not preclude SARS-CoV-2 infection and should not be used as the sole basis for treatment or other patient management decisions.  A negative result may occur with improper specimen collection / handling, submission of specimen other than nasopharyngeal swab, presence of viral mutation(s) within the areas targeted by this assay, and inadequate number of viral copies (<250 copies / mL). A negative result must be combined with clinical observations, patient history, and epidemiological information.  Fact Sheet for Patients:   BoilerBrush.com.cy  Fact Sheet for Healthcare  Providers: https://pope.com/  This test is not yet approved or  cleared by the [place] FDA and has been authorized for detection and/or diagnosis of SARS-CoV-2 by FDA under an Emergency Use Authorization (EUA).  This EUA will remain in effect (meaning this test can be used) for the duration of the COVID-19 declaration under Section 564(b)(1) of the Act, 21 U.S.C. section 360bbb-3(b)(1), unless the authorization is terminated or revoked sooner.  Performed at [facility], [address] [place], [place] [address]     Radiology Studies: DG Pelvis 1-2 Views  Result Date: 07/04/2020 CLINICAL DATA:  Status post left hemiarthroplasty EXAM: PELVIS - 1-2 VIEW COMPARISON:  None. FINDINGS: Intraoperative frontal view obtained. There is a drilling type prosthesis in place for hip arthroplasty on the left with the current prosthesis within the trabecular portion of bone. No fracture or dislocation. There is an apparent sponge along the lateral aspect of the left hip joint currently. Right hip joint appears unremarkable. IMPRESSION: Apparent drilling prosthesis on the left in essentially anatomic alignment. No acute fracture or dislocation. Apparent sponge lateral to the left hip joint. Electronically Signed   By: Bretta Bang III M.D.   On: 07/04/2020 15:24   DG Pelvis Portable  Result Date: 07/04/2020 CLINICAL DATA:  Postop EXAM: PORTABLE PELVIS 1-2 VIEWS COMPARISON:  07/04/2020 FINDINGS: Pubic symphysis and rami appear intact. Status post left hip replacement with intact hardware and normal alignment. Gas in the soft tissues consistent with recent surgery. IMPRESSION: Status post left hip replacement with expected postsurgical change. Electronically Signed   By: Jasmine Pang M.D.   On: 07/04/2020 16:12     Avilene Marrin T. Kimyah Frein [facility]  If 7PM-7AM, please contact night-coverage [email or web address] 07/05/2020, 12:12 PM

## 2020-07-05 NOTE — Progress Notes (Signed)
  Subjective: 1 Day Post-Op Procedure(s) (LRB): ARTHROPLASTY BIPOLAR HIP (HEMIARTHROPLASTY) (Left) Patient reports pain as moderate.   Patient is well, and has had no acute complaints or problems Plan is to go Rehab versus home after hospital stay. Negative for chest pain and shortness of breath Fever: no Gastrointestinal: Negative for nausea and vomiting  Objective: Vital signs in last 24 hours: Temp:  [97.9 F (36.6 C)-100 F (37.8 C)] 98 F (36.7 C) (08/25 2327) Pulse Rate:  [51-86] 62 (08/25 2327) Resp:  [15-25] 17 (08/25 2327) BP: (105-174)/(59-78) 107/70 (08/25 2327) SpO2:  [91 %-100 %] 97 % (08/25 2327) Weight:  [56.7 kg] 56.7 kg (08/25 1149)  Intake/Output from previous day:  Intake/Output Summary (Last 24 hours) at 07/05/2020 0654 Last data filed at 07/05/2020 0500 Gross per 24 hour  Intake 3048.44 ml  Output 2900 ml  Net 148.44 ml    Intake/Output this shift: Total I/O In: 538.5 [I.V.:438.5; IV Piggyback:100] Out: 1400 [Urine:1400]  Labs: Recent Labs    07/03/20 2220 07/04/20 0459 07/05/20 0504  HGB 14.4 13.6 12.2   Recent Labs    07/04/20 0459 07/05/20 0504  WBC 14.2* 15.4*  RBC 4.66 4.08  HCT 40.5 35.7*  PLT 195 166   Recent Labs    07/04/20 0459 07/05/20 0504  NA 133* 136  K 4.5 3.7  CL 98 103  CO2 28 26  BUN 9 8  CREATININE 0.65 0.52  GLUCOSE 142* 137*  CALCIUM 8.7* 8.5*   Recent Labs    07/03/20 2220  INR 1.0     EXAM General - Patient is Alert and Oriented Extremity - Neurovascular intact Sensation intact distally Dorsiflexion/Plantar flexion intact Compartment soft Dressing/Incision - clean, dry, no drainage Motor Function - intact, moving foot and toes well on exam.   Past Medical History:  Diagnosis Date  . Allergy   . Hernia of anterior abdominal wall   . Hyperlipidemia   . Hypertension   . Osteopenia 09/08/2017   Oct 2018; next scan => Sep 10, 2019    Assessment/Plan: 1 Day Post-Op Procedure(s)  (LRB): ARTHROPLASTY BIPOLAR HIP (HEMIARTHROPLASTY) (Left) Active Problems:   Closed left hip fracture (HCC)  Estimated body mass index is 20.8 kg/m as calculated from the following:   Height as of this encounter: 5\' 5"  (1.651 m).   Weight as of this encounter: 56.7 kg. Advance diet Up with therapy D/C IV fluids  Follow-up at [facility] clinic orthopedics in 2 weeks for staple removal and left hip x-rays.  DVT Prophylaxis - Lovenox, Foot Pumps and TED hose Weight-Bearing as tolerated to left leg  [facility], PA-C Orthopaedic Surgery 07/05/2020, 6:54 AM

## 2020-07-05 NOTE — Progress Notes (Signed)
PT Cancellation Note  Patient Details Name: Laura Deleon MRN: [id number] DOB: [birth year]   Cancelled Treatment:    Reason Eval/Treat Not Completed: Other (comment) (Pt eating breakfast at this time, PT to re-attempt as able.)  Olga Coaster PT, DPT 9:42 AM,07/05/20

## 2020-07-05 NOTE — Anesthesia Postprocedure Evaluation (Signed)
Anesthesia Post Note  Patient: DEANIE JUPITER  Procedure(s) Performed: ARTHROPLASTY BIPOLAR HIP (HEMIARTHROPLASTY) (Left Hip)  Patient location during evaluation: Nursing Unit Anesthesia Type: Spinal Level of consciousness: oriented and awake and alert Pain management: pain level controlled Vital Signs Assessment: post-procedure vital signs reviewed and stable Respiratory status: spontaneous breathing and respiratory function stable Cardiovascular status: blood pressure returned to baseline and stable Postop Assessment: no headache, no backache, no apparent nausea or vomiting and patient able to bend at knees Anesthetic complications: no   No complications documented.   Last Vitals:  Vitals:   07/04/20 2327 07/05/20 0755  BP: 107/70 131/73  Pulse: 62 80  Resp: 17 17  Temp: 36.7 C 36.7 C  SpO2: 97% 96%    Last Pain:  Vitals:   07/05/20 0755  TempSrc: Oral  PainSc:                  Elmarie Mainland

## 2020-07-06 LAB — CBC
HCT: 35 % — ABNORMAL LOW (ref 36.0–46.0)
Hemoglobin: 12 g/dL (ref 12.0–15.0)
MCH: 29.3 pg (ref 26.0–34.0)
MCHC: 34.3 g/dL (ref 30.0–36.0)
MCV: 85.6 fL (ref 80.0–100.0)
Platelets: 147 10*3/uL — ABNORMAL LOW (ref 150–400)
RBC: 4.09 MIL/uL (ref 3.87–5.11)
RDW: 13.4 % (ref 11.5–15.5)
WBC: 14.5 10*3/uL — ABNORMAL HIGH (ref 4.0–10.5)
nRBC: 0 % (ref 0.0–0.2)

## 2020-07-06 LAB — BASIC METABOLIC PANEL
Anion gap: 3 — ABNORMAL LOW (ref 5–15)
BUN: 8 mg/dL (ref 8–23)
CO2: 32 mmol/L (ref 22–32)
Calcium: 8.4 mg/dL — ABNORMAL LOW (ref 8.9–10.3)
Chloride: 98 mmol/L (ref 98–111)
Creatinine, Ser: 0.57 mg/dL (ref 0.44–1.00)
GFR calc Af Amer: >60 mL/min (ref >60)
GFR calc non Af Amer: >60 mL/min (ref >60)
Glucose, Bld: 119 mg/dL — ABNORMAL HIGH (ref 70–99)
Potassium: 4.2 mmol/L (ref 3.5–5.1)
Sodium: 133 mmol/L — ABNORMAL LOW (ref 135–145)

## 2020-07-06 LAB — GLUCOSE, CAPILLARY
Glucose-Capillary: 123 mg/dL — ABNORMAL HIGH (ref 70–99)
Glucose-Capillary: 139 mg/dL — ABNORMAL HIGH (ref 70–99)
Glucose-Capillary: 110 mg/dL — ABNORMAL HIGH (ref 70–99)

## 2020-07-06 MED ORDER — ACETAMINOPHEN 500 MG PO TABS
1000.0000 mg | ORAL_TABLET | Freq: Three times a day (TID) | ORAL | Status: DC
  Administered 2020-07-06 – 2020-07-09 (×9): 1000 mg via ORAL
  Filled 2020-07-06 (×9): qty 2

## 2020-07-06 MED ORDER — POLYETHYLENE GLYCOL 3350 17 G PO PACK
17.0000 g | PACK | Freq: Two times a day (BID) | ORAL | Status: DC | PRN
  Administered 2020-07-06: 17 g via ORAL
  Filled 2020-07-06: qty 1

## 2020-07-06 MED ORDER — SENNOSIDES-DOCUSATE SODIUM 8.6-50 MG PO TABS: 1.0000 | ORAL_TABLET | Freq: Two times a day (BID) | ORAL | Status: DC | PRN

## 2020-07-06 NOTE — Progress Notes (Signed)
PROGRESS NOTE  Laura Deleon AJO:[id number] DOB: Mar 18, 1946   PCP: Danelle Berry, PA-C  Patient is from: Home.  DOA: 07/03/2020 LOS: 3  Brief Narrative / Interim history: 74 year old female with history of HTN, osteopenia and dyslipidemia presenting after accidental fall and subsequent left hip pain and inability to ambulate.  He was found to have left femoral neck fracture with varus angulation.  Orthopedic surgery consulted.  Patient underwent left hip hemiarthroplasty Dr. Allena Katz on 07/04/2020.  Therapy recommended SNF.  Waiting on bed.  Subjective: Seen and examined earlier this morning.  No major events overnight of this morning.  She had a lot of pain when moved around to change bed sheets.  She is hesitant about taking opiates.  Has not had a bowel movement yet.  She denies chest pain, dyspnea, GI or UTI symptoms.  Daughter at bedside.  Objective: Vitals:   07/05/20 1608 07/06/20 0056 07/06/20 0227 07/06/20 0806  BP: 118/60 104/69 119/78 131/69  Pulse: 78 61 67 79  Resp: 18 18  16   Temp: 98.2 F (36.8 C) 98.7 F (37.1 C)  97.8 F (36.6 C)  TempSrc: Oral Oral    SpO2: 100% 97%  97%  Weight:      Height:        Intake/Output Summary (Last 24 hours) at 07/06/2020 1401 Last data filed at 07/05/2020 1830 Gross per 24 hour  Intake 518.02 ml  Output --  Net 518.02 ml   Filed Weights   07/03/20 2223 07/04/20 1149  Weight: 56.7 kg 56.7 kg    Examination:  GENERAL: No apparent distress.  Nontoxic. HEENT: MMM.  Vision and hearing grossly intact.  NECK: Supple.  No apparent JVD.  RESP: On RA.  No IWOB.  Fair aeration bilaterally. CVS:  RRR. Heart sounds normal.  ABD/GI/GU: BS+. Abd soft, NTND.  Reducible abdominal hernia over LLQ. MSK/EXT:  Moves extremities. No apparent deformity. No edema.  SKIN: Surgical dressing DCI. NEURO: Awake, alert and oriented appropriately.  No apparent focal neuro deficit. PSYCH: Calm. Normal affect.  Procedures:  8/25-left hip  hemiarthroplasty by Dr. 07/06/20  Microbiology summarized: COVID-19 PCR negative.  Assessment & Plan: Fall at home-accidental Left femoral neck fracture with varus angulation -Status post left hip hemiarthroplasty by Dr. Allena Katz on 8/25 -Pain control with scheduled Tylenol, as needed oxycodone and Dilaudid. -Bowel regimen-MiraLAX, Senokot-S and Dulcolax based on severity -Subcu Lovenox for VTE prophylaxis.  Also SCD while in-house -Discontinue IV fluid. -PT/OT eval  Essential hypertension: Hypotensive on admission.  Normotensive now. -Continue home metoprolol -Discontinue IV fluid.  Dyslipidemia -Continue home statin  Mild hyponatremia: Stable. -Continue IV NS  Hyperglycemia without diagnosis of diabetes: Likely stress-induced.  A1c 5.8%.  Hypokalemia-resolved.  Leukocytosis: Likely demargination. -Continue monitoring  Mild thrombocytopenia: Likely dilutional effect.  Too early for HIT. -Continue monitoring   Body mass index is 20.8 kg/m.         DVT prophylaxis:  enoxaparin (LOVENOX) injection 40 mg Start: 07/05/20 0800 SCDs Start: 07/04/20 1714  Code Status: Full code Family Communication: Updated patient's daughter at bedside. Status is: Inpatient  Remains inpatient appropriate because:Unsafe d/c plan   Dispo: The patient is from: Home              Anticipated d/c is to: SNF              Anticipated d/c date is: 1 day              Patient currently is medically stable to d/c.  Consultants:  Orthopedic surgery   Sch Meds:  Scheduled Meds: . acetaminophen  1,000 mg Oral Q8H  . aspirin  81 mg Oral Daily  . atorvastatin  10 mg Oral QODAY  . Chlorhexidine Gluconate Cloth  6 each Topical Daily  . docusate sodium  100 mg Oral BID  . enoxaparin (LOVENOX) injection  40 mg Subcutaneous Q24H  . ezetimibe  10 mg Oral Daily  . insulin aspart  0-9 Units Subcutaneous TID PC & HS  . metoprolol succinate  50 mg Oral Daily  . multivitamin with minerals   1 tablet Oral Daily   Continuous Infusions: . sodium chloride 75 mL/hr at 07/04/20 1822   PRN Meds:.bisacodyl, HYDROmorphone (DILAUDID) injection, labetalol, loratadine, magnesium hydroxide, menthol-cetylpyridinium **OR** phenol, metoCLOPramide **OR** metoCLOPramide (REGLAN) injection, ondansetron **OR** ondansetron (ZOFRAN) IV, oxyCODONE, oxyCODONE, polyethylene glycol, senna-docusate, traMADol, traZODone  Antimicrobials: Anti-infectives (From admission, onward)   Start     Dose/Rate Route Frequency Ordered Stop   07/04/20 2000  ceFAZolin (ANCEF) IVPB 1 g/50 mL premix        1 g 100 mL/hr over 30 Minutes Intravenous Every 6 hours 07/04/20 1713 07/05/20 0254   07/04/20 1244  ceFAZolin (ANCEF) 1-4 GM/50ML-% IVPB       Note to Pharmacy: Malva Limes   : cabinet override      07/04/20 1244 07/04/20 1405   07/04/20 1230  ceFAZolin (ANCEF) IVPB 1 g/50 mL premix        1 g 100 mL/hr over 30 Minutes Intravenous  Once 07/03/20 2334 07/04/20 1344       I have personally reviewed the following labs and images: CBC: Recent Labs  Lab 07/03/20 2220 07/04/20 0459 07/05/20 0504 07/06/20 0541  WBC 12.5* 14.2* 15.4* 14.5*  NEUTROABS 8.3*  --   --   --   HGB 14.4 13.6 12.2 12.0  HCT 43.6 40.5 35.7* 35.0*  MCV 87.7 86.9 87.5 85.6  PLT 219 195 166 147*   BMP &GFR Recent Labs  Lab 07/03/20 2220 07/04/20 0459 07/05/20 0504 07/06/20 0541  NA 136 133* 136 133*  K 3.3* 4.5 3.7 4.2  CL 99 98 103 98  CO2 27 28 26  32  GLUCOSE 202* 142* 137* 119*  BUN 12 9 8 8   CREATININE 0.54 0.65 0.52 0.57  CALCIUM 9.0 8.7* 8.5* 8.4*  MG  --   --  2.0  --    Estimated Creatinine Clearance: 56.9 mL/min (by C-G formula based on SCr of 0.57 mg/dL). Liver & Pancreas: Recent Labs  Lab 07/03/20 2220  AST 30  ALT 21  ALKPHOS 56  BILITOT 0.5  PROT 7.1  ALBUMIN 4.1   No results for input(s): LIPASE, AMYLASE in the last 168 hours. No results for input(s): AMMONIA in the last 168  hours. Diabetic: Recent Labs    07/03/20 2220  HGBA1C 5.8*   Recent Labs  Lab 07/05/20 1144 07/05/20 1641 07/05/20 2127 07/06/20 0806 07/06/20 1130  GLUCAP 125* 139* 132* 123* 139*   Cardiac Enzymes: No results for input(s): CKTOTAL, CKMB, CKMBINDEX, TROPONINI in the last 168 hours. No results for input(s): PROBNP in the last 8760 hours. Coagulation Profile: Recent Labs  Lab 07/03/20 2220  INR 1.0   Thyroid Function Tests: No results for input(s): TSH, T4TOTAL, FREET4, T3FREE, THYROIDAB in the last 72 hours. Lipid Profile: No results for input(s): CHOL, HDL, LDLCALC, TRIG, CHOLHDL, LDLDIRECT in the last 72 hours. Anemia Panel: No results for input(s): VITAMINB12, FOLATE, FERRITIN, TIBC, IRON, RETICCTPCT in  the last 72 hours. Urine analysis: No results found for: COLORURINE, APPEARANCEUR, LABSPEC, PHURINE, GLUCOSEU, HGBUR, BILIRUBINUR, KETONESUR, PROTEINUR, UROBILINOGEN, NITRITE, LEUKOCYTESUR Sepsis Labs: Invalid input(s): PROCALCITONIN, LACTICIDVEN  Microbiology: Recent Results (from the past 240 hour(s))  SARS Coronavirus 2 by RT PCR (hospital order, performed in [facility] hospital lab) Nasopharyngeal Nasopharyngeal Swab     Status: None   Collection Time: 07/03/20 10:28 PM   Specimen: Nasopharyngeal Swab  Result Value Ref Range Status   SARS Coronavirus 2 NEGATIVE NEGATIVE Final    Comment: (NOTE) SARS-CoV-2 target nucleic acids are NOT DETECTED.  The SARS-CoV-2 RNA is generally detectable in upper and lower respiratory specimens during the acute phase of infection. The lowest concentration of SARS-CoV-2 viral copies this assay can detect is 250 copies / mL. A negative result does not preclude SARS-CoV-2 infection and should not be used as the sole basis for treatment or other patient management decisions.  A negative result may occur with improper specimen collection / handling, submission of specimen other than nasopharyngeal swab, presence of viral  mutation(s) within the areas targeted by this assay, and inadequate number of viral copies (<250 copies / mL). A negative result must be combined with clinical observations, patient history, and epidemiological information.  Fact Sheet for Patients:   BoilerBrush.com.cy  Fact Sheet for Healthcare Providers: https://pope.com/  This test is not yet approved or  cleared by the [place] FDA and has been authorized for detection and/or diagnosis of SARS-CoV-2 by FDA under an Emergency Use Authorization (EUA).  This EUA will remain in effect (meaning this test can be used) for the duration of the COVID-19 declaration under Section 564(b)(1) of the Act, 21 U.S.C. section 360bbb-3(b)(1), unless the authorization is terminated or revoked sooner.  Performed at [facility], [address], [place], [place] [address]     Radiology Studies: No results found.   Tarri Guilfoil T. Sabrine Patchen [facility]  If 7PM-7AM, please contact night-coverage [email or web address] 07/06/2020, 2:01 PM

## 2020-07-06 NOTE — [place] FL2 (Signed)
[place] MEDICAID FL2 LEVEL OF CARE SCREENING TOOL     IDENTIFICATION  Patient Name: Laura Deleon Birthdate: September 20, 1946 Sex: female Admission Date (Current Location): 07/03/2020  [facility] and [place] Number:  Chiropodist and Address:  [facility], [address] [place], [place] [address]      Provider Number: [id number]  Attending Physician Name and Address:  Almon Hercules, MD  Relative Name and Phone Number:  [place] [phone]    Current Level of Care: Hospital Recommended Level of Care: Skilled Nursing Facility Prior Approval Number:    Date Approved/Denied:   PASRR Number: [id number]  Discharge Plan:      Current Diagnoses: Patient Active Problem List   Diagnosis Date Noted   Closed left hip fracture (HCC) 07/03/2020   Hx of transient ischemic attack (TIA) 02/17/2019   Peripheral neuropathy 08/17/2018   Contracture of joint of finger of right hand 11/11/2017   Osteopenia 09/08/2017   Gallstone 12/16/2016   Calcification of abdominal aorta (HCC) 12/16/2016   Abdominal wall hernia 07/19/2015   Allergic rhinitis, seasonal 07/19/2015   Blood glucose elevated 07/19/2015   Hypertension goal BP (blood pressure) < 140/90 07/19/2015   Pure hypercholesterolemia 07/19/2015   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   Hyperlipidemia LDL goal <70 07/19/2015    Orientation RESPIRATION BLADDER Height & Weight     Self, Time, Situation, Place  Normal Continent Weight: 56.7 kg Height:  5\' 5"  (165.1 cm)  BEHAVIORAL SYMPTOMS/MOOD NEUROLOGICAL BOWEL NUTRITION STATUS      Continent Diet (regular)  AMBULATORY STATUS COMMUNICATION OF NEEDS Skin   Extensive Assist Verbally Surgical wounds                       Personal Care Assistance Level of Assistance              Functional Limitations Info             SPECIAL CARE FACTORS FREQUENCY  PT (By licensed PT), OT (By licensed OT)     PT Frequency: 5 times per week OT  Frequency: 3 times per week            Contractures Contractures Info: Not present    Additional Factors Info  Code Status, Allergies Code Status Info: full code Allergies Info: sulfa antibiotics, latex           Current Medications (07/06/2020):  This is the current hospital active medication list Current Facility-Administered Medications  Medication Dose Route Frequency Provider Last Rate Last Admin   0.9 %  sodium chloride infusion   Intravenous Continuous 07/08/2020, MD 75 mL/hr at 07/04/20 1822 New Bag at 07/04/20 1822   aspirin chewable tablet 81 mg  81 mg Oral Daily 07/06/20, MD   81 mg at 07/06/20 07/08/20   atorvastatin (LIPITOR) tablet 10 mg  10 mg Oral 9381, MD   10 mg at 07/04/20 1823   bisacodyl (DULCOLAX) suppository 10 mg  10 mg Rectal Daily PRN 07/06/20, MD       Chlorhexidine Gluconate Cloth 2 % PADS 6 each  6 each Topical Daily Signa Kell, MD   6 each at 07/04/20 1816   docusate sodium (COLACE) capsule 100 mg  100 mg Oral BID 07/06/20, MD   100 mg at 07/06/20 0837   enoxaparin (LOVENOX) injection 40 mg  40 mg Subcutaneous Q24H 07/08/20, MD   40 mg at 07/06/20 [phone]  ezetimibe (ZETIA) tablet 10 mg  10 mg Oral Daily Signa Kell, MD   10 mg at 07/06/20 7340   HYDROmorphone (DILAUDID) injection 0.2-0.4 mg  0.2-0.4 mg Intravenous Q4H PRN Signa Kell, MD       insulin aspart (novoLOG) injection 0-9 Units  0-9 Units Subcutaneous TID PC & HS Signa Kell, MD   1 Units at 07/05/20 2132   labetalol (NORMODYNE) injection 20 mg  20 mg Intravenous Q3H PRN Signa Kell, MD       loratadine (CLARITIN) tablet 10 mg  10 mg Oral Daily PRN Signa Kell, MD       magnesium hydroxide (MILK OF MAGNESIA) suspension 30 mL  30 mL Oral Daily PRN Signa Kell, MD   30 mL at 07/05/20 3709   menthol-cetylpyridinium (CEPACOL) lozenge 3 mg  1 lozenge Oral PRN Signa Kell, MD       Or   phenol (CHLORASEPTIC) mouth spray 1 spray  1 spray  Mouth/Throat PRN Signa Kell, MD       metoCLOPramide (REGLAN) tablet 5-10 mg  5-10 mg Oral Q8H PRN Signa Kell, MD       Or   metoCLOPramide (REGLAN) injection 5-10 mg  5-10 mg Intravenous Q8H PRN Signa Kell, MD       metoprolol succinate (TOPROL-XL) 24 hr tablet 50 mg  50 mg Oral Daily Signa Kell, MD   50 mg at 07/06/20 6438   multivitamin with minerals tablet 1 tablet  1 tablet Oral Daily Signa Kell, MD   1 tablet at 07/06/20 0837   ondansetron (ZOFRAN) tablet 4 mg  4 mg Oral Q6H PRN Signa Kell, MD       Or   ondansetron [facility]) injection 4 mg  4 mg Intravenous Q6H PRN Signa Kell, MD   4 mg at 07/05/20 3818   oxyCODONE (Oxy IR/ROXICODONE) immediate release tablet 2.5-5 mg  2.5-5 mg Oral Q4H PRN Signa Kell, MD   5 mg at 07/05/20 4037   oxyCODONE (Oxy IR/ROXICODONE) immediate release tablet 5-10 mg  5-10 mg Oral Q4H PRN Signa Kell, MD       senna-docusate (Senokot-S) tablet 1 tablet  1 tablet Oral QHS PRN Signa Kell, MD       traMADol Janean Sark) tablet 50 mg  50 mg Oral Q6H PRN Signa Kell, MD   50 mg at 07/06/20 5436   traZODone (DESYREL) tablet 25 mg  25 mg Oral QHS PRN Signa Kell, MD         Discharge Medications: Please see discharge summary for a list of discharge medications.  Relevant Imaging Results:  Relevant Lab Results:   Additional Information SS# [id number]  Barrie Dunker, RN

## 2020-07-06 NOTE — TOC Progression Note (Signed)
Transition of Care [facility]) - Progression Note    Patient Details  Name: Laura Deleon MRN: [id number] Date of Birth: 1946/03/03  [facility]) CM/SW Contact  Barrie Dunker, RN Phone Number: 07/06/2020, 11:04 AM  Clinical Narrative:   Spoke with the patient and her family in the room, reviewed the bed offers, She has accepted the Bed offer for [facility], I called [facility]TA to start the insurance process, she has not had the Vaccines         Expected Discharge Plan and Services                                                 Social Determinants of Health (SDOH) Interventions    Readmission Risk Interventions No flowsheet data found.

## 2020-07-06 NOTE — Progress Notes (Signed)
Physical Therapy Treatment Patient Details Name: Laura Deleon MRN: [id number] DOB: 1946-06-28 Today's Date: 07/06/2020    History of Present Illness Pt is a 74 year old female with history of HTN, osteopenia and dyslipidemia presenting after accidental fall and subsequent left hip pain and inability to ambulate.  underwent L hip hemiarthroplasty 8/25. Posterior hip precautions, WBAT.    PT Comments    Pt alert, oriented, reported she had a very hard night because of pain, currently 3/10 at rest, premedicated prior to PT session. With motivation, pt agreeable to exercise/mobilize. Supine to sit with modA, improved initial sitting balance compared to previous session, CGA for safety. Sit <> stand with modA and RW, cueing for sequencing. She was able to take a few steps towards recliner in room, minA. Throughout and verbal/tactile cueing. Pt up in chair, all needs in reach, family at bedside. The patient would benefit from further skilled PT intervention to continue to progress towards goals. Recommendation remains appropriate.     Follow Up Recommendations  SNF     Equipment Recommendations  Rolling walker with 5" wheels;3in1 (PT)    Recommendations for Other Services       Precautions / Restrictions Precautions Precautions: Fall;Posterior Hip Precaution Booklet Issued: Yes (comment) Restrictions Weight Bearing Restrictions: Yes LLE Weight Bearing: Weight bearing as tolerated    Mobility  Bed Mobility Overal bed mobility: Needs Assistance Bed Mobility: Supine to Sit     Supine to sit: Mod assist     General bed mobility comments: extensive cueing to maximize pt participation  Transfers Overall transfer level: Needs assistance Equipment used: Rolling walker (2 wheeled) Transfers: Sit to/from Stand Sit to Stand: Mod assist            Ambulation/Gait Ambulation/Gait assistance: Min assist Gait Distance (Feet): 2 Feet Assistive device: Rolling walker (2 wheeled)        General Gait Details: pt still with difficulty weight bearing through LLE, preferred to hop/TTWB on LLE. minA throughout especially with weight shift   Stairs             Wheelchair Mobility    Modified Rankin (Stroke Patients Only)       Balance Overall balance assessment: Needs assistance Sitting-balance support: Feet supported Sitting balance-Leahy Scale: Fair       Standing balance-Leahy Scale: Poor Standing balance comment: reliant on physical assist and RW                            Cognition Arousal/Alertness: Awake/alert Behavior During Therapy: WFL for tasks assessed/performed Overall Cognitive Status: Within Functional Limits for tasks assessed                                        Exercises General Exercises - Lower Extremity Ankle Circles/Pumps: AROM;Both;15 reps Long Arc Quad: AROM;Strengthening;Left;15 reps Other Exercises Other Exercises: Pt able to recall 2/3 hip precautions    General Comments        Pertinent Vitals/Pain Pain Assessment: 0-10 Pain Score: 3  Pain Location: L hip/low back at rest Pain Descriptors / Indicators: Aching;Tender Pain Intervention(s): Limited activity within patient's tolerance;Monitored during session;Premedicated before session;Repositioned    Home Living                      Prior Function  PT Goals (current goals can now be found in the care plan section) Progress towards PT goals: Progressing toward goals    Frequency    BID      PT Plan Current plan remains appropriate    Co-evaluation              AM-PAC PT "6 Clicks" Mobility   Outcome Measure  Help needed turning from your back to your side while in a flat bed without using bedrails?: A Lot Help needed moving from lying on your back to sitting on the side of a flat bed without using bedrails?: A Lot Help needed moving to and from a bed to a chair (including a wheelchair)?: A  Lot Help needed standing up from a chair using your arms (e.g., wheelchair or bedside chair)?: A Lot Help needed to walk in hospital room?: A Lot Help needed climbing 3-5 steps with a railing? : Total 6 Click Score: 11    End of Session Equipment Utilized During Treatment: Gait belt Activity Tolerance: Patient limited by pain Patient left: in chair;with family/visitor present;with SCD's reapplied;with call bell/phone within reach;with chair alarm set;with nursing/sitter in room Nurse Communication: Mobility status PT Visit Diagnosis: Other abnormalities of gait and mobility (R26.89);Pain;Difficulty in walking, not elsewhere classified (R26.2) Pain - Right/Left: Left Pain - part of body: Hip     Time: 5397-6734 PT Time Calculation (min) (ACUTE ONLY): 25 min  Charges:  $Therapeutic Exercise: 23-37 mins                     Olga Coaster PT, DPT 11:52 AM,07/06/20

## 2020-07-06 NOTE — Progress Notes (Signed)
Physical Therapy Treatment Patient Details Name: Laura Deleon MRN: [id number] DOB: Jan 01, 1946 Today's Date: 07/06/2020    History of Present Illness Pt is a 74 year old female with history of HTN, osteopenia and dyslipidemia presenting after accidental fall and subsequent left hip pain and inability to ambulate.  underwent L hip hemiarthroplasty 8/25. Posterior hip precautions, WBAT.    PT Comments    Pt was sitting in recliner upon arriving. She agrees to PT session and reports pain is much better since receiving pain meds. Pt reports 3/10 pain in sitting that elevated to 8/10 pain with wt bearing. Was able to recall 3/3 precautions. She was able to stand with min assist and ambulate ~ 8 ft with RW without LOB or unsteadiness however pt has difficulty getting foot flat on floor 2/2 to pain. Pt was sitting on [facility] and requested therapist return later to give her more time to have BM. RN was in room. PT will return next date to progress pt towards acute goals.    Follow Up Recommendations  SNF     Equipment Recommendations  Rolling walker with 5" wheels;3in1 (PT)    Recommendations for Other Services       Precautions / Restrictions Precautions Precautions: Fall;Posterior Hip Precaution Booklet Issued: Yes (comment) Restrictions Weight Bearing Restrictions: Yes RLE Weight Bearing: Weight bearing as tolerated    Mobility  Bed Mobility               General bed mobility comments: not formally tested. pt was in recliner pre session and on [facility] with RN present post session.   Transfers Overall transfer level: Needs assistance Equipment used: Rolling walker (2 wheeled) Transfers: Sit to/from Stand Sit to Stand: Min assist         General transfer comment: Min assist + moderate vcs for precautions, hand placement, and technique  Ambulation/Gait Ambulation/Gait assistance: Min assist Gait Distance (Feet): 8 Feet Assistive device: Rolling walker (2 wheeled) Gait  Pattern/deviations: Step-to pattern;Antalgic;Trunk flexed Gait velocity: decreased   General Gait Details: Pt was able to ambulate ~ 8 ft to RW. WBAT but pt uses more of a TTWB pattern vcs for attempting to get foot flat on floor   Stairs             Wheelchair Mobility    Modified Rankin (Stroke Patients Only)       Balance Overall balance assessment: Needs assistance Sitting-balance support: Feet supported Sitting balance-Leahy Scale: Good Sitting balance - Comments: no LOB in sitting    Standing balance support: Bilateral upper extremity supported Standing balance-Leahy Scale: Fair Standing balance comment: heavy reliance on RW to maintain standing balance.                            Cognition Arousal/Alertness: Awake/alert Behavior During Therapy: WFL for tasks assessed/performed Overall Cognitive Status: Within Functional Limits for tasks assessed                                 General Comments: Pt is alert and agreeable to to session however requesting to use [facility]      Exercises      General Comments        Pertinent Vitals/Pain Pain Assessment: 0-10 Pain Score: 3  (at rest 8/10 in wt bearing) Pain Descriptors / Indicators: Aching;Tender Pain Intervention(s): Limited activity within patient's tolerance;Monitored during session;Premedicated before session;Repositioned  Home Living                      Prior Function            PT Goals (current goals can now be found in the care plan section) Acute Rehab PT Goals Patient Stated Goal: to go to rehab and get back to yardwork Progress towards PT goals: Progressing toward goals    Frequency    BID      PT Plan Current plan remains appropriate    Co-evaluation              AM-PAC PT "6 Clicks" Mobility   Outcome Measure  Help needed turning from your back to your side while in a flat bed without using bedrails?: A Lot Help needed moving from  lying on your back to sitting on the side of a flat bed without using bedrails?: A Lot Help needed moving to and from a bed to a chair (including a wheelchair)?: A Lot Help needed standing up from a chair using your arms (e.g., wheelchair or bedside chair)?: A Lot Help needed to walk in hospital room?: A Lot Help needed climbing 3-5 steps with a railing? : Total 6 Click Score: 11    End of Session Equipment Utilized During Treatment: Gait belt Activity Tolerance: Patient tolerated treatment well Patient left: with call bell/phone within reach (on [facility] ) Nurse Communication: Mobility status;Precautions PT Visit Diagnosis: Other abnormalities of gait and mobility (R26.89);Pain;Difficulty in walking, not elsewhere classified (R26.2) Pain - Right/Left: Left Pain - part of body: Hip     Time: 8250-5397 PT Time Calculation (min) (ACUTE ONLY): 10 min  Charges:  $Therapeutic Activity: 8-22 mins                    Jetta Lout PTA 07/06/20, 4:48 PM

## 2020-07-06 NOTE — Progress Notes (Signed)
PT Cancellation Note  Patient Details Name: Laura Deleon MRN: [id number] DOB: Nov 01, 1946   Cancelled Treatment:    Reason Eval/Treat Not Completed: Other (comment) (Pt currently recieving bath, also requested to wait for PT due to increased pain this AM. PT to follow up as able.)   Olga Coaster PT, DPT 8:49 AM,07/06/20

## 2020-07-06 NOTE — Progress Notes (Signed)
  Subjective: 2 Days Post-Op Procedure(s) (LRB): ARTHROPLASTY BIPOLAR HIP (HEMIARTHROPLASTY) (Left) Patient reports pain as moderate.   Patient is well, and has had no acute complaints or problems Plan is to go Rehab after hospital stay. Negative for chest pain and shortness of breath Fever: no Gastrointestinal: Negative for nausea and vomiting  Objective: Vital signs in last 24 hours: Temp:  [97.8 F (36.6 C)-98.7 F (37.1 C)] 98.7 F (37.1 C) (08/27 0056) Pulse Rate:  [61-80] 67 (08/27 0227) Resp:  [17-18] 18 (08/27 0056) BP: (104-131)/(60-78) 119/78 (08/27 0227) SpO2:  [96 %-100 %] 97 % (08/27 0056)  Intake/Output from previous day:  Intake/Output Summary (Last 24 hours) at 07/06/2020 0631 Last data filed at 07/05/2020 1830 Gross per 24 hour  Intake 758.02 ml  Output --  Net 758.02 ml    Intake/Output this shift: No intake/output data recorded.  Labs: Recent Labs    07/03/20 2220 07/04/20 0459 07/05/20 0504 07/06/20 0541  HGB 14.4 13.6 12.2 12.0   Recent Labs    07/05/20 0504 07/06/20 0541  WBC 15.4* 14.5*  RBC 4.08 4.09  HCT 35.7* 35.0*  PLT 166 147*   Recent Labs    07/05/20 0504 07/06/20 0541  NA 136 133*  K 3.7 4.2  CL 103 98  CO2 26 32  BUN 8 8  CREATININE 0.52 0.57  GLUCOSE 137* 119*  CALCIUM 8.5* 8.4*   Recent Labs    07/03/20 2220  INR 1.0     EXAM General - Patient is Alert and Oriented Extremity - Neurovascular intact Sensation intact distally Dorsiflexion/Plantar flexion intact Compartment soft Dressing/Incision - clean, dry, no drainage Motor Function - intact, moving foot and toes well on exam.   Past Medical History:  Diagnosis Date  . Allergy   . Hernia of anterior abdominal wall   . Hyperlipidemia   . Hypertension   . Osteopenia 09/08/2017   Oct 2018; next scan => Sep 10, 2019    Assessment/Plan: 2 Days Post-Op Procedure(s) (LRB): ARTHROPLASTY BIPOLAR HIP (HEMIARTHROPLASTY) (Left) Active Problems:   Closed  left hip fracture (HCC)  Estimated body mass index is 20.8 kg/m as calculated from the following:   Height as of this encounter: 5\' 5"  (1.651 m).   Weight as of this encounter: 56.7 kg. Advance diet Up with therapy D/C IV fluids  Follow-up at [facility] clinic orthopedics in 2 weeks for staple removal and left hip x-rays. Discharge to rehab when cleared by medicine  DVT Prophylaxis - Lovenox, Foot Pumps and TED hose Weight-Bearing as tolerated to left leg  [facility], PA-C Orthopaedic Surgery 07/06/2020, 6:31 AM

## 2020-07-06 NOTE — TOC Progression Note (Signed)
Transition of Care [facility]) - Progression Note    Patient Details  Name: Laura Deleon MRN: [id number] Date of Birth: 04-Sep-1946  [facility]) CM/SW Contact  Barrie Dunker, RN Phone Number: 07/06/2020, 8:52 AM  Clinical Narrative:   [place] completed, PASSR completed, Bedsearch sent, will review bed offers once obtained         Expected Discharge Plan and Services                                                 Social Determinants of Health (SDOH) Interventions    Readmission Risk Interventions No flowsheet data found.

## 2020-07-06 NOTE — Care Management Important Message (Signed)
Important Message  Patient Details  Name: CAROLLEE NUSSBAUMER MRN: [id number] Date of Birth: 30-Dec-1945   Medicare Important Message Given:  Yes     [name] 07/06/2020, 12:26 PM

## 2020-07-06 NOTE — Progress Notes (Signed)
Received pt with stable VS. PT worked with pt and was able to get her out of bed after pain med was given. Pending for SNF placement.

## 2020-07-07 LAB — GLUCOSE, CAPILLARY
Glucose-Capillary: 128 mg/dL — ABNORMAL HIGH (ref 70–99)
Glucose-Capillary: 96 mg/dL (ref 70–99)
Glucose-Capillary: 166 mg/dL — ABNORMAL HIGH (ref 70–99)
Glucose-Capillary: 88 mg/dL (ref 70–99)
Glucose-Capillary: 106 mg/dL — ABNORMAL HIGH (ref 70–99)

## 2020-07-07 NOTE — Progress Notes (Signed)
PROGRESS NOTE  Laura Deleon:[id number] DOB: 1946-03-17   PCP: Danelle Berry, PA-C  Patient is from: Home.  DOA: 07/03/2020 LOS: 4  Brief Narrative / Interim history: 74 year old female with history of HTN, osteopenia and dyslipidemia presenting after accidental fall and subsequent left hip pain and inability to ambulate.  He was found to have left femoral neck fracture with varus angulation.  Orthopedic surgery consulted.  Patient underwent left hip hemiarthroplasty Dr. Allena Katz on 07/04/2020.  Therapy recommended SNF.  Waiting on bed.  Subjective: Seen and examined earlier this morning.  No major events overnight of this morning.  Pain fairly controlled.  Denies chest pain, dyspnea, GI or UTI symptoms.  She had small bowel movement yesterday.  Objective: Vitals:   07/06/20 0806 07/06/20 1551 07/06/20 2327 07/07/20 0804  BP: 131/69 (!) 100/58 (!) 105/59 136/73  Pulse: 79 77 71 78  Resp: 16 17 19 16   Temp: 97.8 F (36.6 C) 97.8 F (36.6 C) 99.3 F (37.4 C) 98.4 F (36.9 C)  TempSrc:   Oral Oral  SpO2: 97% 99% 98% 98%  Weight:      Height:       No intake or output data in the 24 hours ending 07/07/20 1359 Filed Weights   07/03/20 2223 07/04/20 1149  Weight: 56.7 kg 56.7 kg    Examination:  GENERAL: No apparent distress.  Nontoxic. HEENT: MMM.  Vision and hearing grossly intact.  NECK: Supple.  No apparent JVD.  RESP: On RA.  No IWOB.  Fair aeration bilaterally. CVS:  RRR. Heart sounds normal.  ABD/GI/GU: BS+. Abd soft, NTND.  Reducible abdominal hernia over LLQ. MSK/EXT:  Moves extremities. No apparent deformity. No edema.  SKIN: Surgical dressing DCI. NEURO: Awake, alert and oriented appropriately.  No apparent focal neuro deficit. PSYCH: Calm. Normal affect.  Procedures:  8/25-left hip hemiarthroplasty by Dr. 07/06/20  Microbiology summarized: COVID-19 PCR negative.  Assessment & Plan: Fall at home-accidental Left femoral neck fracture with varus  angulation -Status post left hip hemiarthroplasty by Dr. Allena Katz on 8/25 -Pain control with scheduled Tylenol, as needed oxycodone and Dilaudid. -Bowel regimen-MiraLAX, Senokot-S and Dulcolax based on severity -Subcu Lovenox for 4 weeks for VTE prophylaxis.  Also SCD while in-house -Discontinue IV fluid. -PT/OT eval  Essential hypertension: Hypotensive on admission.  Normotensive now. -Continue home metoprolol  Dyslipidemia -Continue home statin  Mild hyponatremia: Stable.  Hyperglycemia without diagnosis of diabetes: Likely stress-induced.  A1c 5.8%.  Hypokalemia-resolved.  Leukocytosis: Likely demargination. -Continue monitoring  Mild thrombocytopenia: Likely dilutional effect.  Too early for HIT. -Continue monitoring   Body mass index is 20.8 kg/m.         DVT prophylaxis:  enoxaparin (LOVENOX) injection 40 mg Start: 07/05/20 0800 SCDs Start: 07/04/20 1714  Code Status: Full code Family Communication: Updated patient's daughter at bedside. Status is: Inpatient  Remains inpatient appropriate because:Unsafe d/c plan   Dispo: The patient is from: Home              Anticipated d/c is to: SNF              Anticipated d/c date is: 1 day              Patient currently is medically stable to d/c.       Consultants:  Orthopedic surgery   Sch Meds:  Scheduled Meds: . acetaminophen  1,000 mg Oral Q8H  . aspirin  81 mg Oral Daily  . atorvastatin  10 mg Oral QODAY  .  Chlorhexidine Gluconate Cloth  6 each Topical Daily  . docusate sodium  100 mg Oral BID  . enoxaparin (LOVENOX) injection  40 mg Subcutaneous Q24H  . ezetimibe  10 mg Oral Daily  . insulin aspart  0-9 Units Subcutaneous TID PC & HS  . metoprolol succinate  50 mg Oral Daily  . multivitamin with minerals  1 tablet Oral Daily   Continuous Infusions:  PRN Meds:.bisacodyl, HYDROmorphone (DILAUDID) injection, labetalol, loratadine, magnesium hydroxide, menthol-cetylpyridinium **OR** phenol,  metoCLOPramide **OR** metoCLOPramide (REGLAN) injection, ondansetron **OR** ondansetron (ZOFRAN) IV, oxyCODONE, oxyCODONE, polyethylene glycol, senna-docusate, traMADol, traZODone  Antimicrobials: Anti-infectives (From admission, onward)   Start     Dose/Rate Route Frequency Ordered Stop   07/04/20 2000  ceFAZolin (ANCEF) IVPB 1 g/50 mL premix        1 g 100 mL/hr over 30 Minutes Intravenous Every 6 hours 07/04/20 1713 07/05/20 0254   07/04/20 1244  ceFAZolin (ANCEF) 1-4 GM/50ML-% IVPB       Note to Pharmacy: Malva Limes   : cabinet override      07/04/20 1244 07/04/20 1405   07/04/20 1230  ceFAZolin (ANCEF) IVPB 1 g/50 mL premix        1 g 100 mL/hr over 30 Minutes Intravenous  Once 07/03/20 2334 07/04/20 1344       I have personally reviewed the following labs and images: CBC: Recent Labs  Lab 07/03/20 2220 07/04/20 0459 07/05/20 0504 07/06/20 0541  WBC 12.5* 14.2* 15.4* 14.5*  NEUTROABS 8.3*  --   --   --   HGB 14.4 13.6 12.2 12.0  HCT 43.6 40.5 35.7* 35.0*  MCV 87.7 86.9 87.5 85.6  PLT 219 195 166 147*   BMP &GFR Recent Labs  Lab 07/03/20 2220 07/04/20 0459 07/05/20 0504 07/06/20 0541  NA 136 133* 136 133*  K 3.3* 4.5 3.7 4.2  CL 99 98 103 98  CO2 27 28 26  32  GLUCOSE 202* 142* 137* 119*  BUN 12 9 8 8   CREATININE 0.54 0.65 0.52 0.57  CALCIUM 9.0 8.7* 8.5* 8.4*  MG  --   --  2.0  --    Estimated Creatinine Clearance: 56.9 mL/min (by C-G formula based on SCr of 0.57 mg/dL). Liver & Pancreas: Recent Labs  Lab 07/03/20 2220  AST 30  ALT 21  ALKPHOS 56  BILITOT 0.5  PROT 7.1  ALBUMIN 4.1   No results for input(s): LIPASE, AMYLASE in the last 168 hours. No results for input(s): AMMONIA in the last 168 hours. Diabetic: No results for input(s): HGBA1C in the last 72 hours. Recent Labs  Lab 07/06/20 1130 07/06/20 1713 07/06/20 2116 07/07/20 0808 07/07/20 1144  GLUCAP 139* 110* 128* 96 166*   Cardiac Enzymes: No results for input(s): CKTOTAL,  CKMB, CKMBINDEX, TROPONINI in the last 168 hours. No results for input(s): PROBNP in the last 8760 hours. Coagulation Profile: Recent Labs  Lab 07/03/20 2220  INR 1.0   Thyroid Function Tests: No results for input(s): TSH, T4TOTAL, FREET4, T3FREE, THYROIDAB in the last 72 hours. Lipid Profile: No results for input(s): CHOL, HDL, LDLCALC, TRIG, CHOLHDL, LDLDIRECT in the last 72 hours. Anemia Panel: No results for input(s): VITAMINB12, FOLATE, FERRITIN, TIBC, IRON, RETICCTPCT in the last 72 hours. Urine analysis: No results found for: COLORURINE, APPEARANCEUR, LABSPEC, PHURINE, GLUCOSEU, HGBUR, BILIRUBINUR, KETONESUR, PROTEINUR, UROBILINOGEN, NITRITE, LEUKOCYTESUR Sepsis Labs: Invalid input(s): PROCALCITONIN, LACTICIDVEN  Microbiology: Recent Results (from the past 240 hour(s))  SARS Coronavirus 2 by RT PCR (hospital order, performed  in [facility] hospital lab) Nasopharyngeal Nasopharyngeal Swab     Status: None   Collection Time: 07/03/20 10:28 PM   Specimen: Nasopharyngeal Swab  Result Value Ref Range Status   SARS Coronavirus 2 NEGATIVE NEGATIVE Final    Comment: (NOTE) SARS-CoV-2 target nucleic acids are NOT DETECTED.  The SARS-CoV-2 RNA is generally detectable in upper and lower respiratory specimens during the acute phase of infection. The lowest concentration of SARS-CoV-2 viral copies this assay can detect is 250 copies / mL. A negative result does not preclude SARS-CoV-2 infection and should not be used as the sole basis for treatment or other patient management decisions.  A negative result may occur with improper specimen collection / handling, submission of specimen other than nasopharyngeal swab, presence of viral mutation(s) within the areas targeted by this assay, and inadequate number of viral copies (<250 copies / mL). A negative result must be combined with clinical observations, patient history, and epidemiological information.  Fact Sheet for Patients:    BoilerBrush.com.cy  Fact Sheet for Healthcare Providers: https://pope.com/  This test is not yet approved or  cleared by the [place] FDA and has been authorized for detection and/or diagnosis of SARS-CoV-2 by FDA under an Emergency Use Authorization (EUA).  This EUA will remain in effect (meaning this test can be used) for the duration of the COVID-19 declaration under Section 564(b)(1) of the Act, 21 U.S.C. section 360bbb-3(b)(1), unless the authorization is terminated or revoked sooner.  Performed at [facility], [address], [place], [place] [address]     Radiology Studies: No results found.   Deetra Booton T. Chanci Ojala [facility]  If 7PM-7AM, please contact night-coverage [email or web address] 07/07/2020, 1:59 PM

## 2020-07-07 NOTE — Progress Notes (Signed)
Physical Therapy Treatment Patient Details Name: Laura Deleon MRN: [id number] DOB: [birth year] Today's Date: 07/07/2020    History of Present Illness Pt is a 74 year old female with history of HTN, osteopenia and dyslipidemia presenting after accidental fall and subsequent left hip pain and inability to ambulate.  underwent L hip hemiarthroplasty 8/25. Posterior hip precautions, WBAT.    PT Comments    Pt was sitting in recliner upon arriving. Agrees to PT session and is cooperative throughout. She was able to stand and ambulate one lap in hallway with RW + CGA. Overall progressing well. She did not have LOB or unsteadiness. Once returned to room, pt perform exercise handout. See exercises performed below. Pt does not feel she can safely manage at home alone. Will benefit from STR at DC to address deficits and continue to progress towards PLOF. Acute PT will continue to follow per POC.    Follow Up Recommendations  SNF;Supervision for mobility/OOB;Supervision/Assistance - 24 hour;Other (comment) (pt does not feel she can safely manage at home I'ly)     Equipment Recommendations  Rolling walker with 5" wheels;3in1 (PT)    Recommendations for Other Services       Precautions / Restrictions Precautions Precautions: Fall;Posterior Hip Precaution Booklet Issued: Yes (comment) Restrictions Weight Bearing Restrictions: Yes LLE Weight Bearing: Weight bearing as tolerated    Mobility  Bed Mobility               General bed mobility comments: in recliner pre/post session  Transfers Overall transfer level: Needs assistance Equipment used: Rolling walker (2 wheeled) Transfers: Sit to/from Stand Sit to Stand: Min guard         General transfer comment: CGA for safety   Ambulation/Gait Ambulation/Gait assistance: Min guard Gait Distance (Feet): 200 Feet Assistive device: Rolling walker (2 wheeled) Gait Pattern/deviations: Step-through pattern;Trunk flexed Gait velocity:  decreased   General Gait Details: Pt ambulated one lap around RN station with increased time but no LOB or unsteadiness        Balance Overall balance assessment: Needs assistance Sitting-balance support: Feet supported Sitting balance-Leahy Scale: Good Sitting balance - Comments: no LOB in sitting    Standing balance support: Bilateral upper extremity supported Standing balance-Leahy Scale: Good Standing balance comment: no LOB in standing. static standing was able to let go of RW       Cognition Arousal/Alertness: Awake/alert Behavior During Therapy: [facility] for tasks assessed/performed Overall Cognitive Status: Within Functional Limits for tasks assessed      General Comments: pt was able to recall 3/3 hip precautions      Exercises Total Joint Exercises Ankle Circles/Pumps: AROM;10 reps Quad Sets: AROM;10 reps Gluteal Sets: AROM;10 reps Towel Squeeze: AROM;10 reps Short Arc Quad: AROM;10 reps Heel Slides: AROM;10 reps Hip ABduction/ADduction: AROM;10 reps Straight Leg Raises: AAROM;10 reps     Pertinent Vitals/Pain Pain Assessment: No/denies pain Pain Score: 0-No pain Pain Intervention(s): Monitored during session;Limited activity within patient's tolerance;Premedicated before session;Repositioned;Ice applied    Home Living   Prior Function            PT Goals (current goals can now be found in the care plan section) Acute Rehab PT Goals Patient Stated Goal: to go to rehab and get back to yardwork Progress towards PT goals: Progressing toward goals    Frequency    BID      PT Plan Current plan remains appropriate    Co-evaluation              AM-PAC  PT "6 Clicks" Mobility   Outcome Measure  Help needed turning from your back to your side while in a flat bed without using bedrails?: A Little Help needed moving from lying on your back to sitting on the side of a flat bed without using bedrails?: A Little Help needed moving to and from a bed  to a chair (including a wheelchair)?: A Little Help needed standing up from a chair using your arms (e.g., wheelchair or bedside chair)?: A Little Help needed to walk in hospital room?: A Little Help needed climbing 3-5 steps with a railing? : A Little 6 Click Score: 18    End of Session Equipment Utilized During Treatment: Gait belt Activity Tolerance: Patient tolerated treatment well Patient left: in chair;with call bell/phone within reach;with chair alarm set Nurse Communication: Mobility status;Precautions PT Visit Diagnosis: Other abnormalities of gait and mobility (R26.89);Pain;Difficulty in walking, not elsewhere classified (R26.2) Pain - Right/Left: Left Pain - part of body: Hip     Time: 2025-4270 PT Time Calculation (min) (ACUTE ONLY): 28 min  Charges:  $Gait Training: 8-22 mins $Therapeutic Exercise: 8-22 mins                     Jetta Lout PTA 07/07/20, 4:57 PM

## 2020-07-07 NOTE — Progress Notes (Signed)
Physical Therapy Treatment Patient Details Name: Laura Deleon MRN: [id number] DOB: Aug 19, 1946 Today's Date: 07/07/2020    History of Present Illness Pt is a 74 year old female with history of HTN, osteopenia and dyslipidemia presenting after accidental fall and subsequent left hip pain and inability to ambulate.  underwent L hip hemiarthroplasty 8/25. Posterior hip precautions, WBAT.    PT Comments    Pt was long sitting in bed with hip abduction pillow in place. She agrees to PT session and is cooperative. Pt slightly anxious throughout session and does not feel she is doing well even though she is progressing well towards PT goals. She required min assist to exit bed. Stood to [employer] and was able to ambulate 200 ft with CGA. She requires more assistance with bed mobility and transfers than with ambulation. Pt lives home alone and will benefit form SNF at DC to address deficits and improve safety. At conclusion of session, pt was sitting in recliner with call bell in reach and chair alarm in place. Pillows placed to prevent legs from crossing.      Follow Up Recommendations  SNF     Equipment Recommendations  Rolling walker with 5" wheels;3in1 (PT)    Recommendations for Other Services       Precautions / Restrictions Precautions Precautions: Fall;Posterior Hip Precaution Booklet Issued: Yes (comment) Restrictions Weight Bearing Restrictions: Yes LLE Weight Bearing: Weight bearing as tolerated    Mobility  Bed Mobility Overal bed mobility: Needs Assistance Bed Mobility: Supine to Sit     Supine to sit: Min assist;HOB elevated     General bed mobility comments: increased time and vcs for technique,sequencing, and saftey  Transfers Overall transfer level: Needs assistance Equipment used: Rolling walker (2 wheeled) Transfers: Sit to/from Stand Sit to Stand: Min assist         General transfer comment: Min assist for safety to STS from elevated bed height and BSC. Vcs for  technique to adhere to precautions and improve safety  Ambulation/Gait Ambulation/Gait assistance: Min guard Gait Distance (Feet): 200 Feet Assistive device: Rolling walker (2 wheeled) Gait Pattern/deviations: Step-to pattern;Step-through pattern Gait velocity: decreased   General Gait Details: pt started with slow step to pattern but was able to advance to step through pattern by the end. She does have poor gait posture.    Stairs             Wheelchair Mobility    Modified Rankin (Stroke Patients Only)       Balance Overall balance assessment: Needs assistance Sitting-balance support: Feet supported Sitting balance-Leahy Scale: Good Sitting balance - Comments: no LOB in sitting    Standing balance support: Bilateral upper extremity supported Standing balance-Leahy Scale: Good Standing balance comment: heavy reliance on RW to maintain standing balance.                            Cognition Arousal/Alertness: Awake/alert Behavior During Therapy: WFL for tasks assessed/performed Overall Cognitive Status: Within Functional Limits for tasks assessed                                 General Comments: Pt is A and O x 4 but only able to recall 2/3 hip precautions. reminded of no to cross legs      Exercises Other Exercises Other Exercises: reviewe dhandout with pt    General Comments  Pertinent Vitals/Pain Pain Assessment: 0-10 Pain Score: 6  Pain Location: L hip/low back at rest Pain Descriptors / Indicators: Sore Pain Intervention(s): Limited activity within patient's tolerance;Monitored during session;Premedicated before session;Repositioned    Home Living                      Prior Function            PT Goals (current goals can now be found in the care plan section) Acute Rehab PT Goals Patient Stated Goal: to go to rehab and get back to yardwork Progress towards PT goals: Progressing toward goals     Frequency    BID      PT Plan Current plan remains appropriate    Co-evaluation              AM-PAC PT "6 Clicks" Mobility   Outcome Measure  Help needed turning from your back to your side while in a flat bed without using bedrails?: A Little Help needed moving from lying on your back to sitting on the side of a flat bed without using bedrails?: A Lot Help needed moving to and from a bed to a chair (including a wheelchair)?: A Lot Help needed standing up from a chair using your arms (e.g., wheelchair or bedside chair)?: A Little Help needed to walk in hospital room?: A Little Help needed climbing 3-5 steps with a railing? : A Little 6 Click Score: 16    End of Session Equipment Utilized During Treatment: Gait belt Activity Tolerance: Patient tolerated treatment well Patient left: in chair;with call bell/phone within reach;with chair alarm set Nurse Communication: Mobility status;Precautions PT Visit Diagnosis: Other abnormalities of gait and mobility (R26.89);Pain;Difficulty in walking, not elsewhere classified (R26.2) Pain - Right/Left: Left Pain - part of body: Hip     Time: 1024-1040 PT Time Calculation (min) (ACUTE ONLY): 16 min  Charges:  $Gait Training: 8-22 mins                     Jetta Lout PTA 07/07/20, 11:06 AM

## 2020-07-07 NOTE — Progress Notes (Signed)
  Subjective: 3 Days Post-Op Procedure(s) (LRB): ARTHROPLASTY BIPOLAR HIP (HEMIARTHROPLASTY) (Left) Patient reports pain as mild.  Pain only with movement Patient is well, and has had no acute complaints or problems Plan is to go Rehab after hospital stay. Negative for chest pain and shortness of breath Fever: no Gastrointestinal: Negative for nausea and vomiting  Objective: Vital signs in last 24 hours: Temp:  [97.8 F (36.6 C)-99.3 F (37.4 C)] 98.4 F (36.9 C) (08/28 0804) Pulse Rate:  [71-78] 78 (08/28 0804) Resp:  [16-19] 16 (08/28 0804) BP: (100-136)/(58-73) 136/73 (08/28 0804) SpO2:  [98 %-99 %] 98 % (08/28 0804)  Intake/Output from previous day:  Intake/Output Summary (Last 24 hours) at 07/07/2020 0937 Last data filed at 07/06/2020 1340 Gross per 24 hour  Intake 240 ml  Output --  Net 240 ml    Intake/Output this shift: No intake/output data recorded.  Labs: Recent Labs    07/05/20 0504 07/06/20 0541  HGB 12.2 12.0   Recent Labs    07/05/20 0504 07/06/20 0541  WBC 15.4* 14.5*  RBC 4.08 4.09  HCT 35.7* 35.0*  PLT 166 147*   Recent Labs    07/05/20 0504 07/06/20 0541  NA 136 133*  K 3.7 4.2  CL 103 98  CO2 26 32  BUN 8 8  CREATININE 0.52 0.57  GLUCOSE 137* 119*  CALCIUM 8.5* 8.4*   No results for input(s): LABPT, INR in the last 72 hours.   EXAM General - Patient is Alert and Oriented Extremity - Neurovascular intact Sensation intact distally Dorsiflexion/Plantar flexion intact Compartment soft Dressing/Incision - clean, dry, no drainage Motor Function - intact, moving foot and toes well on exam.   Past Medical History:  Diagnosis Date  . Allergy   . Hernia of anterior abdominal wall   . Hyperlipidemia   . Hypertension   . Osteopenia 09/08/2017   Oct 2018; next scan => Sep 10, 2019    Assessment/Plan: 3 Days Post-Op Procedure(s) (LRB): ARTHROPLASTY BIPOLAR HIP (HEMIARTHROPLASTY) (Left) Active Problems:   Closed left hip  fracture (HCC)  Estimated body mass index is 20.8 kg/m as calculated from the following:   Height as of this encounter: 5\' 5"  (1.651 m).   Weight as of this encounter: 56.7 kg. Advance diet Up with therapy D/C IV fluids  Pain well controlled Vital signs are stable Follow-up at [facility] clinic orthopedics in 2 weeks for staple removal and left hip x-rays. Care management to assist with discharge to skilled nursing facility, currently pending placement Okay to discharge from orthopedic standpoint  Lovenox 40 mg daily x4 weeks starting postop day 1  DVT Prophylaxis - Lovenox, TED hose and SCDs Weight-Bearing as tolerated to left leg  T. [facility], PA-C Orthopaedic Surgery 07/07/2020, 9:37 AM

## 2020-07-08 LAB — GLUCOSE, CAPILLARY
Glucose-Capillary: 102 mg/dL — ABNORMAL HIGH (ref 70–99)
Glucose-Capillary: 119 mg/dL — ABNORMAL HIGH (ref 70–99)
Glucose-Capillary: 119 mg/dL — ABNORMAL HIGH (ref 70–99)
Glucose-Capillary: 139 mg/dL — ABNORMAL HIGH (ref 70–99)

## 2020-07-08 MED ORDER — POLYETHYLENE GLYCOL 3350 17 G PO PACK: 17.0000 g | PACK | Freq: Two times a day (BID) | ORAL | 0 refills | Status: DC | PRN

## 2020-07-08 NOTE — Progress Notes (Signed)
  Subjective: 4 Days Post-Op Procedure(s) (LRB): ARTHROPLASTY BIPOLAR HIP (HEMIARTHROPLASTY) (Left) Patient reports pain as mild.  Pain only with movement Patient is well, and has had no acute complaints or problems Plan is to go Rehab after hospital stay. Negative for chest pain and shortness of breath Fever: no Gastrointestinal: Negative for nausea and vomiting  Objective: Vital signs in last 24 hours: Temp:  [98.3 F (36.8 C)-99.8 F (37.7 C)] 98.3 F (36.8 C) (08/29 0824) Pulse Rate:  [77-80] 79 (08/29 0824) Resp:  [14-20] 16 (08/29 0824) BP: (106-129)/(58-72) 129/72 (08/29 0824) SpO2:  [95 %-99 %] 98 % (08/29 0824)  Intake/Output from previous day: No intake or output data in the 24 hours ending 07/08/20 0900  Intake/Output this shift: No intake/output data recorded.  Labs: Recent Labs    07/06/20 0541  HGB 12.0   Recent Labs    07/06/20 0541  WBC 14.5*  RBC 4.09  HCT 35.0*  PLT 147*   Recent Labs    07/06/20 0541  NA 133*  K 4.2  CL 98  CO2 32  BUN 8  CREATININE 0.57  GLUCOSE 119*  CALCIUM 8.4*   No results for input(s): LABPT, INR in the last 72 hours.   EXAM General - Patient is Alert and Oriented Extremity - Neurovascular intact Sensation intact distally Dorsiflexion/Plantar flexion intact Compartment soft Dressing/Incision - clean, dry, no drainage Motor Function - intact, moving foot and toes well on exam.   Past Medical History:  Diagnosis Date  . Allergy   . Hernia of anterior abdominal wall   . Hyperlipidemia   . Hypertension   . Osteopenia 09/08/2017   Oct 2018; next scan => Sep 10, 2019    Assessment/Plan: 4 Days Post-Op Procedure(s) (LRB): ARTHROPLASTY BIPOLAR HIP (HEMIARTHROPLASTY) (Left) Active Problems:   Closed left hip fracture (HCC)  Estimated body mass index is 20.8 kg/m as calculated from the following:   Height as of this encounter: 5\' 5"  (1.651 m).   Weight as of this encounter: 56.7 kg. Advance diet Up  with therapy D/C IV fluids  Pain well controlled Vital signs are stable Follow-up at [facility] clinic orthopedics in 2 weeks for staple removal and left hip x-rays. Care management to assist with discharge to skilled nursing facility, currently pending placement Okay to discharge from orthopedic standpoint  Lovenox 40 mg daily x4 weeks starting postop day 1  DVT Prophylaxis - Lovenox, TED hose and SCDs Weight-Bearing as tolerated to left leg  T. [facility], PA-C Orthopaedic Surgery 07/08/2020, 9:00 AM

## 2020-07-08 NOTE — Discharge Summary (Signed)
Discharge Summary  Laura Deleon FYB:[id number] DOB: [birth year]  PCP: Danelle Berry, PA-C  Admit date: 07/03/2020 Anticipated discharge date: 07/09/2020  Time spent: 25 minutes  Recommendations for Outpatient Follow-up:  1. New medication: As needed Ultram 2. New medication: As needed oxycodone 3. New medication: As needed MiraLAX 4. New medication: Lovenox 40 mg subcu daily x14 days 5. Patient will follow up with orthopedic surgery in 2 weeks and at that time will have wound evaluated and x-rays done 6. Patient being [facility]  Discharge Diagnoses:  Active Hospital Problems   Diagnosis Date Noted  . Closed left hip fracture (HCC) 07/03/2020    Resolved Hospital Problems  No resolved problems to display.    Discharge Condition: Improved, being discharged to skilled nursing  Diet recommendation: Low-sodium  Vitals:   07/07/20 2336 07/08/20 0824  BP: 114/60 129/72  Pulse: 80 79  Resp: 20 16  Temp: 99.8 F (37.7 C) 98.3 F (36.8 C)  SpO2: 95% 98%    History of present illness:  74 year old female past medical history of hypertension and dyslipidemia admitted on 8/24 after sustaining mechanical fall landing on her left side and unable to ambulate.  Found to have a left-sided hip fracture.  Seen by orthopedic surgery and underwent a left hip hemiarthroplasty on 8/25.  Postop did well, no complications.  Hospital Course:  Active Problems:   Closed left hip fracture [facility]): Status post hemiarthroplasty.  Stable.  Discharge to skilled nursing on 8/30.  Follow-up with orthopedics in 2 weeks.  Lovenox x14 days.  Okay to continue aspirin. Essential hypertension: Blood pressure stable. Dyslipidemia: Continue statin  Procedures:  Status post left hip hemiarthroplasty done by orthopedic surgery on 8/25  Consultations:  Orthopedic surgery  Discharge Exam: BP 129/72 (BP Location: Right Arm)   Pulse 79   Temp 98.3 F (36.8 C) (Oral)   Resp  16   Ht 5\' 5"  (1.651 m)   Wt 56.7 kg   SpO2 98%   BMI 20.80 kg/m   General: Alert and oriented x3, no acute distress Cardiovascular: Regular rate and rhythm, S1-S2 Respiratory: Clear to auscultation bilaterally  Discharge Instructions You were cared for by a hospitalist during your hospital stay. If you have any questions about your discharge medications or the care you received while you were in the hospital after you are discharged, you can call the unit and asked to speak with the hospitalist on call if the hospitalist that took care of you is not available. Once you are discharged, your primary care physician will handle any further medical issues. Please note that NO REFILLS for any discharge medications will be authorized once you are discharged, as it is imperative that you return to your primary care physician (or establish a relationship with a primary care physician if you do not have one) for your aftercare needs so that they can reassess your need for medications and monitor your lab values.   Allergies as of 07/08/2020      Reactions   Sulfa Antibiotics Shortness Of Breath   Latex       Medication List    TAKE these medications   aspirin 81 MG EC tablet Take 81 mg by mouth daily. Swallow whole.   atorvastatin 10 MG tablet Commonly known as: LIPITOR Take 10 mg PO every other day at bedtime   enoxaparin 40 MG/0.4ML injection Commonly known as: LOVENOX Inject 0.4 mLs (40 mg total) into the skin daily for 14 days.  ezetimibe 10 MG tablet Commonly known as: ZETIA Take 1 tablet (10 mg total) by mouth daily.   fluticasone 50 MCG/ACT nasal spray Commonly known as: FLONASE Place 2 sprays into both nostrils as needed.   loratadine 10 MG tablet Commonly known as: CLARITIN Take 1 tablet (10 mg total) by mouth daily as needed for allergies.   metoprolol succinate 50 MG 24 hr tablet Commonly known as: TOPROL-XL TAKE 1 TABLET BY MOUTH DAILY WITH OR IMMEDIATELY FOLLOWING  A MEAL What changed:   how much to take  how to take this  when to take this  additional instructions   Multi-Vitamins Tabs Take 1 tablet by mouth daily.   oxyCODONE 5 MG immediate release tablet Commonly known as: Oxy IR/ROXICODONE Take 0.5-1 tablets (2.5-5 mg total) by mouth every 4 (four) hours as needed for moderate pain (pain score 4-6).   polyethylene glycol 17 g packet Commonly known as: MIRALAX / GLYCOLAX Take 17 g by mouth 2 (two) times daily as needed for mild constipation.   traMADol 50 MG tablet Commonly known as: ULTRAM Take 1 tablet (50 mg total) by mouth every 6 (six) hours as needed for moderate pain.      Allergies  Allergen Reactions  . Sulfa Antibiotics Shortness Of Breath  . Latex     Contact information for follow-up providers    Dedra Skeens, PA-C Follow up in 2 week(s).   Specialty: Orthopedic Surgery Why: For x-rays of the left hip and staple removal Contact information: [address] [place] [address] [phone]            Contact information for after-discharge care    Destination    [facility] Preferred SNF .   Service: Skilled Nursing Contact information: [address] [place] [address] [phone]                   The results of significant diagnostics from this hospitalization (including imaging, microbiology, ancillary and laboratory) are listed below for reference.    Significant Diagnostic Studies: DG Pelvis 1-2 Views  Result Date: 07/04/2020 CLINICAL DATA:  Status post left hemiarthroplasty EXAM: PELVIS - 1-2 VIEW COMPARISON:  None. FINDINGS: Intraoperative frontal view obtained. There is a drilling type prosthesis in place for hip arthroplasty on the left with the current prosthesis within the trabecular portion of bone. No fracture or dislocation. There is an apparent sponge along the lateral aspect of the left hip joint currently. Right hip  joint appears unremarkable. IMPRESSION: Apparent drilling prosthesis on the left in essentially anatomic alignment. No acute fracture or dislocation. Apparent sponge lateral to the left hip joint. Electronically Signed   By: Bretta Bang III M.D.   On: 07/04/2020 15:24   DG Pelvis Portable  Result Date: 07/04/2020 CLINICAL DATA:  Postop EXAM: PORTABLE PELVIS 1-2 VIEWS COMPARISON:  07/04/2020 FINDINGS: Pubic symphysis and rami appear intact. Status post left hip replacement with intact hardware and normal alignment. Gas in the soft tissues consistent with recent surgery. IMPRESSION: Status post left hip replacement with expected postsurgical change. Electronically Signed   By: Jasmine Pang M.D.   On: 07/04/2020 16:12   DG Chest Port 1 View  Result Date: 07/03/2020 CLINICAL DATA:  Left hip fracture.  Post fall. EXAM: PORTABLE CHEST 1 VIEW COMPARISON:  Chest radiograph 08/17/2018 FINDINGS: The cardiomediastinal contours are normal. Mild aortic atherosclerosis. Pulmonary vasculature is normal. No consolidation, pleural effusion, or pneumothorax. No acute osseous abnormalities are  seen. IMPRESSION: No acute chest findings. Electronically Signed   By: Narda RutherfordMelanie  Sanford M.D.   On: 07/03/2020 23:07   DG Hip Unilat W or Wo Pelvis 2-3 Views Left  Result Date: 07/03/2020 CLINICAL DATA:  Fall, left hip pain EXAM: DG HIP (WITH OR WITHOUT PELVIS) 2-3V LEFT COMPARISON:  None. FINDINGS: There is a left femoral neck fracture with varus angulation. No subluxation or dislocation. SI joints symmetric. IMPRESSION: Left femoral neck fracture with varus angulation. Electronically Signed   By: Charlett NoseKevin  Dover M.D.   On: 07/03/2020 23:06    Microbiology: Recent Results (from the past 240 hour(s))  SARS Coronavirus 2 by RT PCR (hospital order, performed in [facility] lab) Nasopharyngeal Nasopharyngeal Swab     Status: None   Collection Time: 07/03/20 10:28 PM   Specimen: Nasopharyngeal Swab  Result Value Ref  Range Status   SARS Coronavirus 2 NEGATIVE NEGATIVE Final    Comment: (NOTE) SARS-CoV-2 target nucleic acids are NOT DETECTED.  The SARS-CoV-2 RNA is generally detectable in upper and lower respiratory specimens during the acute phase of infection. The lowest concentration of SARS-CoV-2 viral copies this assay can detect is 250 copies / mL. A negative result does not preclude SARS-CoV-2 infection and should not be used as the sole basis for treatment or other patient management decisions.  A negative result may occur with improper specimen collection / handling, submission of specimen other than nasopharyngeal swab, presence of viral mutation(s) within the areas targeted by this assay, and inadequate number of viral copies (<250 copies / mL). A negative result must be combined with clinical observations, patient history, and epidemiological information.  Fact Sheet for Patients:   BoilerBrush.com.cyhttps://www.fda.gov/media/136312/download  Fact Sheet for Healthcare Providers: https://pope.com/https://www.fda.gov/media/136313/download  This test is not yet approved or  cleared by the [place] FDA and has been authorized for detection and/or diagnosis of SARS-CoV-2 by FDA under an Emergency Use Authorization (EUA).  This EUA will remain in effect (meaning this test can be used) for the duration of the COVID-19 declaration under Section 564(b)(1) of the Act, 21 U.S.C. section 360bbb-3(b)(1), unless the authorization is terminated or revoked sooner.  Performed at [facility], [address] [place], [place] [address]      Labs: Basic Metabolic Panel: Recent Labs  Lab 07/03/20 2220 07/04/20 0459 07/05/20 0504 07/06/20 0541  NA 136 133* 136 133*  K 3.3* 4.5 3.7 4.2  CL 99 98 103 98  CO2 27 28 26  32  GLUCOSE 202* 142* 137* 119*  BUN 12 9 8 8   CREATININE 0.54 0.65 0.52 0.57  CALCIUM 9.0 8.7* 8.5* 8.4*  MG  --   --  2.0  --    Liver Function Tests: Recent Labs  Lab 07/03/20 2220  AST  30  ALT 21  ALKPHOS 56  BILITOT 0.5  PROT 7.1  ALBUMIN 4.1   No results for input(s): LIPASE, AMYLASE in the last 168 hours. No results for input(s): AMMONIA in the last 168 hours. CBC: Recent Labs  Lab 07/03/20 2220 07/04/20 0459 07/05/20 0504 07/06/20 0541  WBC 12.5* 14.2* 15.4* 14.5*  NEUTROABS 8.3*  --   --   --   HGB 14.4 13.6 12.2 12.0  HCT 43.6 40.5 35.7* 35.0*  MCV 87.7 86.9 87.5 85.6  PLT 219 195 166 147*   Cardiac Enzymes: No results for input(s): CKTOTAL, CKMB, CKMBINDEX, TROPONINI in the last 168 hours. BNP: BNP (last 3 results) No results for input(s): BNP in the last  8760 hours.  ProBNP (last 3 results) No results for input(s): PROBNP in the last 8760 hours.  CBG: Recent Labs  Lab 07/07/20 1144 07/07/20 1654 07/07/20 2147 07/08/20 0825 07/08/20 1214  GLUCAP 166* 88 106* 102* 119*       Signed:  Hollice Espy, MD [facility] 07/08/2020, 3:55 PM

## 2020-07-08 NOTE — TOC Progression Note (Signed)
Transition of Care [facility]) - Progression Note    Patient Details  Name: Laura Deleon MRN: [id number] Date of Birth: 09-10-1946  Transition of [facility]) CM/SW Contact  Ashley Royalty Lutricia Feil, RN Phone Number: 07/08/2020, 4:10 PM  Clinical Narrative:    Sherron Monday with Denyse [facility] for pt to arrive tomorrow. TOC RN at bedside and updated pt and family member. Also verified with [facility]TA on authorization. Spoke with Eunice Blase who verified SNF [phone], ambulance with [facility] [phone] and [facility] [id number]. No further needs at this time.   Team updated (MD/RN). TOC will continue to follow up as needed.      Barriers to Discharge: No Barriers Identified  Expected Discharge Plan and Services                                                 Social Determinants of Health (SDOH) Interventions    Readmission Risk Interventions No flowsheet data found.

## 2020-07-08 NOTE — Progress Notes (Signed)
Physical Therapy Treatment Patient Details Name: Laura Deleon MRN: [id number] DOB: [birth year] Today's Date: 07/08/2020    History of Present Illness Pt is a 74 year old female with history of HTN, osteopenia and dyslipidemia presenting after accidental fall and subsequent left hip pain and inability to ambulate.  underwent L hip hemiarthroplasty 8/25. Posterior hip precautions, WBAT.    PT Comments    Pt ready for session.  Stood and is able to transfer to commode, complete lap around unit and stair training with RW and min guard.    Discussed discharge plan at length.  She continues to voice increased anxiety with discharge home vs SNF.  She stated she has limited support and her family works and is unable to take off work.  She is encouraged to speak with family and friends today to see if they can provide supervision initially upon discharge.  She remains hesitant but given progression of mobility she may be more appropriate for home vs SNF at discharge.  Anxiety, fear of falling and lack of support are primary barriers a this time.  Will continue discussion with pt and team regarding appropriate discharge plan.   Follow Up Recommendations  SNF;Supervision for mobility/OOB;Supervision/Assistance - 24 hour;Other (comment)     Equipment Recommendations  Rolling walker with 5" wheels;3in1 (PT)    Recommendations for Other Services       Precautions / Restrictions Precautions Precautions: Fall;Posterior Hip Precaution Booklet Issued: Yes (comment) Restrictions Weight Bearing Restrictions: Yes RLE Weight Bearing: Weight bearing as tolerated LLE Weight Bearing: Weight bearing as tolerated    Mobility  Bed Mobility               General bed mobility comments: in recliner pre/post session  Transfers Overall transfer level: Needs assistance Equipment used: Rolling walker (2 wheeled) Transfers: Sit to/from Stand Sit to Stand: Min guard             Ambulation/Gait Ambulation/Gait assistance: Min guard Gait Distance (Feet): 200 Feet Assistive device: Rolling walker (2 wheeled) Gait Pattern/deviations: Step-through pattern;Trunk flexed Gait velocity: decreased   General Gait Details: Pt ambulated one lap around RN station with increased time but no LOB or unsteadiness   Stairs Stairs: Yes Stairs assistance: Min guard Stair Management: Two rails;Forwards;Step to pattern Number of Stairs: 4 General stair comments: general ease   Wheelchair Mobility    Modified Rankin (Stroke Patients Only)       Balance Overall balance assessment: Needs assistance Sitting-balance support: Feet supported Sitting balance-Leahy Scale: Good     Standing balance support: Bilateral upper extremity supported Standing balance-Leahy Scale: Good                              Cognition Arousal/Alertness: Awake/alert Behavior During Therapy: WFL for tasks assessed/performed Overall Cognitive Status: Within Functional Limits for tasks assessed                                 General Comments: pt was able to recall 3/3 hip precautions      Exercises Other Exercises Other Exercises: to commode to void prior to gait.  no assist for self care.    General Comments        Pertinent Vitals/Pain Pain Assessment: Faces Faces Pain Scale: Hurts a little bit Pain Location: L hip/low back at rest Pain Descriptors / Indicators: Sore Pain Intervention(s): Limited activity within patient's  tolerance;Monitored during session;Repositioned    Home Living                      Prior Function            PT Goals (current goals can now be found in the care plan section) Progress towards PT goals: Progressing toward goals    Frequency    BID      PT Plan Current plan remains appropriate    Co-evaluation              AM-PAC PT "6 Clicks" Mobility   Outcome Measure  Help needed turning from  your back to your side while in a flat bed without using bedrails?: A Little Help needed moving from lying on your back to sitting on the side of a flat bed without using bedrails?: A Little Help needed moving to and from a bed to a chair (including a wheelchair)?: A Little Help needed standing up from a chair using your arms (e.g., wheelchair or bedside chair)?: A Little Help needed to walk in hospital room?: A Little Help needed climbing 3-5 steps with a railing? : A Little 6 Click Score: 18    End of Session Equipment Utilized During Treatment: Gait belt Activity Tolerance: Patient tolerated treatment well Patient left: in chair;with call bell/phone within reach;with chair alarm set Nurse Communication: Mobility status;Precautions Pain - Right/Left: Left Pain - part of body: Hip     Time: 8295-6213 PT Time Calculation (min) (ACUTE ONLY): 23 min  Charges:  $Gait Training: 8-22 mins $Therapeutic Activity: 8-22 mins                    Danielle Dess, PTA 07/08/20, 11:13 AM

## 2020-07-09 ENCOUNTER — Inpatient Hospital Stay: Admission: RE | Admit: 2020-07-09 | Payer: PPO | Source: Ambulatory Visit

## 2020-07-09 DIAGNOSIS — R279 Unspecified lack of coordination: Secondary | ICD-10-CM | POA: Diagnosis not present

## 2020-07-09 DIAGNOSIS — W19XXXD Unspecified fall, subsequent encounter: Secondary | ICD-10-CM | POA: Diagnosis not present

## 2020-07-09 DIAGNOSIS — J3089 Other allergic rhinitis: Secondary | ICD-10-CM | POA: Diagnosis not present

## 2020-07-09 DIAGNOSIS — W19XXXA Unspecified fall, initial encounter: Secondary | ICD-10-CM | POA: Diagnosis not present

## 2020-07-09 DIAGNOSIS — Z96649 Presence of unspecified artificial hip joint: Secondary | ICD-10-CM | POA: Diagnosis not present

## 2020-07-09 DIAGNOSIS — R488 Other symbolic dysfunctions: Secondary | ICD-10-CM | POA: Diagnosis not present

## 2020-07-09 DIAGNOSIS — E785 Hyperlipidemia, unspecified: Secondary | ICD-10-CM | POA: Diagnosis not present

## 2020-07-09 DIAGNOSIS — R000 Tachycardia, unspecified: Secondary | ICD-10-CM | POA: Diagnosis not present

## 2020-07-09 DIAGNOSIS — R262 Difficulty in walking, not elsewhere classified: Secondary | ICD-10-CM | POA: Diagnosis not present

## 2020-07-09 DIAGNOSIS — I10 Essential (primary) hypertension: Secondary | ICD-10-CM | POA: Diagnosis not present

## 2020-07-09 DIAGNOSIS — K5900 Constipation, unspecified: Secondary | ICD-10-CM | POA: Diagnosis not present

## 2020-07-09 DIAGNOSIS — E569 Vitamin deficiency, unspecified: Secondary | ICD-10-CM | POA: Diagnosis not present

## 2020-07-09 DIAGNOSIS — R52 Pain, unspecified: Secondary | ICD-10-CM | POA: Diagnosis not present

## 2020-07-09 DIAGNOSIS — M25552 Pain in left hip: Secondary | ICD-10-CM | POA: Diagnosis not present

## 2020-07-09 DIAGNOSIS — M6281 Muscle weakness (generalized): Secondary | ICD-10-CM | POA: Diagnosis not present

## 2020-07-09 DIAGNOSIS — D649 Anemia, unspecified: Secondary | ICD-10-CM | POA: Diagnosis not present

## 2020-07-09 DIAGNOSIS — D8989 Other specified disorders involving the immune mechanism, not elsewhere classified: Secondary | ICD-10-CM | POA: Diagnosis not present

## 2020-07-09 DIAGNOSIS — R5381 Other malaise: Secondary | ICD-10-CM | POA: Diagnosis not present

## 2020-07-09 DIAGNOSIS — S72002D Fracture of unspecified part of neck of left femur, subsequent encounter for closed fracture with routine healing: Secondary | ICD-10-CM | POA: Diagnosis not present

## 2020-07-09 DIAGNOSIS — S72002G Fracture of unspecified part of neck of left femur, subsequent encounter for closed fracture with delayed healing: Secondary | ICD-10-CM

## 2020-07-09 DIAGNOSIS — M80852D Other osteoporosis with current pathological fracture, left femur, subsequent encounter for fracture with routine healing: Secondary | ICD-10-CM | POA: Diagnosis not present

## 2020-07-09 DIAGNOSIS — K5909 Other constipation: Secondary | ICD-10-CM | POA: Diagnosis not present

## 2020-07-09 DIAGNOSIS — Z743 Need for continuous supervision: Secondary | ICD-10-CM | POA: Diagnosis not present

## 2020-07-09 LAB — GLUCOSE, CAPILLARY
Glucose-Capillary: 99 mg/dL (ref 70–99)
Glucose-Capillary: 168 mg/dL — ABNORMAL HIGH (ref 70–99)

## 2020-07-09 LAB — SURGICAL PATHOLOGY

## 2020-07-09 MED ORDER — SODIUM CHLORIDE 0.9 % IV BOLUS: 1000.0000 mL | Freq: Once | INTRAVENOUS | Status: DC

## 2020-07-09 MED ORDER — SIMETHICONE 80 MG PO CHEW
160.0000 mg | CHEWABLE_TABLET | Freq: Once | ORAL | Status: AC
  Administered 2020-07-09: 160 mg via ORAL
  Filled 2020-07-09: qty 2

## 2020-07-09 NOTE — Progress Notes (Signed)
PT Cancellation Note  Patient Details Name: Laura Deleon MRN: [id number] DOB: December 29, 1945   Cancelled Treatment:    Reason Eval/Treat Not Completed: Medical issues which prohibited therapy   Pt in chair.  Reports her heart feels like it is beating fast.  HR 110.  RN notified.  Will hold session and return later today if not discharged.   Danielle Dess 07/09/2020, 12:28 PM

## 2020-07-09 NOTE — Discharge Summary (Signed)
D/c Summary completed by Dr Rito Ehrlich on 8/29. No further updates. Stable for discharge.

## 2020-07-09 NOTE — Care Management Important Message (Signed)
Important Message  Patient Details  Name: Laura Deleon MRN: [id number] Date of Birth: [birth year]   Medicare Important Message Given:  Yes     [name] 07/09/2020, 1:14 PM

## 2020-07-09 NOTE — TOC Progression Note (Signed)
Transition of Care [facility]) - Progression Note    Patient Details  Name: Laura Deleon MRN: [id number] Date of Birth: 1946/06/07  Transition of Care [facility]) CM/SW Contact  Barrie Dunker, RN Phone Number: 07/09/2020, 10:09 AM  Clinical Narrative:   Called EMS to transport the patient to [facility], Nurse aware      Barriers to Discharge: No Barriers Identified  Expected Discharge Plan and Services           Expected Discharge Date: 07/09/20                                     Social Determinants of Health (SDOH) Interventions    Readmission Risk Interventions No flowsheet data found.

## 2020-07-09 NOTE — TOC Progression Note (Signed)
Transition of Care [facility]) - Progression Note    Patient Details  Name: Laura Deleon MRN: [id number] Date of Birth: [birth year]  Transition of Care [facility]) CM/SW Contact  Barrie Dunker, RN Phone Number: 07/09/2020, 8:30 AM  Clinical Narrative:   DC packet on the chart, DC summary and oprders sent thru the Hub to the facility      Barriers to Discharge: No Barriers Identified  Expected Discharge Plan and Services           Expected Discharge Date: 07/09/20                                     Social Determinants of Health (SDOH) Interventions    Readmission Risk Interventions No flowsheet data found.

## 2020-07-09 NOTE — Progress Notes (Signed)
  Subjective: 5 Days Post-Op Procedure(s) (LRB): ARTHROPLASTY BIPOLAR HIP (HEMIARTHROPLASTY) (Left) Patient reports pain as mild.  Pain only with movement Patient is well, and has had no acute complaints or problems Plan is to go Rehab after hospital stay. Negative for chest pain and shortness of breath Fever: no Gastrointestinal: Negative for nausea and vomiting  Objective: Vital signs in last 24 hours: Temp:  [98.3 F (36.8 C)-98.9 F (37.2 C)] 98.7 F (37.1 C) (08/30 0053) Pulse Rate:  [79-85] 79 (08/30 0053) Resp:  [16-17] 16 (08/30 0053) BP: (128-136)/(72-82) 128/77 (08/30 0053) SpO2:  [98 %-99 %] 98 % (08/30 0053)  Intake/Output from previous day:  Intake/Output Summary (Last 24 hours) at 07/09/2020 0713 Last data filed at 07/08/2020 0911 Gross per 24 hour  Intake 240 ml  Output --  Net 240 ml    Intake/Output this shift: No intake/output data recorded.  Labs: No results for input(s): HGB in the last 72 hours. No results for input(s): WBC, RBC, HCT, PLT in the last 72 hours. No results for input(s): NA, K, CL, CO2, BUN, CREATININE, GLUCOSE, CALCIUM in the last 72 hours. No results for input(s): LABPT, INR in the last 72 hours.   EXAM General - Patient is Alert and Oriented Extremity - Neurovascular intact Sensation intact distally Dorsiflexion/Plantar flexion intact Compartment soft Dressing/Incision - clean, dry, no drainage Motor Function - intact, moving foot and toes well on exam.   Past Medical History:  Diagnosis Date  . Allergy   . Hernia of anterior abdominal wall   . Hyperlipidemia   . Hypertension   . Osteopenia 09/08/2017   Oct 2018; next scan => Sep 10, 2019    Assessment/Plan: 5 Days Post-Op Procedure(s) (LRB): ARTHROPLASTY BIPOLAR HIP (HEMIARTHROPLASTY) (Left) Active Problems:   Closed left hip fracture (HCC)  Estimated body mass index is 20.8 kg/m as calculated from the following:   Height as of this encounter: 5\' 5"  (1.651 m).    Weight as of this encounter: 56.7 kg. Advance diet Up with therapy D/C IV fluids  Pain well controlled Vital signs are stable Follow-up at [facility] clinic orthopedics in 2 weeks for staple removal and left hip x-rays. Care management to assist with discharge to skilled nursing facility, currently planning for placement today. Okay to discharge from orthopedic standpoint  Lovenox 40 mg daily x4 weeks starting postop day 1  DVT Prophylaxis - Lovenox, TED hose and SCDs Weight-Bearing as tolerated to left leg  [facility], PA-C Orthopaedic Surgery 07/09/2020, 7:13 AM

## 2020-07-09 NOTE — Progress Notes (Signed)
VAST RN returned to pt's bedside to obtain IV access. Upon arrival, notified that pt is being discharged and no longer needs IV access.

## 2020-07-09 NOTE — TOC Progression Note (Signed)
Transition of Care [facility]) - Progression Note    Patient Details  Name: Laura Deleon MRN: [id number] Date of Birth: 21-Jan-1946  Transition of Care [facility]) CM/SW Contact  Barrie Dunker, RN Phone Number: 07/09/2020, 3:27 PM  Clinical Narrative:   Heart rate 96, Dr Dorette Grate to go to [facility], Called EMS Nurse aware, patient ready      Barriers to Discharge: No Barriers Identified  Expected Discharge Plan and Services           Expected Discharge Date: 07/09/20                                     Social Determinants of Health (SDOH) Interventions    Readmission Risk Interventions No flowsheet data found.

## 2020-07-09 NOTE — Progress Notes (Signed)
Gave report to [place], RN at [facility] [phone]. EMS is currently here to transport patient to the facility.

## 2020-07-09 NOTE — Progress Notes (Signed)
VAST consult to obtain IV access. Upon entering room, pt up in chair eating lunch. Advised pt's nurse that she needs to be in bed for IV placement and currently bed has no sheets on it. Asked Charolett Bumpers to place new IVT consult when pt is in bed and ready for IV placement.

## 2020-07-12 DIAGNOSIS — S72002D Fracture of unspecified part of neck of left femur, subsequent encounter for closed fracture with routine healing: Secondary | ICD-10-CM | POA: Diagnosis not present

## 2020-07-12 DIAGNOSIS — I10 Essential (primary) hypertension: Secondary | ICD-10-CM | POA: Diagnosis not present

## 2020-07-12 DIAGNOSIS — E785 Hyperlipidemia, unspecified: Secondary | ICD-10-CM | POA: Diagnosis not present

## 2020-07-19 DIAGNOSIS — K5900 Constipation, unspecified: Secondary | ICD-10-CM | POA: Diagnosis not present

## 2020-07-19 DIAGNOSIS — I10 Essential (primary) hypertension: Secondary | ICD-10-CM | POA: Diagnosis not present

## 2020-07-19 DIAGNOSIS — S72002D Fracture of unspecified part of neck of left femur, subsequent encounter for closed fracture with routine healing: Secondary | ICD-10-CM | POA: Diagnosis not present

## 2020-07-19 DIAGNOSIS — E785 Hyperlipidemia, unspecified: Secondary | ICD-10-CM | POA: Diagnosis not present

## 2020-07-24 DIAGNOSIS — M25552 Pain in left hip: Secondary | ICD-10-CM | POA: Diagnosis not present

## 2020-07-31 ENCOUNTER — Ambulatory Visit: Payer: PPO | Admitting: Internal Medicine

## 2020-08-01 DIAGNOSIS — M810 Age-related osteoporosis without current pathological fracture: Secondary | ICD-10-CM | POA: Diagnosis not present

## 2020-08-01 DIAGNOSIS — Z7951 Long term (current) use of inhaled steroids: Secondary | ICD-10-CM | POA: Diagnosis not present

## 2020-08-01 DIAGNOSIS — Z7982 Long term (current) use of aspirin: Secondary | ICD-10-CM | POA: Diagnosis not present

## 2020-08-01 DIAGNOSIS — I10 Essential (primary) hypertension: Secondary | ICD-10-CM | POA: Diagnosis not present

## 2020-08-01 DIAGNOSIS — Z96642 Presence of left artificial hip joint: Secondary | ICD-10-CM | POA: Diagnosis not present

## 2020-08-01 DIAGNOSIS — E785 Hyperlipidemia, unspecified: Secondary | ICD-10-CM | POA: Diagnosis not present

## 2020-08-01 DIAGNOSIS — Z9181 History of falling: Secondary | ICD-10-CM | POA: Diagnosis not present

## 2020-08-01 DIAGNOSIS — Z79891 Long term (current) use of opiate analgesic: Secondary | ICD-10-CM | POA: Diagnosis not present

## 2020-08-01 DIAGNOSIS — E569 Vitamin deficiency, unspecified: Secondary | ICD-10-CM | POA: Diagnosis not present

## 2020-08-01 DIAGNOSIS — J3089 Other allergic rhinitis: Secondary | ICD-10-CM | POA: Diagnosis not present

## 2020-08-01 DIAGNOSIS — S72002D Fracture of unspecified part of neck of left femur, subsequent encounter for closed fracture with routine healing: Secondary | ICD-10-CM | POA: Diagnosis not present

## 2020-08-02 ENCOUNTER — Telehealth: Payer: Self-pay

## 2020-08-02 NOTE — Telephone Encounter (Signed)
Copied from CRM #[id number]. Topic: Quick Communication - Home Health Verbal Orders >> Aug 02, 2020 10:45 AM Adrian Prince D wrote: Caller/Agency: [facility] Callback Number: [phone] Requesting OT/PT/Skilled Nursing/Social Work/Speech Therapy: OT Frequency: one times one and then 2 times 2 and then one times one

## 2020-08-06 DIAGNOSIS — Z7982 Long term (current) use of aspirin: Secondary | ICD-10-CM | POA: Diagnosis not present

## 2020-08-06 DIAGNOSIS — E785 Hyperlipidemia, unspecified: Secondary | ICD-10-CM | POA: Diagnosis not present

## 2020-08-06 DIAGNOSIS — E569 Vitamin deficiency, unspecified: Secondary | ICD-10-CM | POA: Diagnosis not present

## 2020-08-06 DIAGNOSIS — S72002D Fracture of unspecified part of neck of left femur, subsequent encounter for closed fracture with routine healing: Secondary | ICD-10-CM | POA: Diagnosis not present

## 2020-08-06 DIAGNOSIS — Z7951 Long term (current) use of inhaled steroids: Secondary | ICD-10-CM | POA: Diagnosis not present

## 2020-08-06 DIAGNOSIS — Z79891 Long term (current) use of opiate analgesic: Secondary | ICD-10-CM | POA: Diagnosis not present

## 2020-08-06 DIAGNOSIS — M810 Age-related osteoporosis without current pathological fracture: Secondary | ICD-10-CM | POA: Diagnosis not present

## 2020-08-06 DIAGNOSIS — J3089 Other allergic rhinitis: Secondary | ICD-10-CM | POA: Diagnosis not present

## 2020-08-06 DIAGNOSIS — Z9181 History of falling: Secondary | ICD-10-CM | POA: Diagnosis not present

## 2020-08-06 DIAGNOSIS — I10 Essential (primary) hypertension: Secondary | ICD-10-CM | POA: Diagnosis not present

## 2020-08-06 DIAGNOSIS — Z96642 Presence of left artificial hip joint: Secondary | ICD-10-CM | POA: Diagnosis not present

## 2020-08-06 NOTE — Telephone Encounter (Signed)
Left detailed vm for verbal orders to stephanie

## 2020-08-07 DIAGNOSIS — Z9181 History of falling: Secondary | ICD-10-CM | POA: Diagnosis not present

## 2020-08-07 DIAGNOSIS — E569 Vitamin deficiency, unspecified: Secondary | ICD-10-CM | POA: Diagnosis not present

## 2020-08-07 DIAGNOSIS — Z7982 Long term (current) use of aspirin: Secondary | ICD-10-CM | POA: Diagnosis not present

## 2020-08-07 DIAGNOSIS — S72002D Fracture of unspecified part of neck of left femur, subsequent encounter for closed fracture with routine healing: Secondary | ICD-10-CM | POA: Diagnosis not present

## 2020-08-07 DIAGNOSIS — Z96642 Presence of left artificial hip joint: Secondary | ICD-10-CM | POA: Diagnosis not present

## 2020-08-07 DIAGNOSIS — Z79891 Long term (current) use of opiate analgesic: Secondary | ICD-10-CM | POA: Diagnosis not present

## 2020-08-07 DIAGNOSIS — J3089 Other allergic rhinitis: Secondary | ICD-10-CM | POA: Diagnosis not present

## 2020-08-07 DIAGNOSIS — E785 Hyperlipidemia, unspecified: Secondary | ICD-10-CM | POA: Diagnosis not present

## 2020-08-07 DIAGNOSIS — I10 Essential (primary) hypertension: Secondary | ICD-10-CM | POA: Diagnosis not present

## 2020-08-07 DIAGNOSIS — Z7951 Long term (current) use of inhaled steroids: Secondary | ICD-10-CM | POA: Diagnosis not present

## 2020-08-07 DIAGNOSIS — M810 Age-related osteoporosis without current pathological fracture: Secondary | ICD-10-CM | POA: Diagnosis not present

## 2020-08-08 ENCOUNTER — Encounter: Payer: Self-pay | Admitting: Emergency Medicine

## 2020-08-08 ENCOUNTER — Emergency Department
Admission: EM | Admit: 2020-08-08 | Discharge: 2020-08-08 | Disposition: A | Discharge status: Home / Self Care | Payer: PPO | Attending: Emergency Medicine | Admitting: Emergency Medicine

## 2020-08-08 ENCOUNTER — Other Ambulatory Visit: Payer: Self-pay

## 2020-08-08 DIAGNOSIS — S61213A Laceration without foreign body of left middle finger without damage to nail, initial encounter: Secondary | ICD-10-CM | POA: Insufficient documentation

## 2020-08-08 DIAGNOSIS — Z23 Encounter for immunization: Secondary | ICD-10-CM | POA: Diagnosis not present

## 2020-08-08 DIAGNOSIS — W268XXA Contact with other sharp object(s), not elsewhere classified, initial encounter: Secondary | ICD-10-CM | POA: Diagnosis not present

## 2020-08-08 DIAGNOSIS — Z91040 Latex allergy status: Secondary | ICD-10-CM | POA: Insufficient documentation

## 2020-08-08 DIAGNOSIS — Z79899 Other long term (current) drug therapy: Secondary | ICD-10-CM | POA: Diagnosis not present

## 2020-08-08 DIAGNOSIS — Z96642 Presence of left artificial hip joint: Secondary | ICD-10-CM | POA: Insufficient documentation

## 2020-08-08 DIAGNOSIS — I10 Essential (primary) hypertension: Secondary | ICD-10-CM | POA: Diagnosis not present

## 2020-08-08 DIAGNOSIS — S61212A Laceration without foreign body of right middle finger without damage to nail, initial encounter: Secondary | ICD-10-CM | POA: Diagnosis not present

## 2020-08-08 DIAGNOSIS — Z7982 Long term (current) use of aspirin: Secondary | ICD-10-CM | POA: Diagnosis not present

## 2020-08-08 MED ORDER — LIDOCAINE HCL (PF) 1 % IJ SOLN
5.0000 mL | Freq: Once | INTRAMUSCULAR | Status: AC
  Administered 2020-08-08: 5 mL via INTRADERMAL
  Filled 2020-08-08: qty 5

## 2020-08-08 MED ORDER — TETANUS-DIPHTH-ACELL PERTUSSIS 5-2.5-18.5 LF-MCG/0.5 IM SUSP
0.5000 mL | Freq: Once | INTRAMUSCULAR | Status: AC
  Administered 2020-08-08: 0.5 mL via INTRAMUSCULAR
  Filled 2020-08-08: qty 0.5

## 2020-08-08 MED ORDER — CEPHALEXIN 500 MG PO CAPS: 500.0000 mg | ORAL_CAPSULE | Freq: Three times a day (TID) | ORAL | 0 refills | Status: DC

## 2020-08-08 NOTE — ED Triage Notes (Signed)
Pt reports dropped her cane and went to try and catch it and hit her hand on her dishwasher when she did cutting her right hand middle finger

## 2020-08-08 NOTE — ED Provider Notes (Signed)
[facility] Emergency Department Provider Note  ____________________________________________   First MD Initiated Contact with Patient 08/08/20 1337     (approximate)  I have reviewed the triage vital signs and the nursing notes.   HISTORY  Chief Complaint Laceration  HPI Laura Deleon is a 74 y.o. female reports to the emergency department for evaluation of laceration of the distal right third digit.  The patient states there is a broken piece off of her dishwasher, creating a sharp edge that she hit the finger on while trying to reach her cane.  She tried to control the bleeding at home with pressure but was unable to do so.  She did not fall and did not have any other injury in this process.  She states that the finger does not really hurt.  She is unsure of the date of her last tetanus booster.         Past Medical History:  Diagnosis Date   Allergy    Hernia of anterior abdominal wall    Hyperlipidemia    Hypertension    Osteopenia 09/08/2017   Oct 2018; next scan => Sep 10, 2019    Patient Active Problem List   Diagnosis Date Noted   Closed left hip fracture (HCC) 07/03/2020   Hx of transient ischemic attack (TIA) 02/17/2019   Peripheral neuropathy 08/17/2018   Contracture of joint of finger of right hand 11/11/2017   Osteopenia 09/08/2017   Gallstone 12/16/2016   Calcification of abdominal aorta (HCC) 12/16/2016   Abdominal wall hernia 07/19/2015   Allergic rhinitis, seasonal 07/19/2015   Blood glucose elevated 07/19/2015   Hypertension goal BP (blood pressure) < 140/90 07/19/2015   Pure hypercholesterolemia 07/19/2015   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   Hyperlipidemia LDL goal <70 07/19/2015    Past Surgical History:  Procedure Laterality Date   ABDOMINAL HYSTERECTOMY     BLADDER SURGERY     HAND SURGERY  11/14/2016   removal of foreign body   HERNIA REPAIR     with mesh   HIP ARTHROPLASTY Left  07/04/2020   Procedure: ARTHROPLASTY BIPOLAR HIP (HEMIARTHROPLASTY);  Surgeon: Signa Kell, MD;  Location: [facility] ORS;  Service: Orthopedics;  Laterality: Left;    Prior to Admission medications   Medication Sig Start Date End Date Taking? Authorizing Provider  aspirin 81 MG EC tablet Take 81 mg by mouth daily. Swallow whole.    [provider]  atorvastatin (LIPITOR) 10 MG tablet Take 10 mg PO every other day at bedtime 05/23/20   Danelle Berry, PA-C  cephALEXin (KEFLEX) 500 MG capsule Take 1 capsule (500 mg total) by mouth 3 (three) times daily for 10 days. 08/08/20 08/18/20  Lucy Chris, PA  enoxaparin (LOVENOX) 40 MG/0.4ML injection Inject 0.4 mLs (40 mg total) into the skin daily for 14 days. 07/05/20 07/19/20  Dedra Skeens, PA-C  ezetimibe (ZETIA) 10 MG tablet Take 1 tablet (10 mg total) by mouth daily. 11/15/19   Danelle Berry, PA-C  fluticasone (FLONASE) 50 MCG/ACT nasal spray Place 2 sprays into both nostrils as needed. 06/06/20   Danelle Berry, PA-C  loratadine (CLARITIN) 10 MG tablet Take 1 tablet (10 mg total) by mouth daily as needed for allergies. 05/23/20   Danelle Berry, PA-C  metoprolol succinate (TOPROL-XL) 50 MG 24 hr tablet TAKE 1 TABLET BY MOUTH DAILY WITH OR IMMEDIATELY FOLLOWING A MEAL Patient taking differently: Take 50 mg by mouth daily.  05/23/20   Danelle Berry, PA-C  Multiple Vitamin (  MULTI-VITAMINS) TABS Take 1 tablet by mouth daily. 02/01/09   [provider]  oxyCODONE (OXY IR/ROXICODONE) 5 MG immediate release tablet Take 0.5-1 tablets (2.5-5 mg total) by mouth every 4 (four) hours as needed for moderate pain (pain score 4-6). 07/05/20   Dedra Skeens, PA-C  polyethylene glycol (MIRALAX / GLYCOLAX) 17 g packet Take 17 g by mouth 2 (two) times daily as needed for mild constipation. 07/08/20   Hollice Espy, MD  traMADol (ULTRAM) 50 MG tablet Take 1 tablet (50 mg total) by mouth every 6 (six) hours as needed for moderate pain. 07/05/20   Dedra Skeens, PA-C     Allergies Sulfa antibiotics and Latex  Family History  Problem Relation Age of Onset   Hyperlipidemia Mother    Hypertension Mother    Hyperlipidemia Father    Emphysema Father    COPD Father    Breast cancer Neg Hx     Social History Social History   Tobacco Use   Smoking status: Never Smoker   Smokeless tobacco: Never Used   Tobacco comment: smoking cessation materials not required  Vaping Use   Vaping Use: Never used  Substance Use Topics   Alcohol use: No    Alcohol/week: 0.0 standard drinks   Drug use: No    Review of Systems Constitutional: No fever/chills Eyes: No visual changes. ENT: No sore throat. Cardiovascular: Denies chest pain. Respiratory: Denies shortness of breath. Gastrointestinal: No abdominal pain.  No nausea, no vomiting.  No diarrhea.  No constipation. Genitourinary: Negative for dysuria. Musculoskeletal: Negative for back pain. Skin: + Right hand laceration Neurological: Negative for headaches, focal weakness or numbness.   ____________________________________________   PHYSICAL EXAM:  VITAL SIGNS: ED Triage Vitals  Enc Vitals Group     BP 08/08/20 1307 (!) 169/86     Pulse Rate 08/08/20 1307 90     Resp 08/08/20 1307 18     Temp 08/08/20 1307 98.7 F (37.1 C)     Temp Source 08/08/20 1307 Oral     SpO2 08/08/20 1307 100 %     Weight 08/08/20 1256 125 lb (56.7 kg)     Height 08/08/20 1256 5\' 5"  (1.651 m)     Head Circumference --      [facility] Flow --      Pain Score 08/08/20 1256 3     Pain Loc --      Pain Edu? --      Excl. in GC? --    Constitutional: Alert and oriented. Well appearing and in no acute distress. Eyes: Conjunctivae are normal. PERRL. EOMI. Head: Atraumatic. Neck: No stridor.   Cardiovascular: Normal rate, regular rhythm. Grossly normal heart sounds.  Good peripheral circulation. Respiratory: Normal respiratory effort.  No retractions. Lungs CTAB. Gastrointestinal: Soft and nontender. No  distention. No abdominal bruits. No CVA tenderness. Musculoskeletal: Full range of motion of all digits and wrist without pain. Neurologic:  Normal speech and language. No gross focal neurologic deficits are appreciated. No gait instability. Skin: There is a V shaped laceration on the dorsal aspect of the distal right third digit extending near but not completely to the nailbed.  The V shape creates a flap.  The laceration is actively bleeding despite attempt to control bleeding with pressure and elevation Psychiatric: Mood and affect are normal. Speech and behavior are normal.  ____________________________________________   PROCEDURES  Procedure(s) performed (including Critical Care):  10/01/21Marland KitchenLaceration Repair  Date/Time: 08/08/2020 5:16 PM Performed by: 08/10/2020, PA Authorized  by: Lucy Chrisodgers, Brantlee Hinde J, PA   Consent:    Consent obtained:  Verbal   Consent given by:  Patient   Risks discussed:  Infection, pain and poor cosmetic result   Alternatives discussed:  No treatment and observation Anesthesia (see MAR for exact dosages):    Anesthesia method:  Nerve block   Block location:  Right third digit digital block   Block needle gauge:  30 G   Block anesthetic:  Lidocaine 1% w/o epi   Block injection procedure:  Anatomic landmarks identified, anatomic landmarks palpated and incremental injection   Block outcome:  Anesthesia achieved Laceration details:    Location:  Finger   Finger location:  R long finger   Length (cm):  2 (1cm in each direction from "V")   Depth (mm):  3 Repair type:    Repair type:  Simple Pre-procedure details:    Preparation:  Patient was prepped and draped in usual sterile fashion Exploration:    Hemostasis achieved with:  Direct pressure (Surgicel)   Contaminated: no   Treatment:    Area cleansed with:  Betadine and saline   Amount of cleaning:  Standard   Irrigation solution:  Sterile saline   Irrigation method:  Syringe Skin repair:     Repair method:  Sutures   Suture size:  5-0   Suture material:  Nylon   Suture technique:  Simple interrupted   Number of sutures:  8 Approximation:    Approximation:  Close Post-procedure details:    Dressing:  Non-adherent dressing   Patient tolerance of procedure:  Tolerated well, no immediate complications Comments:     The procedure was completed with the assistance of Ron Katrinka BlazingSmith, PA-C.     ____________________________________________   INITIAL IMPRESSION / ASSESSMENT AND PLAN / ED COURSE  As part of my medical decision making, I reviewed the following data within the electronic MEDICAL RECORD NUMBER Nursing notes reviewed and incorporated        Blenda BridegroomLinda Schnorr is a 74 year old female who reports to the emergency department for evaluation of laceration of the distal right third digit.  The laceration is in a V shape, creating a flap of tissue.  After discussion with the patient, she wished to proceed with repair of the laceration.  This was completed with the assistance of Ann Makiyan Ambers, [place]PA-C.  The patient tolerated well.  She was placed on antibiotics due to the location and nature of her laceration.  The patient will follow up with her primary care in 7 to 10 days for removal of the sutures.  The patient is amenable with this plan and will return to the emergency department for any worsening.      ____________________________________________   FINAL CLINICAL IMPRESSION(S) / ED DIAGNOSES  Final diagnoses:  Laceration of right middle finger without foreign body without damage to nail, initial encounter     ED Discharge Orders         Ordered    cephALEXin (KEFLEX) 500 MG capsule  3 times daily        08/08/20 1518          *Please note:  Girtha RmLinda Wyrick Harbaugh was evaluated in Emergency Department on 08/08/2020 for the symptoms described in the history of present illness. She was evaluated in the context of the global COVID-19 pandemic, which necessitated consideration that the  patient might be at risk for infection with the SARS-CoV-2 virus that causes COVID-19. Institutional protocols and algorithms that pertain to the evaluation of  patients at risk for COVID-19 are in a state of rapid change based on information released by regulatory bodies including the CDC and federal and state organizations. These policies and algorithms were followed during the patient's care in the ED.  Some ED evaluations and interventions may be delayed as a result of limited staffing during and the pandemic.*   Note:  This document was prepared using Dragon voice recognition software and may include unintentional dictation errors.    Lucy Chris, PA 08/08/20 1722    Minna Antis, MD 08/09/20 2006

## 2020-08-08 NOTE — Discharge Instructions (Signed)
Please have your sutures removed in 7 to 10 days. Please take the antibiotic as prescribed. You may follow-up with your primary care or return to the emergency department for any worsening.

## 2020-08-10 ENCOUNTER — Telehealth: Payer: Self-pay | Admitting: *Deleted

## 2020-08-10 NOTE — Chronic Care Management (AMB) (Signed)
  Care Management   Note  08/10/2020 Name: Laura Deleon MRN: [id number] DOB: 1946-07-21  Laura Deleon is a 74 y.o. year old female who is a primary care patient of Sander Radon and is actively engaged with the care management team. I reached out to [name] by phone today to assist with scheduling a follow up visit with the RN Case Manager.  Follow up plan: Telephone appointment with care management team member scheduled for:08/15/2020  [facility] Guide, Embedded Care Coordination [facility] Management

## 2020-08-15 ENCOUNTER — Ambulatory Visit: Payer: PPO

## 2020-08-15 DIAGNOSIS — Z79891 Long term (current) use of opiate analgesic: Secondary | ICD-10-CM | POA: Diagnosis not present

## 2020-08-15 DIAGNOSIS — E569 Vitamin deficiency, unspecified: Secondary | ICD-10-CM | POA: Diagnosis not present

## 2020-08-15 DIAGNOSIS — Z7951 Long term (current) use of inhaled steroids: Secondary | ICD-10-CM | POA: Diagnosis not present

## 2020-08-15 DIAGNOSIS — E785 Hyperlipidemia, unspecified: Secondary | ICD-10-CM | POA: Diagnosis not present

## 2020-08-15 DIAGNOSIS — M810 Age-related osteoporosis without current pathological fracture: Secondary | ICD-10-CM | POA: Diagnosis not present

## 2020-08-15 DIAGNOSIS — Z96642 Presence of left artificial hip joint: Secondary | ICD-10-CM | POA: Diagnosis not present

## 2020-08-15 DIAGNOSIS — I10 Essential (primary) hypertension: Secondary | ICD-10-CM | POA: Diagnosis not present

## 2020-08-15 DIAGNOSIS — Z7982 Long term (current) use of aspirin: Secondary | ICD-10-CM | POA: Diagnosis not present

## 2020-08-15 DIAGNOSIS — S72002D Fracture of unspecified part of neck of left femur, subsequent encounter for closed fracture with routine healing: Secondary | ICD-10-CM | POA: Diagnosis not present

## 2020-08-15 DIAGNOSIS — J3089 Other allergic rhinitis: Secondary | ICD-10-CM | POA: Diagnosis not present

## 2020-08-15 DIAGNOSIS — Z9181 History of falling: Secondary | ICD-10-CM | POA: Diagnosis not present

## 2020-08-15 NOTE — Progress Notes (Signed)
Patient ID: Laura Deleon, female    DOB: May 14, 1946, 74 y.o.   MRN: [id number]  PCP: Danelle Berry, PA-C  Chief Complaint  Patient presents with  . Suture / Staple Removal    seen at ER, cut hand on dishwasher    Subjective:   Laura Deleon is a 74 y.o. female, presents to clinic with CC of the following:  HPI  Pt presents for suture removal. She cut right 3rd finger on dishwasher at home, went to the ER on 08/08/2020, had V shaped flap, got 8 simple interrupted 5.0 sutures placed and was given keflex.  She is here to have sutures removed.  Denies any redness, purulent drainage. She still has some tenderness and swelling.  She has put hydrogen peroxide on it but not antibiotic ointment or other specific wound care.  Shes trying not to use her right hand, RHD.  Also has f/up appt next week, but cannot get a ride Last seen in July.  Since that time she had a fall and hip fx 8/24, with surgical repair on 8/25- admitted from 8/24 to 8/30 then [facility] from  07/09/2020 to  07/30/2020 She followed up with ortho, is off all pain meds and lovenox.  HTN - managed with toprol XL 50 mg  BP Readings from Last 3 Encounters:  08/16/20 118/74  08/08/20 (!) 169/86  07/09/20 (!) 147/77  hx of 1st degree AV block, HR in the 80's today, compliant with meds, tolerating well, denies any SE  HLD - on lipitor 10 mg every other day - recently tried to add back statin to zetia.  She did not tolerate 10 mg every other day in hospital and SNF, she has been able to take 5 mg every other day w/o bothersome SE. She has forgotten to take since she got home from rehab and cut her finger.  Prediabetes - labs recently checked in hospital after hyperglycemia Lab Results  Component Value Date   HGBA1C 5.8 (H) 07/03/2020   AR- taking flonase and claritin PRN - sx currently well controlled  Weight down a little over the past couple months  She lives by  herself with 73 y/o she's raising, currently getting rides until cleared to drive again. She reports good appetite, no intentional weight loss. Wt Readings from Last 5 Encounters:  08/16/20 119 lb 14.4 oz (54.4 kg)  08/08/20 125 lb (56.7 kg)  07/04/20 125 lb (56.7 kg)  05/23/20 125 lb 14.4 oz (57.1 kg)  05/10/20 124 lb 12.8 oz (56.6 kg)   BMI Readings from Last 5 Encounters:  08/16/20 19.35 kg/m  08/08/20 20.80 kg/m  07/04/20 20.80 kg/m  05/23/20 20.95 kg/m  05/10/20 20.77 kg/m   Hx of TIA - on lipitor and ASA Pt denies CP, SOB, exertional sx, LE edema, palpitation, Ha's, visual disturbances, lightheadedness, hypotension, syncope, myalgias, claudication      Patient Active Problem List   Diagnosis Date Noted  . Closed left hip fracture (HCC) 07/03/2020  . Hx of transient ischemic attack (TIA) 02/17/2019  . Peripheral neuropathy 08/17/2018  . Contracture of joint of finger of right hand 11/11/2017  . Osteopenia 09/08/2017  . Gallstone 12/16/2016  . Calcification of abdominal aorta (HCC) 12/16/2016  . Abdominal wall hernia 07/19/2015  . Allergic rhinitis, seasonal 07/19/2015  . Blood glucose elevated 07/19/2015  . Hypertension goal BP (blood pressure) < 140/90 07/19/2015  . Pure hypercholesterolemia 07/19/2015  . 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  .  Hyperlipidemia LDL goal <70 07/19/2015      Current Outpatient Medications:  .  aspirin 81 MG EC tablet, Take 81 mg by mouth daily. Swallow whole., Disp: , Rfl:  .  atorvastatin (LIPITOR) 10 MG tablet, Take 0.5 tablets (5 mg total) by mouth every other day., Disp: 45 tablet, Rfl: 3 .  ezetimibe (ZETIA) 10 MG tablet, Take 1 tablet (10 mg total) by mouth daily., Disp: 90 tablet, Rfl: 3 .  fluticasone (FLONASE) 50 MCG/ACT nasal spray, Place 2 sprays into both nostrils as needed., Disp: 1 g, Rfl: 11 .  loratadine (CLARITIN) 10 MG tablet, Take 1 tablet (10 mg total) by mouth daily as needed for allergies., Disp: 90 tablet, Rfl:  3 .  metoprolol succinate (TOPROL-XL) 50 MG 24 hr tablet, TAKE 1 TABLET BY MOUTH DAILY WITH OR IMMEDIATELY FOLLOWING A MEAL (Patient taking differently: Take 50 mg by mouth daily. ), Disp: 90 tablet, Rfl: 3 .  Multiple Vitamin (MULTI-VITAMINS) TABS, Take 1 tablet by mouth daily., Disp: , Rfl:    Allergies  Allergen Reactions  . Sulfa Antibiotics Shortness Of Breath  . Latex      Social History   Tobacco Use  . Smoking status: Never Smoker  . Smokeless tobacco: Never Used  . Tobacco comment: smoking cessation materials not required  Vaping Use  . Vaping Use: Never used  Substance Use Topics  . Alcohol use: No    Alcohol/week: 0.0 standard drinks  . Drug use: No      Chart Review Today: I personally reviewed active problem list, medication list, allergies, family history, social history, health maintenance, notes from last encounter, lab results, imaging with the patient/caregiver today.   Review of Systems 10 Systems reviewed and are negative for acute change except as noted in the HPI.     Objective:   Vitals:   08/16/20 1003  BP: 118/74  Pulse: 85  Resp: 16  Temp: 98.2 F (36.8 C)  TempSrc: Oral  SpO2: 99%  Weight: 119 lb 14.4 oz (54.4 kg)  Height: 5\' 6"  (1.676 m)    Body mass index is 19.35 kg/m.  Physical Exam Vitals and nursing note reviewed.  Constitutional:      General: She is not in acute distress.    Appearance: Normal appearance. She is not ill-appearing, toxic-appearing or diaphoretic.     Comments: Appears well, thin, pleasant, looks stated age, NAD  HENT:     Head: Normocephalic and atraumatic.     Right Ear: External ear normal.     Left Ear: External ear normal.  Eyes:     General:        Right eye: No discharge.        Left eye: No discharge.     Conjunctiva/sclera: Conjunctivae normal.  Cardiovascular:     Rate and Rhythm: Normal rate.     Pulses: Normal pulses.     Heart sounds: Normal heart sounds. No murmur heard.  No friction  rub. No gallop.   Pulmonary:     Effort: Pulmonary effort is normal. No respiratory distress.     Breath sounds: Normal breath sounds. No wheezing, rhonchi or rales.  Musculoskeletal:     Cervical back: Normal range of motion and neck supple.  Skin:    Comments: Sutured laceration to distal volar aspect of right middle finger - see picture right distal middle finger: 6 simple interrupted sutures intact upon presentation to clinic - visible location where 2 sutures had been but were  gone Tender, wound edges intact, mild surrounding edema, some scabbing, no erythema, induration, purulent drainage  Neurological:     Mental Status: She is alert. Mental status is at baseline.  Psychiatric:        Mood and Affect: Mood normal.        Behavior: Behavior normal.          Suture Removal  Date/Time: 08/16/2020 10:08 AM Performed by: Danelle Berry, PA-C Authorized by: Danelle Berry, PA-C  Body area: upper extremity Location details: right long finger Wound Appearance: clean (dry, intact) Patient tolerance: patient tolerated the procedure well with no immediate complications   right distal middle finger: 6 simple interrupted sutures intact upon presentation to clinic Tender, wound edges intact, mild surrounding edema, some scabbing, no erythema, induration, purulent drainage       Assessment & Plan:     ICD-10-CM   1. Essential hypertension  I10 COMPLETE METABOLIC PANEL WITH GFR   stable, well controlled, BP at goal, a little lower today, likely due to some weight loss, on metoprolol XL she feels good, no SE or concerns  2. Hyperlipidemia LDL goal <70  E78.5 atorvastatin (LIPITOR) 10 MG tablet    COMPLETE METABOLIC PANEL WITH GFR    Lipid panel   recheck labs today, continue zetia daily and lipitor 5 mg every other day (as much as tolerated by pt) continue diet/lifestyle efforts  3. Laceration of middle finger of right hand without complication, subsequent encounter  S61.212D  Suture Removal   healing well, wound care reviewed, no signs of infection  4. Encounter for removal of sutures  Z48.02 Suture Removal   pt tolerated suture removal, wound care and normal expected healing reviewed and written out for pt on AVS - annotated additionally for pt   5. Need for influenza vaccination  Z23 Flu Vaccine QUAD High Dose(Fluad)  6. Encounter for medication monitoring  Z51.81 CBC with Differential/Platelet    COMPLETE METABOLIC PANEL WITH GFR    Lipid panel  7. Seasonal allergic rhinitis, unspecified trigger  J30.2    well controlled with current meds prn  8. Encounter for examination following treatment at hospital  Z09    hospitalization and SNF/rehab reviewed, pt doing well, she has f/up with ortho and continues to do PT/OT with HH  9. Hx of transient ischemic attack (TIA)  Z86.73    on ASA and lipitor every other day  10. Osteopenia, unspecified location  M85.80 DG Bone Density   due for f/up dexa  11. History of left hip hemiarthroplasty  W46.659    07/04/2020 after fall with left femoral neck fx, surgery by Dr. Allena Katz, pt doing with [facility] PT/[facility]  12. Postmenopausal estrogen deficiency  Z78.0 DG Bone Density        Danelle Berry, PA-C 08/16/20 12:31 PM

## 2020-08-16 ENCOUNTER — Other Ambulatory Visit: Payer: Self-pay

## 2020-08-16 ENCOUNTER — Encounter: Payer: Self-pay | Admitting: Family Medicine

## 2020-08-16 ENCOUNTER — Ambulatory Visit (INDEPENDENT_AMBULATORY_CARE_PROVIDER_SITE_OTHER): Payer: PPO | Admitting: Family Medicine

## 2020-08-16 VITALS — BP 118/74 | HR 85 | Temp 98.2°F | Resp 16 | Ht 66.0 in | Wt 119.9 lb

## 2020-08-16 DIAGNOSIS — Z7982 Long term (current) use of aspirin: Secondary | ICD-10-CM | POA: Diagnosis not present

## 2020-08-16 DIAGNOSIS — Z09 Encounter for follow-up examination after completed treatment for conditions other than malignant neoplasm: Secondary | ICD-10-CM

## 2020-08-16 DIAGNOSIS — Z9181 History of falling: Secondary | ICD-10-CM | POA: Diagnosis not present

## 2020-08-16 DIAGNOSIS — M8580 Other specified disorders of bone density and structure, unspecified site: Secondary | ICD-10-CM

## 2020-08-16 DIAGNOSIS — Z23 Encounter for immunization: Secondary | ICD-10-CM

## 2020-08-16 DIAGNOSIS — J302 Other seasonal allergic rhinitis: Secondary | ICD-10-CM

## 2020-08-16 DIAGNOSIS — J3089 Other allergic rhinitis: Secondary | ICD-10-CM | POA: Diagnosis not present

## 2020-08-16 DIAGNOSIS — E785 Hyperlipidemia, unspecified: Secondary | ICD-10-CM | POA: Diagnosis not present

## 2020-08-16 DIAGNOSIS — Z7951 Long term (current) use of inhaled steroids: Secondary | ICD-10-CM | POA: Diagnosis not present

## 2020-08-16 DIAGNOSIS — S61212D Laceration without foreign body of right middle finger without damage to nail, subsequent encounter: Secondary | ICD-10-CM | POA: Diagnosis not present

## 2020-08-16 DIAGNOSIS — Z780 Asymptomatic menopausal state: Secondary | ICD-10-CM

## 2020-08-16 DIAGNOSIS — Z4802 Encounter for removal of sutures: Secondary | ICD-10-CM | POA: Diagnosis not present

## 2020-08-16 DIAGNOSIS — Z96642 Presence of left artificial hip joint: Secondary | ICD-10-CM | POA: Diagnosis not present

## 2020-08-16 DIAGNOSIS — Z8673 Personal history of transient ischemic attack (TIA), and cerebral infarction without residual deficits: Secondary | ICD-10-CM | POA: Diagnosis not present

## 2020-08-16 DIAGNOSIS — Z5181 Encounter for therapeutic drug level monitoring: Secondary | ICD-10-CM

## 2020-08-16 DIAGNOSIS — M810 Age-related osteoporosis without current pathological fracture: Secondary | ICD-10-CM | POA: Diagnosis not present

## 2020-08-16 DIAGNOSIS — Z79891 Long term (current) use of opiate analgesic: Secondary | ICD-10-CM | POA: Diagnosis not present

## 2020-08-16 DIAGNOSIS — I10 Essential (primary) hypertension: Secondary | ICD-10-CM | POA: Diagnosis not present

## 2020-08-16 DIAGNOSIS — S72002D Fracture of unspecified part of neck of left femur, subsequent encounter for closed fracture with routine healing: Secondary | ICD-10-CM | POA: Diagnosis not present

## 2020-08-16 DIAGNOSIS — E569 Vitamin deficiency, unspecified: Secondary | ICD-10-CM | POA: Diagnosis not present

## 2020-08-16 MED ORDER — ATORVASTATIN CALCIUM 10 MG PO TABS: 5.0000 mg | ORAL_TABLET | ORAL | 3 refills | Status: DC

## 2020-08-16 NOTE — Patient Instructions (Signed)
Continue zetia daily and lipitor 5 mg every other day or as much as you can tolerate  Keep taking your metoprolol   For your laceration on your finger keep covered with antibiotic ointment for the next week and please follow up with me if any worsening pain, swelling or redness.   Wound Care, Adult Taking care of your wound properly can help to prevent pain, infection, and scarring. It can also help your wound to heal more quickly. How to care for your wound Wound care      Follow instructions from your health care provider about how to take care of your wound. Make sure you: ? Wash your hands with soap and water before you change the bandage (dressing). If soap and water are not available, use hand sanitizer. ? Change your dressing as told by your health care provider. ? Leave stitches (sutures), skin glue, or adhesive strips in place. These skin closures may need to stay in place for 2 weeks or longer. If adhesive strip edges start to loosen and curl up, you may trim the loose edges. Do not remove adhesive strips completely unless your health care provider tells you to do that.  Check your wound area every day for signs of infection. Check for: ? Redness, swelling, or pain. ? Fluid or blood. ? Warmth. ? Pus or a bad smell.  Ask your health care provider if you should clean the wound with mild soap and water. Doing this may include: ? Using a clean towel to pat the wound dry after cleaning it. Do not rub or scrub the wound. ? Applying a cream or ointment. Do this only as told by your health care provider. ? Covering the incision with a clean dressing.  Ask your health care provider when you can leave the wound uncovered.  Keep the dressing dry until your health care provider says it can be removed. Do not take baths, swim, use a hot tub, or do anything that would put the wound underwater until your health care provider approves. Ask your health care provider if you can take showers.  You may only be allowed to take sponge baths. Medicines   If you were prescribed an antibiotic medicine, cream, or ointment, take or use the antibiotic as told by your health care provider. Do not stop taking or using the antibiotic even if your condition improves.  Take over-the-counter and prescription medicines only as told by your health care provider. If you were prescribed pain medicine, take it 30 or more minutes before you do any wound care or as told by your health care provider. General instructions  Return to your normal activities as told by your health care provider. Ask your health care provider what activities are safe.  Do not scratch or pick at the wound.  Do not use any products that contain nicotine or tobacco, such as cigarettes and e-cigarettes. These may delay wound healing. If you need help quitting, ask your health care provider.  Keep all follow-up visits as told by your health care provider. This is important.  Eat a diet that includes protein, vitamin A, vitamin C, and other nutrient-rich foods to help the wound heal. ? Foods rich in protein include meat, dairy, beans, nuts, and other sources. ? Foods rich in vitamin A include carrots and dark green, leafy vegetables. ? Foods rich in vitamin C include citrus, tomatoes, and other fruits and vegetables. ? Nutrient-rich foods have protein, carbohydrates, fat, vitamins, or minerals. Eat a  variety of healthy foods including vegetables, fruits, and whole grains. Contact a health care provider if:  You received a tetanus shot and you have swelling, severe pain, redness, or bleeding at the injection site.  Your pain is not controlled with medicine.  You have redness, swelling, or pain around the wound.  You have fluid or blood coming from the wound.  Your wound feels warm to the touch.  You have pus or a bad smell coming from the wound.  You have a fever or chills.  You are nauseous or you vomit.  You are  dizzy. Get help right away if:  You have a red streak going away from your wound.  The edges of the wound open up and separate.  Your wound is bleeding, and the bleeding does not stop with gentle pressure.  You have a rash.  You faint.  You have trouble breathing. Summary  Always wash your hands with soap and water before changing your bandage (dressing).  To help with healing, eat foods that are rich in protein, vitamin A, vitamin C, and other nutrients.  Check your wound every day for signs of infection. Contact your health care provider if you suspect that your wound is infected. This information is not intended to replace advice given to you by your health care provider. Make sure you discuss any questions you have with your health care provider. Document Revised: 02/14/2019 Document Reviewed: 05/13/2016 Elsevier Patient Education  2020 [employer].

## 2020-08-17 ENCOUNTER — Telehealth: Payer: Self-pay

## 2020-08-17 LAB — LIPID PANEL
Cholesterol: 188 mg/dL (ref <200)
HDL: 57 mg/dL (ref >=50)
LDL Cholesterol (Calc): 109 mg/dL (calc) — ABNORMAL HIGH
Non-HDL Cholesterol (Calc): 131 mg/dL (calc) — ABNORMAL HIGH (ref <130)
Total CHOL/HDL Ratio: 3.3 (calc) (ref <5.0)
Triglycerides: 108 mg/dL (ref <150)

## 2020-08-17 LAB — CBC WITH DIFFERENTIAL/PLATELET
Absolute Monocytes: 544 cells/uL (ref 200–950)
Basophils Absolute: 43 cells/uL (ref 0–200)
Basophils Relative: 0.5 %
Eosinophils Absolute: 111 cells/uL (ref 15–500)
Eosinophils Relative: 1.3 %
HCT: 39.9 % (ref 35.0–45.0)
Hemoglobin: 13.1 g/dL (ref 11.7–15.5)
Lymphs Abs: 2100 cells/uL (ref 850–3900)
MCH: 28.4 pg (ref 27.0–33.0)
MCHC: 32.8 g/dL (ref 32.0–36.0)
MCV: 86.4 fL (ref 80.0–100.0)
MPV: 9.2 fL (ref 7.5–12.5)
Monocytes Relative: 6.4 %
Neutro Abs: 5704 cells/uL (ref 1500–7800)
Neutrophils Relative %: 67.1 %
Platelets: 312 10*3/uL (ref 140–400)
RBC: 4.62 10*6/uL (ref 3.80–5.10)
RDW: 13 % (ref 11.0–15.0)
Total Lymphocyte: 24.7 %
WBC: 8.5 10*3/uL (ref 3.8–10.8)

## 2020-08-17 LAB — COMPLETE METABOLIC PANEL WITH GFR
AG Ratio: 1.5 (calc) (ref 1.0–2.5)
ALT: 12 U/L (ref 6–29)
AST: 19 U/L (ref 10–35)
Albumin: 4.3 g/dL (ref 3.6–5.1)
Alkaline phosphatase (APISO): 84 U/L (ref 37–153)
BUN: 8 mg/dL (ref 7–25)
CO2: 25 mmol/L (ref 20–32)
Calcium: 10 mg/dL (ref 8.6–10.4)
Chloride: 95 mmol/L — ABNORMAL LOW (ref 98–110)
Creat: 0.68 mg/dL (ref 0.60–0.93)
GFR, Est African American: 101 mL/min/{1.73_m2} (ref >=60)
GFR, Est Non African American: 87 mL/min/{1.73_m2} (ref >=60)
Globulin: 2.8 g/dL (calc) (ref 1.9–3.7)
Glucose, Bld: 103 mg/dL — ABNORMAL HIGH (ref 65–99)
Potassium: 4.6 mmol/L (ref 3.5–5.3)
Sodium: 133 mmol/L — ABNORMAL LOW (ref 135–146)
Total Bilirubin: 0.4 mg/dL (ref 0.2–1.2)
Total Protein: 7.1 g/dL (ref 6.1–8.1)

## 2020-08-17 NOTE — Chronic Care Management (AMB) (Signed)
  Chronic Care Management   Note   Name: Atlas Kuc MRN: [id number] DOB: 1946-07-17    Ms. Zwahlen requested outreach later this week. Anticipates being available on Friday October 8th.    Follow up plan: A member of the care management team will follow-up with Ms. Elgin on 08/17/20.   [facility] Care Management [facility] [phone]

## 2020-08-20 ENCOUNTER — Telehealth: Payer: Self-pay | Admitting: Family Medicine

## 2020-08-20 NOTE — Telephone Encounter (Incomplete)
Home Health Verbal Orders - Caller/Agency: Judeth Cornfield at [facility] Number: [phone]  Requesting OT/PT/Skilled Nursing/Social Work/Speech Therapy: ***OT to continue For 4 weeks at one time a week

## 2020-08-21 DIAGNOSIS — Z8781 Personal history of (healed) traumatic fracture: Secondary | ICD-10-CM | POA: Diagnosis not present

## 2020-08-21 DIAGNOSIS — Z9889 Other specified postprocedural states: Secondary | ICD-10-CM | POA: Diagnosis not present

## 2020-08-21 NOTE — Telephone Encounter (Signed)
Verbal order given, I cant close chart?

## 2020-08-23 ENCOUNTER — Ambulatory Visit: Payer: PPO | Admitting: Family Medicine

## 2020-08-27 ENCOUNTER — Ambulatory Visit: Payer: Self-pay | Admitting: *Deleted

## 2020-08-27 NOTE — Telephone Encounter (Signed)
Occupational therapist is at patient's home and she is calling to report elevated BP- patient is not having symptoms- although she states she is having a dull headache today. Patient needs appointment- but does not have transportation and does not know if she can find a ride- advised she needs to be seen- patient has scheduled afternnoon appointment on Wednesday so she can arrange a ride. Patient advised to go to ED for neurological/cardiac symptoms.  Reason for Disposition  Systolic BP  >= 180 OR Diastolic >= 110  Answer Assessment - Initial Assessment Questions 1. BLOOD PRESSURE: "What is the blood pressure?" "Did you take at least two measurements 5 minutes apart?"     184/92, 184/98 2. ONSET: "When did you take your blood pressure?"     12:30, 12:40 3. HOW: "How did you obtain the blood pressure?" (e.g., visiting nurse, automatic home BP monitor)     Automatic cuff, manual cuff 4. HISTORY: "Do you have a history of high blood pressure?"     yes 5. MEDICATIONS: "Are you taking any medications for blood pressure?" "Have you missed any doses recently?"     Yes- no missed doses 6. OTHER SYMPTOMS: "Do you have any symptoms?" (e.g., headache, chest pain, blurred vision, difficulty breathing, weakness)     Dull headache 7. PREGNANCY: "Is there any chance you are pregnant?" "When was your last menstrual period?"     n/a  Protocols used: BLOOD PRESSURE - HIGH-A-AH

## 2020-08-27 NOTE — Telephone Encounter (Signed)
Called patient and she and the occupational therapist said that they were trying to get her transportation no to cancel that they would call back on the day before if no transportation.

## 2020-08-29 ENCOUNTER — Ambulatory Visit: Payer: Self-pay | Admitting: Internal Medicine

## 2020-08-30 DIAGNOSIS — Z1211 Encounter for screening for malignant neoplasm of colon: Secondary | ICD-10-CM | POA: Diagnosis not present

## 2020-09-05 ENCOUNTER — Encounter: Payer: Self-pay | Admitting: Family Medicine

## 2020-09-07 ENCOUNTER — Telehealth: Payer: Self-pay

## 2020-09-07 DIAGNOSIS — E785 Hyperlipidemia, unspecified: Secondary | ICD-10-CM | POA: Diagnosis not present

## 2020-09-07 DIAGNOSIS — I10 Essential (primary) hypertension: Secondary | ICD-10-CM | POA: Diagnosis not present

## 2020-09-07 DIAGNOSIS — M810 Age-related osteoporosis without current pathological fracture: Secondary | ICD-10-CM | POA: Diagnosis not present

## 2020-09-07 DIAGNOSIS — Z9181 History of falling: Secondary | ICD-10-CM | POA: Diagnosis not present

## 2020-09-07 DIAGNOSIS — E569 Vitamin deficiency, unspecified: Secondary | ICD-10-CM | POA: Diagnosis not present

## 2020-09-07 DIAGNOSIS — S72002D Fracture of unspecified part of neck of left femur, subsequent encounter for closed fracture with routine healing: Secondary | ICD-10-CM | POA: Diagnosis not present

## 2020-09-07 DIAGNOSIS — Z79891 Long term (current) use of opiate analgesic: Secondary | ICD-10-CM | POA: Diagnosis not present

## 2020-09-07 DIAGNOSIS — Z96642 Presence of left artificial hip joint: Secondary | ICD-10-CM | POA: Diagnosis not present

## 2020-09-07 DIAGNOSIS — J3089 Other allergic rhinitis: Secondary | ICD-10-CM | POA: Diagnosis not present

## 2020-09-07 DIAGNOSIS — Z7982 Long term (current) use of aspirin: Secondary | ICD-10-CM | POA: Diagnosis not present

## 2020-09-07 DIAGNOSIS — Z7951 Long term (current) use of inhaled steroids: Secondary | ICD-10-CM | POA: Diagnosis not present

## 2020-09-07 NOTE — Telephone Encounter (Signed)
Copied from CRM [id number]. Topic: General - Inquiry >> Sep 07, 2020  2:08 PM Adrian Prince D wrote: Reason for CRM: [facility] home health nurse(Tonya) called to report a elevated blood pressure, it's 164/94. She has been having a lot of elevated blood pressures and she had a appointment last week but was unable to attend due to transportation issues. Archie Patten can be reached at [phone].Please advise

## 2020-09-07 NOTE — Telephone Encounter (Signed)
Care worker stated patient do not drive and know family to bring her to appointments. Patient is notified to do a virtual appointment on 11/1 at 11:20am and to keep log of blood pressure. She verbalized understanding to go to ED or UC if blood pressure stays elevated or she have any other symptoms.

## 2020-09-07 NOTE — Telephone Encounter (Signed)
Please advise 

## 2020-09-10 ENCOUNTER — Encounter: Payer: Self-pay | Admitting: Family Medicine

## 2020-09-10 ENCOUNTER — Other Ambulatory Visit: Payer: Self-pay

## 2020-09-10 ENCOUNTER — Ambulatory Visit (INDEPENDENT_AMBULATORY_CARE_PROVIDER_SITE_OTHER): Payer: PPO | Admitting: Family Medicine

## 2020-09-10 VITALS — BP 167/84

## 2020-09-10 DIAGNOSIS — M25552 Pain in left hip: Secondary | ICD-10-CM

## 2020-09-10 DIAGNOSIS — I10 Essential (primary) hypertension: Secondary | ICD-10-CM

## 2020-09-10 DIAGNOSIS — F419 Anxiety disorder, unspecified: Secondary | ICD-10-CM | POA: Diagnosis not present

## 2020-09-10 DIAGNOSIS — Z748 Other problems related to care provider dependency: Secondary | ICD-10-CM

## 2020-09-10 NOTE — Progress Notes (Signed)
Name: Laura Deleon   MRN: [id number]    DOB: 07-28-1946   Date:09/10/2020       Progress Note  Subjective:   I connected with  Laura Deleon  on 09/10/20 at 11:20 AM EDT by a video enabled telemedicine application and verified that I am speaking with the correct person using two identifiers.  I discussed the limitations of evaluation and management by telemedicine and the availability of in person appointments. The patient expressed understanding and agreed to proceed. Staff also discussed with the patient that there may be a patient responsible charge related to this service. Patient Location: at daughters home Provider Location: [facility] clinic Additional Individuals present: none  Chief Complaint  Patient presents with  . Hypertension    152/80 on Saturday 158/93 Sunday   Laura Deleon is a 74 y.o. female, presents for virtual visit for routine follow up on the conditions listed above.  Her BP has been high at home, kids have been arguing, she has a little more pain with weather changing and now she is very upset and anxious and shaky  She started to go to her daughters house to not be alone - there when she started to feel relaxed she noticed her BP did go back down.  The cuff doesn't seem to work right, just going to check her BP makes her nervous. Readings 150's - 160's  Hypertension:  Currently managed on metoprolol - last OV here BP was well controlled, recently pt has high readings at home, she also feels nervous being home alone after fall/hipfx/going to rehab and now back home. Pt reports good med compliance and denies any SE.   BP Readings from Last 3 Encounters:  09/10/20 (!) 167/84  08/16/20 118/74  08/08/20 (!) 169/86  Pt denies CP, SOB, exertional sx, LE edema, palpitation, Ha's, visual disturbances, lightheadedness, hypotension, syncope.  Weightloss: She had lost weight over the past couple months - after coming out of rehab and getting back home. She  lost a little more weight from last time in office - with her nerves shes not eating a lot, sometimes she doesn't have what she wants to she's eating less. Wt Readings from Last 5 Encounters:  08/16/20 119 lb 14.4 oz (54.4 kg)  08/08/20 125 lb (56.7 kg)  07/04/20 125 lb (56.7 kg)  05/23/20 125 lb 14.4 oz (57.1 kg)  05/10/20 124 lb 12.8 oz (56.6 kg)   BMI Readings from Last 5 Encounters:  08/16/20 19.35 kg/m  08/08/20 20.80 kg/m  07/04/20 20.80 kg/m  05/23/20 20.95 kg/m  05/10/20 20.77 kg/m      Patient Active Problem List   Diagnosis Date Noted  . Closed left hip fracture (HCC) 07/03/2020  . Hx of transient ischemic attack (TIA) 02/17/2019  . Peripheral neuropathy 08/17/2018  . Contracture of joint of finger of right hand 11/11/2017  . Osteopenia 09/08/2017  . Gallstone 12/16/2016  . Calcification of abdominal aorta (HCC) 12/16/2016  . Abdominal wall hernia 07/19/2015  . Allergic rhinitis, seasonal 07/19/2015  . Blood glucose elevated 07/19/2015  . Hypertension goal BP (blood pressure) < 140/90 07/19/2015  . Pure hypercholesterolemia 07/19/2015  . 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  . Hyperlipidemia LDL goal <70 07/19/2015    Current Outpatient Medications:  .  aspirin 81 MG EC tablet, Take 81 mg by mouth daily. Swallow whole., Disp: , Rfl:  .  atorvastatin (LIPITOR) 10 MG tablet, Take 0.5 tablets (5 mg total) by mouth every other day., Disp: 45  tablet, Rfl: 3 .  ezetimibe (ZETIA) 10 MG tablet, Take 1 tablet (10 mg total) by mouth daily., Disp: 90 tablet, Rfl: 3 .  fluticasone (FLONASE) 50 MCG/ACT nasal spray, Place 2 sprays into both nostrils as needed., Disp: 1 g, Rfl: 11 .  loratadine (CLARITIN) 10 MG tablet, Take 1 tablet (10 mg total) by mouth daily as needed for allergies., Disp: 90 tablet, Rfl: 3 .  metoprolol succinate (TOPROL-XL) 50 MG 24 hr tablet, TAKE 1 TABLET BY MOUTH DAILY WITH OR IMMEDIATELY FOLLOWING A MEAL (Patient taking differently: Take 50 mg by mouth  daily. ), Disp: 90 tablet, Rfl: 3 .  Multiple Vitamin (MULTI-VITAMINS) TABS, Take 1 tablet by mouth daily., Disp: , Rfl:  Allergies  Allergen Reactions  . Sulfa Antibiotics Shortness Of Breath  . Latex     Past Surgical History:  Procedure Laterality Date  . ABDOMINAL HYSTERECTOMY    . BLADDER SURGERY    . HAND SURGERY  11/14/2016   removal of foreign body  . HERNIA REPAIR     with mesh  . HIP ARTHROPLASTY Left 07/04/2020   Procedure: ARTHROPLASTY BIPOLAR HIP (HEMIARTHROPLASTY);  Surgeon: Signa Kell, MD;  Location: [facility] ORS;  Service: Orthopedics;  Laterality: Left;   Family History  Problem Relation Age of Onset  . Hyperlipidemia Mother   . Hypertension Mother   . Hyperlipidemia Father   . Emphysema Father   . COPD Father   . Breast cancer Neg Hx    Social History   Socioeconomic History  . Marital status: Widowed    Spouse name: Faylene Million  . Number of children: 2  . Years of education: Not on file  . Highest education level: 12th grade  Occupational History  . Occupation: Retired  Tobacco Use  . Smoking status: Never Smoker  . Smokeless tobacco: Never Used  . Tobacco comment: smoking cessation materials not required  Vaping Use  . Vaping Use: Never used  Substance and Sexual Activity  . Alcohol use: No    Alcohol/week: 0.0 standard drinks  . Drug use: No  . Sexual activity: Not Currently  Other Topics Concern  . Not on file  Social History Narrative   Husband passed away March 09, 2016, she is raising her 110 year old great grandson   Social Determinants of Corporate investment banker Strain: Low Risk   . Difficulty of Paying Living Expenses: Not very hard  Food Insecurity: No Food Insecurity  . Worried About Programme researcher, broadcasting/film/video in the Last Year: Never true  . Ran Out of Food in the Last Year: Never true  Transportation Needs: No Transportation Needs  . Lack of Transportation (Medical): No  . Lack of Transportation (Non-Medical): No  Physical Activity: Inactive  .  Days of Exercise per Week: 0 days  . Minutes of Exercise per Session: 0 min  Stress: Stress Concern Present  . Feeling of Stress : To some extent  Social Connections: Moderately Isolated  . Frequency of Communication with Friends and Family: More than three times a week  . Frequency of Social Gatherings with Friends and Family: Three times a week  . Attends Religious Services: More than 4 times per year  . Active Member of Clubs or Organizations: No  . Attends Banker Meetings: Never  . Marital Status: Widowed  Intimate Partner Violence: Not At Risk  . Fear of Current or Ex-Partner: No  . Emotionally Abused: No  . Physically Abused: No  . Sexually Abused: No  Chart Review Today: I personally reviewed active problem list, medication list, allergies, family history, social history, health maintenance, notes from last encounter, lab results, imaging with the patient/caregiver today.   Review of Systems  10 Systems reviewed and are negative for acute change except as noted in the HPI.   Objective:    Virtual encounter, vitals limited, only able to obtain the following Today's Vitals   09/10/20 1052  BP: (!) 167/84   There is no height or weight on file to calculate BMI. Nursing Note and Vital Signs reviewed.  Physical Exam Pt alert, pleasant, slightly anxious sounding, phonation clear, answering questions appropriately PE limited by telephone encounter  No results found for this or any previous visit (from the past 72 hour(s)).  PHQ2/9: Depression screen [facility] 2/9 09/10/2020 08/16/2020 05/10/2020 11/22/2019 05/11/2019  Decreased Interest 0 0 0 0 0  Down, Depressed, Hopeless 0 0 1 0 0  PHQ - 2 Score 0 0 1 0 0  Altered sleeping - - 2 0 0  Tired, decreased energy - - 0 1 0  Change in appetite - - 1 1 0  Feeling bad or failure about yourself  - - 0 0 0  Trouble concentrating - - 0 0 0  Moving slowly or fidgety/restless - - 1 0 0  Suicidal thoughts - - 0 0 0  PHQ-9  Score - - 5 2 0  Difficult doing work/chores - - Not difficult at all Somewhat difficult Not difficult at all  Some recent data might be hidden   PHQ-2/9 Result is neg, reviewed  Fall Risk: Fall Risk  09/10/2020 08/16/2020 05/23/2020 05/10/2020 11/22/2019  Falls in the past year? 0 1 0 0 1  Number falls in past yr: 0 1 0 0 0  Injury with Fall? 0 1 0 0 0  Risk for fall due to : - - - No Fall Risks -  Risk for fall due to: Comment - - - - -  Follow up Falls evaluation completed Falls evaluation completed - Falls prevention discussed -     Assessment and Plan:     ICD-10-CM   1. Essential hypertension  I10    elevated with home readings, anxious and in pain, suspect home cuff is falsely elevated and addiitonally having anxiety and stress, f/up in office BP nurse ck  2. Anxiety  F41.9 Ambulatory referral to Chronic Care Management Services   abouth health, a lot of changes, anxious at home, alone a lot and feels nervous and anxious when alone  3. Assistance needed with transportation  Z74.8 Ambulatory referral to Chronic Care Management Services   difficulty getting rides for appts, will need to come to office for nurse BP recheck  4. Left hip pain  M25.552    left hip pain - post-op, occuring with weather changes, pt taking OTC tylenol to manage, likely contributing to BP elevation, still improving mobility   I suspect pt is having falsly elevated BP readings at home, this is causing stress and further elevating BP. No concerning sx - we reviewed concerning cardiac sx Pt advised to stop monitoring BP at home, come into clinic when she can for Nurse BP recheck OV a few weeks ago HTN was stable, BP at goal, very well controlled and I have no concerns right now  Refer to CCM SW for assistance with dealing with stress/isolation/anxiety and help with rides  I discussed the assessment and treatment plan with the patient. The patient was provided an opportunity  to ask questions and all were  answered. The patient agreed with the plan and demonstrated an understanding of the instructions.  The patient was advised to call back or seek an in-person evaluation if the symptoms worsen or if the condition fails to improve as anticipated.  I provided 30+ minutes of non-face-to-face time during this encounter.  More than 18 min spent on the phone with pt today.  Danelle Berry, PA-C 09/10/20 10:58 AM

## 2020-09-11 ENCOUNTER — Telehealth: Payer: PPO | Admitting: Family Medicine

## 2020-09-11 ENCOUNTER — Telehealth: Payer: Self-pay | Admitting: *Deleted

## 2020-09-11 NOTE — Chronic Care Management (AMB) (Signed)
  Chronic Care Management   Note  09/11/2020 Name: Laura Deleon MRN: [id number] DOB: Sep 22, 1946  Laura Deleon is a 74 y.o. year old female who is a primary care patient of Danelle Berry, [place]. Laura Deleon is currently enrolled in care management services. An additional referral for LCSW  was placed.   Follow up plan: Unsuccessful telephone outreach attempt made. A HIPAA compliant phone message was left for the patient providing contact information and requesting a return call.  The care management team will reach out to the patient again over the next 7 days.  If patient returns call to provider office, please advise to call Embedded Care Management Care Guide Gwenevere Ghazi at [phone].  Gwenevere Ghazi  Care Guide, Embedded Care Coordination [facility] Management

## 2020-09-13 DIAGNOSIS — Z9181 History of falling: Secondary | ICD-10-CM | POA: Diagnosis not present

## 2020-09-13 DIAGNOSIS — Z7951 Long term (current) use of inhaled steroids: Secondary | ICD-10-CM | POA: Diagnosis not present

## 2020-09-13 DIAGNOSIS — S72002D Fracture of unspecified part of neck of left femur, subsequent encounter for closed fracture with routine healing: Secondary | ICD-10-CM | POA: Diagnosis not present

## 2020-09-13 DIAGNOSIS — Z7982 Long term (current) use of aspirin: Secondary | ICD-10-CM | POA: Diagnosis not present

## 2020-09-13 DIAGNOSIS — I10 Essential (primary) hypertension: Secondary | ICD-10-CM | POA: Diagnosis not present

## 2020-09-13 DIAGNOSIS — Z96642 Presence of left artificial hip joint: Secondary | ICD-10-CM | POA: Diagnosis not present

## 2020-09-13 DIAGNOSIS — M810 Age-related osteoporosis without current pathological fracture: Secondary | ICD-10-CM | POA: Diagnosis not present

## 2020-09-13 DIAGNOSIS — J3089 Other allergic rhinitis: Secondary | ICD-10-CM | POA: Diagnosis not present

## 2020-09-13 DIAGNOSIS — E569 Vitamin deficiency, unspecified: Secondary | ICD-10-CM | POA: Diagnosis not present

## 2020-09-13 DIAGNOSIS — Z79891 Long term (current) use of opiate analgesic: Secondary | ICD-10-CM | POA: Diagnosis not present

## 2020-09-13 DIAGNOSIS — E785 Hyperlipidemia, unspecified: Secondary | ICD-10-CM | POA: Diagnosis not present

## 2020-09-14 NOTE — Chronic Care Management (AMB) (Signed)
  Chronic Care Management   Note  09/14/2020 Name: Laura Deleon MRN: [id number] DOB: 12-23-1945  Laura Deleon is a 74 y.o. year old female who is a primary care patient of Delsa Grana, [place]. I reached out to Beryle Lathe by phone today in response to a referral sent by [name] PCP, Delsa Grana, PA-C.     Laura Deleon was given information about Chronic Care Management services today including:  1. CCM service includes personalized support from designated clinical staff supervised by her physician, including individualized plan of care and coordination with other care providers 2. 24/7 contact phone numbers for assistance for urgent and routine care needs. 3. Service will only be billed when office clinical staff spend 20 minutes or more in a month to coordinate care. 4. Only one practitioner may furnish and bill the service in a calendar month. 5. The patient may stop CCM services at any time (effective at the end of the month) by phone call to the office staff. 6. The patient will be responsible for cost sharing (co-pay) of up to 20% of the service fee (after annual deductible is met).  Patient agreed to services and verbal consent obtained.   Follow up plan: Telephone appointment with care management team member scheduled for:09/19/2020  [facility]  Direct Dial: [phone]

## 2020-09-19 ENCOUNTER — Ambulatory Visit: Payer: PPO | Admitting: *Deleted

## 2020-09-19 DIAGNOSIS — F419 Anxiety disorder, unspecified: Secondary | ICD-10-CM

## 2020-09-19 DIAGNOSIS — I10 Essential (primary) hypertension: Secondary | ICD-10-CM

## 2020-09-20 NOTE — Patient Instructions (Addendum)
Thank you allowing the Chronic Care Management Team to be a part of your care! It was a pleasure speaking with you today!  1. Please call this social worker with any questions or concerns regarding your mental health needs  CCM (Chronic Care Management) Team   Juanell Fairly RN, BSN Nurse Care Coordinator  [phone]  Amario Longmore [address] LCSW Clinical Social Worker [phone]  Goals Addressed              This Visit's Progress   .  "I think that I am a little lonely and anxious" (pt-stated)        Follow Up Date 10/03/2020   - begin personal counseling - talk about feelings with a friend, family or spiritual advisor - practice positive thinking and self-talk   Current Barriers:  Marland Kitchen Mental Health Concerns   Interventions: . Inter-disciplinary care team collaboration (see longitudinal plan of care) . Patient interviewed and appropriate assessments performed . Patient discussed feelings of depression and difficulty dealing with the isolation due to the pandemic . Supportive Counseling provided, positive coping strategies discussed . Explored possible community resources to increase involvement  . Discussed plans with patient for ongoing care management follow up and provided patient with direct contact information for care management team   Patient Self Care Activities:  . Patient verbalizes understanding of plan to utilize positive coping strategies discussed  .    Initial goal documentation      .  Find Help in My Community          The patient verbalized understanding of instructions provided today and declined a print copy of patient instruction materials.   Telephone follow up appointment with care management team member scheduled for: 09/26/20

## 2020-09-20 NOTE — Chronic Care Management (AMB) (Signed)
  Chronic Care Management    Clinical Social Work Follow Up Note  09/20/2020 Name: Hiawatha Dressel MRN: [id number] DOB: [birth year]  Alen Blew Delph is a 74 y.o. year old female who is a primary care patient of Danelle Berry, [place]. The CCM team was consulted for assistance with Mental Health Counseling and Resources.   Review of patient status, including review of consultants reports, other relevant assessments, and collaboration with appropriate care team members and the patient's provider was performed as part of comprehensive patient evaluation and provision of chronic care management services.    SDOH (Social Determinants of Health) assessments performed: No    Outpatient Encounter Medications as of 09/19/2020  Medication Sig  . aspirin 81 MG EC tablet Take 81 mg by mouth daily. Swallow whole.  Marland Kitchen atorvastatin (LIPITOR) 10 MG tablet Take 0.5 tablets (5 mg total) by mouth every other day.  . ezetimibe (ZETIA) 10 MG tablet Take 1 tablet (10 mg total) by mouth daily.  . fluticasone (FLONASE) 50 MCG/ACT nasal spray Place 2 sprays into both nostrils as needed.  . loratadine (CLARITIN) 10 MG tablet Take 1 tablet (10 mg total) by mouth daily as needed for allergies.  . metoprolol succinate (TOPROL-XL) 50 MG 24 hr tablet TAKE 1 TABLET BY MOUTH DAILY WITH OR IMMEDIATELY FOLLOWING A MEAL (Patient taking differently: Take 50 mg by mouth daily. )  . Multiple Vitamin (MULTI-VITAMINS) TABS Take 1 tablet by mouth daily.   No facility-administered encounter medications on file as of 09/19/2020.     Goals Addressed              This Visit's Progress   .  "I think that I am a little lonely and anxious" (pt-stated)        Follow Up Date 10/03/2020   - begin personal counseling - talk about feelings with a friend, family or spiritual advisor - practice positive thinking and self-talk   Current Barriers:  Marland Kitchen Mental Health Concerns   Interventions: . Inter-disciplinary care team  collaboration (see longitudinal plan of care) . Patient interviewed and appropriate assessments performed . Patient discussed feelings of depression and difficulty dealing with the isolation due to the pandemic . Supportive Counseling provided, positive coping strategies discussed . Explored possible community resources to increase involvement  . Discussed plans with patient for ongoing care management follow up and provided patient with direct contact information for care management team   Patient Self Care Activities:  . Patient verbalizes understanding of plan to utilize positive coping strategies discussed  .    Initial goal documentation      .  Find Help in My Community          Follow Up Plan: SW will follow up with patient by phone over the next 7-14 business days   Markice Torbert, [place] Clinical Social Worker  [facility] [phone]

## 2020-09-26 ENCOUNTER — Ambulatory Visit: Payer: Self-pay | Admitting: *Deleted

## 2020-09-26 DIAGNOSIS — I10 Essential (primary) hypertension: Secondary | ICD-10-CM

## 2020-09-26 DIAGNOSIS — F419 Anxiety disorder, unspecified: Secondary | ICD-10-CM

## 2020-09-27 NOTE — Patient Instructions (Signed)
Thank you allowing the Chronic Care Management Team to be a part of your care! It was a pleasure speaking with you today!  1. Please call this social worker with any questions or concerns regarding your mental health or community resource needs  CCM (Chronic Care Management) Team   Juanell Fairly RN, BSN Nurse Care Coordinator  [phone]  Teshara Moree [address], LCSW Clinical Social Worker [phone]  Goals Addressed              This Visit's Progress   .  "I think that I am a little lonely and anxious" (pt-stated)        Follow Up Date 10/03/2020   - begin personal counseling - talk about feelings with a friend, family or spiritual advisor - practice positive thinking and self-talk   Current Barriers:  Marland Kitchen Mental Health Concerns   Interventions: . Patient interviewed and appropriate assessments performed . Patient discussed continued feelings of depression and difficulty dealing with the isolation due to the pandemic . Patient discussed increased isolation and discussed desire to increase activity in the community . Supportive Counseling provided, positive coping strategies discussed . Explored possible community resources to increase involvement including the [employer] [facility] . Discussed plans with patient for ongoing care management follow up and provided patient with direct contact information for care management team   Patient Self Care Activities:  . Patient verbalizes understanding of plan to utilize positive coping strategies discussed  .    Initial goal documentation          The patient verbalized understanding of instructions, educational materials, and care plan provided today and declined offer to receive copy of patient instructions, educational materials, and care plan.   Telephone follow up appointment with care management team member scheduled for:10/10/20

## 2020-09-27 NOTE — Chronic Care Management (AMB) (Signed)
  Chronic Care Management    Clinical Social Work Follow Up Note  09/27/2020 Name: Laura Deleon MRN: [id number] DOB: [birth year]  Laura Deleon is a 74 y.o. year old female who is a primary care patient of Danelle Berry, [place]. The CCM team was consulted for assistance with Mental Health Counseling and Resources.   Review of patient status, including review of consultants reports, other relevant assessments, and collaboration with appropriate care team members and the patient's provider was performed as part of comprehensive patient evaluation and provision of chronic care management services.    SDOH (Social Determinants of Health) assessments performed: No    Outpatient Encounter Medications as of 09/26/2020  Medication Sig  . aspirin 81 MG EC tablet Take 81 mg by mouth daily. Swallow whole.  Marland Kitchen atorvastatin (LIPITOR) 10 MG tablet Take 0.5 tablets (5 mg total) by mouth every other day.  . ezetimibe (ZETIA) 10 MG tablet Take 1 tablet (10 mg total) by mouth daily.  . fluticasone (FLONASE) 50 MCG/ACT nasal spray Place 2 sprays into both nostrils as needed.  . loratadine (CLARITIN) 10 MG tablet Take 1 tablet (10 mg total) by mouth daily as needed for allergies.  . metoprolol succinate (TOPROL-XL) 50 MG 24 hr tablet TAKE 1 TABLET BY MOUTH DAILY WITH OR IMMEDIATELY FOLLOWING A MEAL (Patient taking differently: Take 50 mg by mouth daily. )  . Multiple Vitamin (MULTI-VITAMINS) TABS Take 1 tablet by mouth daily.   No facility-administered encounter medications on file as of 09/26/2020.     Goals Addressed              This Visit's Progress   .  "I think that I am a little lonely and anxious" (pt-stated)        Follow Up Date 10/03/2020   - begin personal counseling - talk about feelings with a friend, family or spiritual advisor - practice positive thinking and self-talk   Current Barriers:  Marland Kitchen Mental Health Concerns   Interventions: . Patient interviewed and appropriate  assessments performed . Patient discussed continued feelings of depression and difficulty dealing with the isolation due to the pandemic . Patient discussed increased isolation and discussed desire to increase activity in the community . Supportive Counseling provided, positive coping strategies discussed . Explored possible community resources to increase involvement including the [employer] [facility] . Discussed plans with patient for ongoing care management follow up and provided patient with direct contact information for care management team   Patient Self Care Activities:  . Patient verbalizes understanding of plan to utilize positive coping strategies discussed  .    Initial goal documentation          Follow Up Plan: SW will follow up with patient by phone over the next 7-14 business days   Bran Aldridge, [place] Clinical Social Worker  [facility] [phone]

## 2020-10-10 ENCOUNTER — Telehealth: Payer: Self-pay

## 2020-10-11 ENCOUNTER — Telehealth: Payer: Self-pay | Admitting: *Deleted

## 2020-10-11 NOTE — Telephone Encounter (Signed)
   Chronic Care Management   Unsuccessful Call Note 10/11/2020 Name: Drea Jurewicz MRN: [id number] DOB: [birth year]  Zendayah Hardgrave  is a 74 year old female who sees Danelle Berry, PA-C for primary care. Danelle Berry, PA-C asked the CCM team to consult the patient for Mental Health counseling and Resources.    This social worker was unable to reach patient via telephone today for follow up call. I have left HIPAA compliant voicemail asking patient to return my call. (unsuccessful outreach #1).   Plan: Will follow-up within 7 business days via telephone.     Verna Czech, LCSW [facility] [phone]

## 2020-10-15 ENCOUNTER — Telehealth: Payer: Self-pay | Admitting: *Deleted

## 2020-10-15 NOTE — Telephone Encounter (Signed)
   Chronic Care Management   Unsuccessful Call Note 10/15/2020 Name: Laura Deleon MRN: [id number] DOB: 12-15-1945  Patient  is a 74 year old female who sees  Danelle Berry, [place] for primary care. Danelle Berry PA-C asked the CCM team to consult the patient for Mental Health Counseling and Resources.    This social worker was unable to reach patient via telephone today for follow up call. I have left HIPAA compliant voicemail asking patient to return my call. (unsuccessful outreach #2).   Plan: Will follow-up within 7 business days via telephone.     Laura Czech, LCSW [facility] [phone]

## 2020-12-11 ENCOUNTER — Other Ambulatory Visit: Payer: Self-pay

## 2020-12-11 MED ORDER — EZETIMIBE 10 MG PO TABS: 10.0000 mg | ORAL_TABLET | Freq: Every day | ORAL | 0 refills | Status: DC

## 2020-12-17 ENCOUNTER — Ambulatory Visit: Payer: PPO | Admitting: Family Medicine

## 2021-03-06 ENCOUNTER — Other Ambulatory Visit: Payer: Self-pay | Admitting: Family Medicine

## 2021-03-06 NOTE — Telephone Encounter (Signed)
Pt needs to schedule an appt

## 2021-03-06 NOTE — Telephone Encounter (Signed)
Last seen by Leisa on 11.1.2021 Next scheduled with Gabriel Cirri on 5.17.2022  Only have 7 pills left on ezetimibe and 8 left of the metroprolol, Asking if you would give her enough until her scheduled appt. We are completely booked until the 17th

## 2021-03-26 ENCOUNTER — Ambulatory Visit (INDEPENDENT_AMBULATORY_CARE_PROVIDER_SITE_OTHER): Payer: PPO | Admitting: Unknown Physician Specialty

## 2021-03-26 ENCOUNTER — Other Ambulatory Visit: Payer: Self-pay

## 2021-03-26 ENCOUNTER — Encounter: Payer: Self-pay | Admitting: Unknown Physician Specialty

## 2021-03-26 VITALS — BP 138/84 | HR 96 | Temp 98.4°F | Resp 14 | Ht 66.0 in | Wt 115.3 lb

## 2021-03-26 DIAGNOSIS — M8000XD Age-related osteoporosis with current pathological fracture, unspecified site, subsequent encounter for fracture with routine healing: Secondary | ICD-10-CM | POA: Diagnosis not present

## 2021-03-26 DIAGNOSIS — E785 Hyperlipidemia, unspecified: Secondary | ICD-10-CM

## 2021-03-26 DIAGNOSIS — M80059D Age-related osteoporosis with current pathological fracture, unspecified femur, subsequent encounter for fracture with routine healing: Secondary | ICD-10-CM

## 2021-03-26 DIAGNOSIS — R636 Underweight: Secondary | ICD-10-CM | POA: Insufficient documentation

## 2021-03-26 DIAGNOSIS — I10 Essential (primary) hypertension: Secondary | ICD-10-CM | POA: Diagnosis not present

## 2021-03-26 DIAGNOSIS — Z0100 Encounter for examination of eyes and vision without abnormal findings: Secondary | ICD-10-CM

## 2021-03-26 DIAGNOSIS — M8000XA Age-related osteoporosis with current pathological fracture, unspecified site, initial encounter for fracture: Secondary | ICD-10-CM | POA: Insufficient documentation

## 2021-03-26 HISTORY — DX: Age-related osteoporosis with current pathological fracture, unspecified femur, subsequent encounter for fracture with routine healing: M80.059D

## 2021-03-26 MED ORDER — METOPROLOL SUCCINATE ER 50 MG PO TB24
50.0000 mg | ORAL_TABLET | Freq: Every day | ORAL | 1 refills | Status: DC
Start: 2021-03-26 — End: 2021-11-06

## 2021-03-26 MED ORDER — EZETIMIBE 10 MG PO TABS
10.0000 mg | ORAL_TABLET | Freq: Every day | ORAL | 0 refills | Status: DC
Start: 2021-03-26 — End: 2021-06-04

## 2021-03-26 NOTE — Assessment & Plan Note (Signed)
Check labs on only Zetia.  If LDL is above 70, restart at a lower dose

## 2021-03-26 NOTE — Assessment & Plan Note (Signed)
Pt with a lot of stress with caregiving for a great grandchild. Watching her diet but is perhaps too strict.  Discussed higher calorie meals.  May need to see nutritionist.  Check labs today

## 2021-03-26 NOTE — Assessment & Plan Note (Signed)
History of hip fracture.  Check Vit D level

## 2021-03-26 NOTE — Progress Notes (Signed)
BP 138/84   Pulse 96   Temp 98.4 F (36.9 C) (Oral)   Resp 14   Ht 5\' 6"  (1.676 m)   Wt 115 lb 4.8 oz (52.3 kg)   SpO2 99%   BMI 18.61 kg/m    Subjective:    Patient ID: , female    DOB: 02/13/1946, 75 y.o.   MRN: [id number]  HPI: Laura Deleon is a 75 y.o. female  Chief Complaint  Patient presents with  . Hyperlipidemia  . Hypertension    Follow up   Hypertension Using medications without difficulty Average home BPs not checking   No problems or lightheadedness No chest pain with exertion or shortness of breath No Edema   Hyperlipidemia Pt states she did not tolerate the statin while she was in rehab following hip replacement.  However, states she feels better after stopping ASA.  Just taking Zetia at this time Using medications without problems: No Muscle aches  Diet compliance: "not good"  States she needs additional dental work Exercise: walks   Relevant past medical, surgical, family and social history reviewed and updated as indicated. Interim medical history since our last visit reviewed. Allergies and medications reviewed and updated.  Review of Systems  Per HPI unless specifically indicated above     Objective:    BP 138/84   Pulse 96   Temp 98.4 F (36.9 C) (Oral)   Resp 14   Ht 5\' 6"  (1.676 m)   Wt 115 lb 4.8 oz (52.3 kg)   SpO2 99%   BMI 18.61 kg/m   Wt Readings from Last 3 Encounters:  03/26/21 115 lb 4.8 oz (52.3 kg)  08/16/20 119 lb 14.4 oz (54.4 kg)  08/08/20 125 lb (56.7 kg)    Physical Exam Constitutional:      General: She is not in acute distress.    Appearance: She is well-developed.     Comments: Thin  HENT:     Head: Normocephalic and atraumatic.  Eyes:     General: Lids are normal. No scleral icterus.       Right eye: No discharge.        Left eye: No discharge.     Conjunctiva/sclera: Conjunctivae normal.  Neck:     Vascular: No carotid bruit or JVD.  Cardiovascular:     Rate and Rhythm:  Normal rate and regular rhythm.     Heart sounds: Normal heart sounds.  Pulmonary:     Effort: Pulmonary effort is normal.     Breath sounds: Normal breath sounds.  Abdominal:     Palpations: There is no hepatomegaly or splenomegaly.  Musculoskeletal:        General: Normal range of motion.     Cervical back: Normal range of motion and neck supple.  Skin:    General: Skin is warm and dry.     Coloration: Skin is not pale.     Findings: No rash.  Neurological:     Mental Status: She is alert and oriented to person, place, and time.  Psychiatric:        Behavior: Behavior normal.        Thought Content: Thought content normal.        Judgment: Judgment normal.     Results for orders placed or performed in visit on 08/16/20  CBC with Differential/Platelet  Result Value Ref Range   WBC 8.5 3.8 - 10.8 Thousand/uL   RBC 4.62 3.80 - 5.10 Million/uL  Hemoglobin 13.1 11.7 - 15.5 g/dL   HCT 06.2 69.4 - 85.4 %   MCV 86.4 80.0 - 100.0 fL   MCH 28.4 27.0 - 33.0 pg   MCHC 32.8 32.0 - 36.0 g/dL   RDW 62.7 03.5 - 00.9 %   Platelets 312 140 - 400 Thousand/uL   MPV 9.2 7.5 - 12.5 fL   Neutro Abs 5,704 1,500 - 7,800 cells/uL   Lymphs Abs 2,100 850 - 3,900 cells/uL   Absolute Monocytes 544 200 - 950 cells/uL   Eosinophils Absolute 111 15 - 500 cells/uL   Basophils Absolute 43 0 - 200 cells/uL   Neutrophils Relative % 67.1 %   Total Lymphocyte 24.7 %   Monocytes Relative 6.4 %   Eosinophils Relative 1.3 %   Basophils Relative 0.5 %  COMPLETE METABOLIC PANEL WITH GFR  Result Value Ref Range   Glucose, Bld 103 (H) 65 - 99 mg/dL   BUN 8 7 - 25 mg/dL   Creat 3.81 8.29 - 9.37 mg/dL   GFR, Est Non African American 87 > OR = 60 mL/min/1.70m2   GFR, Est African American 101 > OR = 60 mL/min/1.84m2   BUN/Creatinine Ratio NOT APPLICABLE 6 - 22 (calc)   Sodium 133 (L) 135 - 146 mmol/L   Potassium 4.6 3.5 - 5.3 mmol/L   Chloride 95 (L) 98 - 110 mmol/L   CO2 25 20 - 32 mmol/L   Calcium 10.0  8.6 - 10.4 mg/dL   Total Protein 7.1 6.1 - 8.1 g/dL   Albumin 4.3 3.6 - 5.1 g/dL   Globulin 2.8 1.9 - 3.7 g/dL (calc)   AG Ratio 1.5 1.0 - 2.5 (calc)   Total Bilirubin 0.4 0.2 - 1.2 mg/dL   Alkaline phosphatase (APISO) 84 37 - 153 U/L   AST 19 10 - 35 U/L   ALT 12 6 - 29 U/L  Lipid panel  Result Value Ref Range   Cholesterol 188 <200 mg/dL   HDL 57 > OR = 50 mg/dL   Triglycerides 169 <678 mg/dL   LDL Cholesterol (Calc) 109 (H) mg/dL (calc)   Total CHOL/HDL Ratio 3.3 <5.0 (calc)   Non-HDL Cholesterol (Calc) 131 (H) <130 mg/dL (calc)      Assessment & Plan:   Problem List Items Addressed This Visit      Unprioritized   Hyperlipidemia LDL goal <70    Check labs on only Zetia.  If LDL is above 70, restart at a lower dose      Relevant Medications   ezetimibe (ZETIA) 10 MG tablet   metoprolol succinate (TOPROL-XL) 50 MG 24 hr tablet   Hypertension goal BP (blood pressure) < 140/90 (Chronic)    Stable, continue present medications.        Relevant Medications   ezetimibe (ZETIA) 10 MG tablet   metoprolol succinate (TOPROL-XL) 50 MG 24 hr tablet   Other Relevant Orders   Lipid panel   Comprehensive metabolic panel   Osteoporosis with current pathological fracture - Primary    History of hip fracture.  Check Vit D level      Relevant Orders   VITAMIN D 25 Hydroxy (Vit-D Deficiency, Fractures)   Underweight    Pt with a lot of stress with caregiving for a great grandchild. Watching her diet but is perhaps too strict.  Discussed higher calorie meals.  May need to see nutritionist.  Check labs today      Relevant Orders   TSH   CBC with Differential/Platelet  Other Visit Diagnoses    Eye exam, routine       Relevant Orders   Ambulatory referral to Optometry       Follow up plan: Return in about 6 months (around 09/26/2021).

## 2021-03-26 NOTE — Assessment & Plan Note (Signed)
Stable, continue present medications.   

## 2021-03-27 ENCOUNTER — Other Ambulatory Visit: Payer: Self-pay | Admitting: Unknown Physician Specialty

## 2021-03-27 DIAGNOSIS — E785 Hyperlipidemia, unspecified: Secondary | ICD-10-CM

## 2021-03-27 LAB — CBC WITH DIFFERENTIAL/PLATELET
Absolute Monocytes: 547 cells/uL (ref 200–950)
Basophils Absolute: 38 cells/uL (ref 0–200)
Basophils Relative: 0.5 %
Eosinophils Absolute: 38 cells/uL (ref 15–500)
Eosinophils Relative: 0.5 %
HCT: 44.1 % (ref 35.0–45.0)
Hemoglobin: 14.7 g/dL (ref 11.7–15.5)
Lymphs Abs: 1725 cells/uL (ref 850–3900)
MCH: 28.8 pg (ref 27.0–33.0)
MCHC: 33.3 g/dL (ref 32.0–36.0)
MCV: 86.5 fL (ref 80.0–100.0)
MPV: 9.3 fL (ref 7.5–12.5)
Monocytes Relative: 7.2 %
Neutro Abs: 5252 cells/uL (ref 1500–7800)
Neutrophils Relative %: 69.1 %
Platelets: 231 10*3/uL (ref 140–400)
RBC: 5.1 10*6/uL (ref 3.80–5.10)
RDW: 12.6 % (ref 11.0–15.0)
Total Lymphocyte: 22.7 %
WBC: 7.6 10*3/uL (ref 3.8–10.8)

## 2021-03-27 LAB — COMPREHENSIVE METABOLIC PANEL
AG Ratio: 1.5 (calc) (ref 1.0–2.5)
ALT: 12 U/L (ref 6–29)
AST: 19 U/L (ref 10–35)
Albumin: 4.6 g/dL (ref 3.6–5.1)
Alkaline phosphatase (APISO): 76 U/L (ref 37–153)
BUN/Creatinine Ratio: 22 (calc) (ref 6–22)
BUN: 13 mg/dL (ref 7–25)
CO2: 28 mmol/L (ref 20–32)
Calcium: 10.4 mg/dL (ref 8.6–10.4)
Chloride: 95 mmol/L — ABNORMAL LOW (ref 98–110)
Creat: 0.58 mg/dL — ABNORMAL LOW (ref 0.60–0.93)
Globulin: 3 g/dL (calc) (ref 1.9–3.7)
Glucose, Bld: 100 mg/dL — ABNORMAL HIGH (ref 65–99)
Potassium: 4.9 mmol/L (ref 3.5–5.3)
Sodium: 133 mmol/L — ABNORMAL LOW (ref 135–146)
Total Bilirubin: 0.5 mg/dL (ref 0.2–1.2)
Total Protein: 7.6 g/dL (ref 6.1–8.1)

## 2021-03-27 LAB — LIPID PANEL
Cholesterol: 208 mg/dL — ABNORMAL HIGH (ref <200)
HDL: 66 mg/dL (ref >=50)
LDL Cholesterol (Calc): 124 mg/dL (calc) — ABNORMAL HIGH
Non-HDL Cholesterol (Calc): 142 mg/dL (calc) — ABNORMAL HIGH (ref <130)
Total CHOL/HDL Ratio: 3.2 (calc) (ref <5.0)
Triglycerides: 79 mg/dL (ref <150)

## 2021-03-27 LAB — VITAMIN D 25 HYDROXY (VIT D DEFICIENCY, FRACTURES): Vit D, 25-Hydroxy: 49 ng/mL (ref 30–100)

## 2021-03-27 MED ORDER — ATORVASTATIN CALCIUM 10 MG PO TABS: 5.0000 mg | ORAL_TABLET | ORAL | 3 refills | Status: DC

## 2021-05-14 ENCOUNTER — Ambulatory Visit: Payer: PPO

## 2021-05-23 ENCOUNTER — Ambulatory Visit (INDEPENDENT_AMBULATORY_CARE_PROVIDER_SITE_OTHER): Payer: PPO

## 2021-05-23 ENCOUNTER — Other Ambulatory Visit: Payer: Self-pay

## 2021-05-23 VITALS — BP 144/84 | HR 73 | Temp 98.0°F | Resp 16 | Ht 66.0 in | Wt 113.4 lb

## 2021-05-23 DIAGNOSIS — Z0000 Encounter for general adult medical examination without abnormal findings: Secondary | ICD-10-CM

## 2021-05-23 DIAGNOSIS — Z1231 Encounter for screening mammogram for malignant neoplasm of breast: Secondary | ICD-10-CM

## 2021-05-23 NOTE — Patient Instructions (Signed)
Laura Deleon , Thank you for taking time to come for your Medicare Wellness Visit. I appreciate your ongoing commitment to your health goals. Please review the following plan we discussed and let me know if I can assist you in the future.   Screening recommendations/referrals: Colonoscopy: done 2010; please discuss repeat screening colonoscopy at your next visit Mammogram: done 07/08/19. Please call [phone] to schedule your mammogram and bone density screening Bone Density: done 09/07/17 Recommended yearly ophthalmology/optometry visit for glaucoma screening and checkup Recommended yearly dental visit for hygiene and checkup  Vaccinations: Influenza vaccine: done 08/16/20 Pneumococcal vaccine: done 02/19/17 Tdap vaccine: done 08/08/20 Shingles vaccine: Shingrix discussed. Please contact your pharmacy for coverage information.  Covid-19: Covid vaccines available at your pharmacy or local health department  Advanced directives: Advance directive discussed with you today. I have provided a copy for you to complete at home and have notarized. Once this is complete please bring a copy in to our office so we can scan it into your chart.   Conditions/risks identified: Recommend continuing fall prevention in the home  Next appointment: Follow up in one year for your annual wellness visit    Preventive Care 65 Years and Older, Female Preventive care refers to lifestyle choices and visits with your health care provider that can promote health and wellness. What does preventive care include? A yearly physical exam. This is also called an annual well check. Dental exams once or twice a year. Routine eye exams. Ask your health care provider how often you should have your eyes checked. Personal lifestyle choices, including: Daily care of your teeth and gums. Regular physical activity. Eating a healthy diet. Avoiding tobacco and drug use. Limiting alcohol use. Practicing safe sex. Taking low-dose  aspirin every day. Taking vitamin and mineral supplements as recommended by your health care provider. What happens during an annual well check? The services and screenings done by your health care provider during your annual well check will depend on your age, overall health, lifestyle risk factors, and family history of disease. Counseling  Your health care provider may ask you questions about your: Alcohol use. Tobacco use. Drug use. Emotional well-being. Home and relationship well-being. Sexual activity. Eating habits. History of falls. Memory and ability to understand (cognition). Work and work Astronomer. Reproductive health. Screening  You may have the following tests or measurements: Height, weight, and BMI. Blood pressure. Lipid and cholesterol levels. These may be checked every 5 years, or more frequently if you are over 24 years old. Skin check. Lung cancer screening. You may have this screening every year starting at age 85 if you have a 30-pack-year history of smoking and currently smoke or have quit within the past 15 years. Fecal occult blood test (FOBT) of the stool. You may have this test every year starting at age 57. Flexible sigmoidoscopy or colonoscopy. You may have a sigmoidoscopy every 5 years or a colonoscopy every 10 years starting at age 17. Hepatitis C blood test. Hepatitis B blood test. Sexually transmitted disease (STD) testing. Diabetes screening. This is done by checking your blood sugar (glucose) after you have not eaten for a while (fasting). You may have this done every 1-3 years. Bone density scan. This is done to screen for osteoporosis. You may have this done starting at age 63. Mammogram. This may be done every 1-2 years. Talk to your health care provider about how often you should have regular mammograms. Talk with your health care provider about your test results, treatment  options, and if necessary, the need for more tests. Vaccines  Your  health care provider may recommend certain vaccines, such as: Influenza vaccine. This is recommended every year. Tetanus, diphtheria, and acellular pertussis (Tdap, Td) vaccine. You may need a Td booster every 10 years. Zoster vaccine. You may need this after age 51. Pneumococcal 13-valent conjugate (PCV13) vaccine. One dose is recommended after age 61. Pneumococcal polysaccharide (PPSV23) vaccine. One dose is recommended after age 25. Talk to your health care provider about which screenings and vaccines you need and how often you need them. This information is not intended to replace advice given to you by your health care provider. Make sure you discuss any questions you have with your health care provider. Document Released: 11/23/2015 Document Revised: 07/16/2016 Document Reviewed: 08/28/2015 Elsevier Interactive Patient Education  2017 Normal Prevention in the Home Falls can cause injuries. They can happen to people of all ages. There are many things you can do to make your home safe and to help prevent falls. What can I do on the outside of my home? Regularly fix the edges of walkways and driveways and fix any cracks. Remove anything that might make you trip as you walk through a door, such as a raised step or threshold. Trim any bushes or trees on the path to your home. Use bright outdoor lighting. Clear any walking paths of anything that might make someone trip, such as rocks or tools. Regularly check to see if handrails are loose or broken. Make sure that both sides of any steps have handrails. Any raised decks and porches should have guardrails on the edges. Have any leaves, snow, or ice cleared regularly. Use sand or salt on walking paths during winter. Clean up any spills in your garage right away. This includes oil or grease spills. What can I do in the bathroom? Use night lights. Install grab bars by the toilet and in the tub and shower. Do not use towel bars as  grab bars. Use non-skid mats or decals in the tub or shower. If you need to sit down in the shower, use a plastic, non-slip stool. Keep the floor dry. Clean up any water that spills on the floor as soon as it happens. Remove soap buildup in the tub or shower regularly. Attach bath mats securely with double-sided non-slip rug tape. Do not have throw rugs and other things on the floor that can make you trip. What can I do in the bedroom? Use night lights. Make sure that you have a light by your bed that is easy to reach. Do not use any sheets or blankets that are too big for your bed. They should not hang down onto the floor. Have a firm chair that has side arms. You can use this for support while you get dressed. Do not have throw rugs and other things on the floor that can make you trip. What can I do in the kitchen? Clean up any spills right away. Avoid walking on wet floors. Keep items that you use a lot in easy-to-reach places. If you need to reach something above you, use a strong step stool that has a grab bar. Keep electrical cords out of the way. Do not use floor polish or wax that makes floors slippery. If you must use wax, use non-skid floor wax. Do not have throw rugs and other things on the floor that can make you trip. What can I do with my stairs? Do not  leave any items on the stairs. Make sure that there are handrails on both sides of the stairs and use them. Fix handrails that are broken or loose. Make sure that handrails are as long as the stairways. Check any carpeting to make sure that it is firmly attached to the stairs. Fix any carpet that is loose or worn. Avoid having throw rugs at the top or bottom of the stairs. If you do have throw rugs, attach them to the floor with carpet tape. Make sure that you have a light switch at the top of the stairs and the bottom of the stairs. If you do not have them, ask someone to add them for you. What else can I do to help prevent  falls? Wear shoes that: Do not have high heels. Have rubber bottoms. Are comfortable and fit you well. Are closed at the toe. Do not wear sandals. If you use a stepladder: Make sure that it is fully opened. Do not climb a closed stepladder. Make sure that both sides of the stepladder are locked into place. Ask someone to hold it for you, if possible. Clearly mark and make sure that you can see: Any grab bars or handrails. First and last steps. Where the edge of each step is. Use tools that help you move around (mobility aids) if they are needed. These include: Canes. Walkers. Scooters. Crutches. Turn on the lights when you go into a dark area. Replace any light bulbs as soon as they burn out. Set up your furniture so you have a clear path. Avoid moving your furniture around. If any of your floors are uneven, fix them. If there are any pets around you, be aware of where they are. Review your medicines with your doctor. Some medicines can make you feel dizzy. This can increase your chance of falling. Ask your doctor what other things that you can do to help prevent falls. This information is not intended to replace advice given to you by your health care provider. Make sure you discuss any questions you have with your health care provider. Document Released: 08/23/2009 Document Revised: 04/03/2016 Document Reviewed: 12/01/2014 Elsevier Interactive Patient Education  2017 [employer].

## 2021-05-23 NOTE — Progress Notes (Signed)
Subjective:   Laura Deleon is a 75 y.o. female who presents for Medicare Annual (Subsequent) preventive examination.  Review of Systems     Cardiac Risk Factors include: advanced age (>64men, >36 women);dyslipidemia;hypertension     Objective:    Today's Vitals   05/23/21 1422  BP: (!) 144/84  Pulse: 73  Resp: 16  Temp: 98 F (36.7 C)  TempSrc: Oral  SpO2: 98%  Weight: 113 lb 6.4 oz (51.4 kg)  Height: 5\' 6"  (1.676 Deleon)   Body mass index is 18.3 kg/Deleon.  Advanced Directives 05/23/2021 08/08/2020 07/04/2020 07/04/2020 07/03/2020 05/10/2020 05/05/2019  Does Patient Have a Medical Advance Directive? No No No No No No No  Does patient want to make changes to medical advance directive? - - - - - - -  Copy of Healthcare Power of Attorney in Chart? - - - - - - -  Would patient like information on creating a medical advance directive? Yes (MAU/Ambulatory/Procedural Areas - Information given) No - Patient declined - Yes (Inpatient - patient requests chaplain consult to create a medical advance directive) - Yes (MAU/Ambulatory/Procedural Areas - Information given) Yes (MAU/Ambulatory/Procedural Areas - Information given)    Current Medications (verified) Outpatient Encounter Medications as of 05/23/2021  Medication Sig   ezetimibe (ZETIA) 10 MG tablet Take 1 tablet (10 mg total) by mouth daily.   fluticasone (FLONASE) 50 MCG/ACT nasal spray Place 2 sprays into both nostrils as needed.   loratadine (CLARITIN) 10 MG tablet Take 1 tablet (10 mg total) by mouth daily as needed for allergies.   metoprolol succinate (TOPROL-XL) 50 MG 24 hr tablet Take 1 tablet (50 mg total) by mouth daily.   Multiple Vitamin (MULTI-VITAMINS) TABS Take 1 tablet by mouth daily.   atorvastatin (LIPITOR) 10 MG tablet Take 0.5 tablets (5 mg total) by mouth every other day. (Patient not taking: Reported on 05/23/2021)   [DISCONTINUED] aspirin 81 MG EC tablet Take 81 mg by mouth daily. Swallow whole. (Patient not  taking: Reported on 03/26/2021)   No facility-administered encounter medications on file as of 05/23/2021.    Allergies (verified) Sulfa antibiotics and Latex   History: Past Medical History:  Diagnosis Date   Allergy    Hernia of anterior abdominal wall    Hyperlipidemia    Hypertension    Osteopenia 09/08/2017   Oct 2018; next scan => Sep 10, 2019   Past Surgical History:  Procedure Laterality Date   ABDOMINAL HYSTERECTOMY     BLADDER SURGERY     HAND SURGERY  11/14/2016   removal of foreign body   HERNIA REPAIR     with mesh   HIP ARTHROPLASTY Left 07/04/2020   Procedure: ARTHROPLASTY BIPOLAR HIP (HEMIARTHROPLASTY);  Surgeon: 07/06/2020, MD;  Location: [facility] ORS;  Service: Orthopedics;  Laterality: Left;   Family History  Problem Relation Age of Onset   Hyperlipidemia Mother    Hypertension Mother    Hyperlipidemia Father    Emphysema Father    COPD Father    Breast cancer Neg Hx    Social History   Socioeconomic History   Marital status: Widowed    Spouse name: Laura Deleon   Number of children: 2   Years of education: Not on file   Highest education level: 12th grade  Occupational History   Occupation: Retired  Tobacco Use   Smoking status: Never   Smokeless tobacco: Never   Tobacco comments:    smoking cessation materials not required  Vaping Use  Vaping Use: Never used  Substance and Sexual Activity   Alcohol use: No    Alcohol/week: 0.0 standard drinks   Drug use: No   Sexual activity: Not Currently  Other Topics Concern   Not on file  Social History Narrative   Husband passed away 03-02-16, she is raising her 45 year old great grandson   Social Determinants of Corporate investment banker Strain: Low Risk    Difficulty of Paying Living Expenses: Not very hard  Food Insecurity: No Food Insecurity   Worried About Programme researcher, broadcasting/film/video in the Last Year: Never true   Barista in the Last Year: Never true  Transportation Needs: No Transportation  Needs   Lack of Transportation (Medical): No   Lack of Transportation (Non-Medical): No  Physical Activity: Insufficiently Active   Days of Exercise per Week: 7 days   Minutes of Exercise per Session: 20 min  Stress: Stress Concern Present   Feeling of Stress : To some extent  Social Connections: Moderately Isolated   Frequency of Communication with Friends and Family: More than three times a week   Frequency of Social Gatherings with Friends and Family: Three times a week   Attends Religious Services: More than 4 times per year   Active Member of Clubs or Organizations: No   Attends Banker Meetings: Never   Marital Status: Widowed    Tobacco Counseling Counseling given: Not Answered Tobacco comments: smoking cessation materials not required   Clinical Intake:  Pre-visit preparation completed: Yes  Pain : No/denies pain     BMI - recorded: 18.3 Nutritional Status: BMI <19  Underweight Nutritional Risks: None Diabetes: No  How often do you need to have someone help you when you read instructions, pamphlets, or other written materials from your doctor or pharmacy?: 1 - Never   Interpreter Needed?: No  Information entered by :: Laura Littler LPN   Activities of Daily Living In your present state of health, do you have any difficulty performing the following activities: 05/23/2021 03/26/2021  Hearing? N N  Vision? Y Y  Difficulty concentrating or making decisions? N N  Walking or climbing stairs? Y Y  Dressing or bathing? N N  Doing errands, shopping? N N  Preparing Food and eating ? N -  Using the Toilet? N -  In the past six months, have you accidently leaked urine? Y -  Comment wears pads for protection -  Do you have problems with loss of bowel control? N -  Managing your Medications? N -  Managing your Finances? N -  Housekeeping or managing your Housekeeping? N -  Some recent data might be hidden    Patient Care Team: Laura Berry, PA-C as  PCP - General (Family Medicine) [place], Laura Deleon, [place] as Social Worker  Indicate any recent Medical Services you may have received from other than [facility] providers in the past year (date may be approximate).     Assessment:   This is a routine wellness examination for [name].  Hearing/Vision screen Hearing Screening - Comments:: Pt denies hearing difficulty Vision Screening - Comments:: Past due for eye exam. Established at [facility]  Dietary issues and exercise activities discussed: Current Exercise Habits: Home exercise routine, Type of exercise: walking, Time (Minutes): 20, Frequency (Times/Week): 7, Weekly Exercise (Minutes/Week): 140, Intensity: Mild, Exercise limited by: orthopedic condition(s)   Goals Addressed   None    Depression Screen [facility] 2/9 Scores 05/23/2021 03/26/2021 09/19/2020  09/10/2020 08/16/2020 05/10/2020 11/22/2019  PHQ - 2 Score 2 1 0 0 0 1 0  PHQ- 9 Score 5 - - - - 5 2    Fall Risk Fall Risk  05/23/2021 03/26/2021 09/10/2020 08/16/2020 05/23/2020  Falls in the past year? 1 1 0 1 0  Number falls in past yr: 0 0 0 1 0  Injury with Fall? 1 1 0 1 0  Risk for fall due to : History of fall(s);Impaired balance/gait History of fall(s);Impaired balance/gait;Impaired mobility;Impaired vision - - -  Risk for fall due to: Comment - - - - -  Follow up Falls prevention discussed Falls evaluation completed Falls evaluation completed Falls evaluation completed -    FALL RISK PREVENTION PERTAINING TO THE HOME:  Any stairs in or around the home? Yes  If so, are there any without handrails? No  Home free of loose throw rugs in walkways, pet beds, electrical cords, etc? Yes  Adequate lighting in your home to reduce risk of falls? Yes   ASSISTIVE DEVICES UTILIZED TO PREVENT FALLS:  Life alert? No  Use of a cane, walker or w/c? Yes  Grab bars in the bathroom? No  Shower chair or bench in shower? No  Elevated toilet seat or a handicapped toilet? No   TIMED UP AND  GO:  Was the test performed? Yes .  Length of time to ambulate 10 feet: 6 sec.   Gait steady and fast with assistive device  Cognitive Function:     6CIT Screen 05/10/2020 05/05/2019 04/29/2018  What Year? 0 points 0 points 0 points  What month? 0 points 0 points 0 points  What time? 0 points 0 points 0 points  Count back from 20 0 points 0 points 0 points  Months in reverse 0 points 0 points 0 points  Repeat phrase 0 points 2 points 2 points  Total Score 0 2 2    Immunizations Immunization History  Administered Date(s) Administered   Fluad Quad(high Dose 65+) 08/04/2019, 08/16/2020   Influenza, High Dose Seasonal PF 08/19/2016, 08/27/2017, 07/30/2018   Influenza,inj,Quad PF,6+ Mos 07/19/2015   Influenza-Unspecified 07/11/2014   Pneumococcal Conjugate-13 08/01/2014   Pneumococcal Polysaccharide-23 02/19/2017   Tdap 11/23/2007, 11/14/2016, 08/08/2020    TDAP status: Up to date  Flu Vaccine status: Up to date  Pneumococcal vaccine status: Up to date  Covid-19 vaccine status: Information provided on how to obtain vaccines.   Qualifies for Shingles Vaccine? Yes   Zostavax completed No   Shingrix Completed?: No.    Education has been provided regarding the importance of this vaccine. Patient has been advised to call insurance company to determine out of pocket expense if they have not yet received this vaccine. Advised may also receive vaccine at local pharmacy or Health Dept. Verbalized acceptance and understanding.  Screening Tests Health Maintenance  Topic Date Due   COVID-19 Vaccine (1) Never done   Zoster Vaccines- Shingrix (1 of 2) Never done   DEXA SCAN  09/08/2019   MAMMOGRAM  07/07/2020   COLONOSCOPY (Pts 45-23yrs Insurance coverage will need to be confirmed)  03/26/2022 (Originally 12/11/2018)   INFLUENZA VACCINE  06/10/2021   COLON CANCER SCREENING ANNUAL FOBT  08/30/2021   TETANUS/TDAP  08/08/2030   Hepatitis C Screening  Completed   PNA vac Low Risk Adult   Completed   HPV VACCINES  Aged Out    Health Maintenance  Health Maintenance Due  Topic Date Due   COVID-19 Vaccine (1) Never done  Zoster Vaccines- Shingrix (1 of 2) Never done   DEXA SCAN  09/08/2019   MAMMOGRAM  07/07/2020    Colorectal cancer screening: Type of screening: Colonoscopy. Completed 2010. Repeat every 10 years. Pt to discuss with PCP  Mammogram status: Completed 07/08/19. Repeat every year  Bone Density status: Completed 09/07/17. Results reflect: Bone density results: OSTEOPENIA. Repeat every 2 years. Ordered 08/16/20  Lung Cancer Screening: (Low Dose CT Chest recommended if Age 53-80 years, 30 pack-year currently smoking OR have quit w/in 15years.) does not qualify.   Additional Screening:  Hepatitis C Screening: does qualify; Completed 10/12/17  Vision Screening: Recommended annual ophthalmology exams for early detection of glaucoma and other disorders of the eye. Is the patient up to date with their annual eye exam?  No  - pt states she has upcoming appt scheduled Who is the provider or what is the name of the office in which the patient attends annual eye exams? [facility].   Dental Screening: Recommended annual dental exams for proper oral hygiene  Community Resource Referral / Chronic Care Management: CRR required this visit?  No   CCM required this visit?  No      Plan:     I have personally reviewed and noted the following in the patient's chart:   Medical and social history Use of alcohol, tobacco or illicit drugs  Current medications and supplements including opioid prescriptions.  Functional ability and status Nutritional status Physical activity Advanced directives List of other physicians Hospitalizations, surgeries, and ER visits in previous 12 months Vitals Screenings to include cognitive, depression, and falls Referrals and appointments  In addition, I have reviewed and discussed with patient certain preventive  protocols, quality metrics, and best practice recommendations. A written personalized care plan for preventive services as well as general preventive health recommendations were provided to patient.     Laura LittlerKasey Asmar Brozek, LPN   1/61/09607/14/2022   Nurse Notes: pt states she stopped taking atorvastatin due to causing pain in her chest, she was previously taking 1/2 tablet and tolerated that better  Pt is still taking zetia. Scheduled for follow up 07/11/21. Pt also c/o slight headache she thinks is related to needing new glasses but has an eye appt scheduled. BP 144/84 at start of visit; same at end of visit. Pt advised to contact office prior to next appt if needed.

## 2021-06-04 ENCOUNTER — Other Ambulatory Visit: Payer: Self-pay | Admitting: Unknown Physician Specialty

## 2021-06-11 ENCOUNTER — Telehealth: Payer: Self-pay

## 2021-06-11 NOTE — Telephone Encounter (Signed)
Copied from CRM [id number]. Topic: General - Inquiry >> Jun 11, 2021 11:57 AM Laura Deleon D wrote: Reason for CRM: Pt called saying th pharmacy told her they did not recd a refill request for her Metoprolol on 5 /17/22.  She said she has called for her prescription and they told her there were no more refills.  [facility]  in [place] >> Jun 11, 2021 12:06 PM Laura Deleon D wrote: When I looked back at the rx it said it had been discontinued.  Please call the patient and let her know if ahe should be taking the toprol XL or not  CB#  [phone]

## 2021-06-11 NOTE — Telephone Encounter (Signed)
Left vm with [facility] should have 6 month supply.  Pt notified

## 2021-06-14 ENCOUNTER — Telehealth: Payer: Self-pay

## 2021-06-14 NOTE — Telephone Encounter (Signed)
Copied from CRM [id number]. Topic: General - Other >> Jun 14, 2021 12:45 PM Pawlus, Maxine Glenn A wrote: Reason for CRM: Pt stated she dropped off paperwork earlier this week and was told it should be ready by Friday (8/5) please advise pt if the paperwork is ready to collect.

## 2021-06-14 NOTE — Telephone Encounter (Signed)
Laura Deleon work on paperwork should be done on monday

## 2021-06-18 ENCOUNTER — Telehealth: Payer: Self-pay

## 2021-06-18 NOTE — Telephone Encounter (Signed)
Copied from CRM [id number]. Topic: General - Other >> Jun 14, 2021 12:45 PM Pawlus, Maxine Glenn A wrote: Reason for CRM: Pt stated she dropped off paperwork earlier this week and was told it should be ready by Friday (8/5) please advise pt if the paperwork is ready to collect. >> Jun 18, 2021 10:52 AM Marylen Ponto wrote: Pt called to see if paperwork was completed. Pt requests call back

## 2021-06-18 NOTE — Telephone Encounter (Signed)
I got through some of the backlog of papers today but did not see it I will continue to look for it and get it done as soon as I see it

## 2021-06-24 NOTE — Telephone Encounter (Addendum)
Patient calling checking on the status of the [employer] [facility] form. Patient would like a follow up call today because its been a month sense the specialist has heard back from patient. Best # to reach patient  [phone].    [facility] [address], [place], [place] [address] [phone]

## 2021-06-25 NOTE — Telephone Encounter (Signed)
Please advise 

## 2021-07-11 ENCOUNTER — Ambulatory Visit (INDEPENDENT_AMBULATORY_CARE_PROVIDER_SITE_OTHER): Payer: PPO | Admitting: Unknown Physician Specialty

## 2021-07-11 ENCOUNTER — Other Ambulatory Visit: Payer: Self-pay

## 2021-07-11 VITALS — BP 184/94 | HR 77 | Temp 98.0°F | Ht 66.0 in | Wt 111.5 lb

## 2021-07-11 DIAGNOSIS — R636 Underweight: Secondary | ICD-10-CM | POA: Diagnosis not present

## 2021-07-11 DIAGNOSIS — M80059D Age-related osteoporosis with current pathological fracture, unspecified femur, subsequent encounter for fracture with routine healing: Secondary | ICD-10-CM | POA: Diagnosis not present

## 2021-07-11 DIAGNOSIS — E785 Hyperlipidemia, unspecified: Secondary | ICD-10-CM

## 2021-07-11 DIAGNOSIS — Z0000 Encounter for general adult medical examination without abnormal findings: Secondary | ICD-10-CM | POA: Diagnosis not present

## 2021-07-11 DIAGNOSIS — Z7189 Other specified counseling: Secondary | ICD-10-CM

## 2021-07-11 DIAGNOSIS — F419 Anxiety disorder, unspecified: Secondary | ICD-10-CM | POA: Diagnosis not present

## 2021-07-11 DIAGNOSIS — I10 Essential (primary) hypertension: Secondary | ICD-10-CM | POA: Diagnosis not present

## 2021-07-11 NOTE — Assessment & Plan Note (Signed)
Generalized anxiety.  Not wanting medications at this time.

## 2021-07-11 NOTE — Assessment & Plan Note (Signed)
Unable to give meds at this time due to dental implant surgery needed.  Will reassess after teeth are managed

## 2021-07-11 NOTE — Assessment & Plan Note (Signed)
A voluntary discussion about advance care planning including the explanation and discussion of advance directives was extensively discussed  with the patient.  Explanation about the health care proxy and Living will was reviewed and packet with forms with explanation of how to fill them out was given.  During this discussion, the patient was able to identify a health care proxy as her son and daughter and plans/does not plan to fill out the paperwork required.  She states she would like to not be dependent on machines.  Patient was offered a separate Advance Care Planning visit for further assistance with forms.  Time spent:  20 minutes.

## 2021-07-11 NOTE — Assessment & Plan Note (Addendum)
Poor control.  Will add Amlodipine 5 mg daily.  Continue Metoprolol.  Encouraged to monitor at home but feels emotionally unable to. Avoid HCTZ for now with mild hyponatremia

## 2021-07-11 NOTE — Progress Notes (Addendum)
BP (!) 184/94   Pulse 77   Temp 98 F (36.7 C)   Ht 5\' 6"  (1.676 m)   Wt 111 lb 8 oz (50.6 kg)   SpO2 99%   BMI 18.00 kg/m    Subjective:    Patient ID: , female    DOB: 03-30-1946, 75 y.o.   MRN: [id number]  HPI: Judianne Deleon is a 75 y.o. female  Chief Complaint  Patient presents with   Annual Exam   Pt is here for general history and physical.  She has not had any Covid or shingles vaccine.  She is also due for a bone density and mammogram.  She is refusing for now as she would like to get her teeth and eyes looked at and managed.    S/P hip fracture 66 and broke hip August of 2021 and feels she is still recovering.  States her "nerves have been shot" since then.  She is refusing any medication at this time.  Does keep busy and walks on a regular basis.    Depression screen [facility] 2/9 07/11/2021 05/23/2021 03/26/2021 09/19/2020 09/10/2020  Decreased Interest 0 1 0 0 0  Down, Depressed, Hopeless 0 1 1 0 0  PHQ - 2 Score 0 2 1 0 0  Altered sleeping 0 2 - - -  Tired, decreased energy 0 0 - - -  Change in appetite 0 1 - - -  Feeling bad or failure about yourself  0 0 - - -  Trouble concentrating 0 0 - - -  Moving slowly or fidgety/restless 0 0 - - -  Suicidal thoughts 0 0 - - -  PHQ-9 Score 0 5 - - -  Difficult doing work/chores Not difficult at all Not difficult at all - - -  Some recent data might be hidden   .gad   Caregiver stress Caring for grandson who is in sixth grade.   Hypertension Poor control at this time.  Is not able to take her own blood pressure as it makes her too nervous and also unable to afford to buy a cuff.  Thinks she gets nervous at the doctor's office.    Cholesterol Reports severs side effects to Atorvastatin.  Refuses statin at this time   Weight loss Unable to eat normally due to poor dentition.  This is actively being managed by an implant specialist   Relevant past medical, surgical, family and social history  reviewed and updated as indicated. Interim medical history since our last visit reviewed. Allergies and medications reviewed and updated.  Review of Systems  Constitutional:  Negative for activity change, fatigue and fever.  HENT:  Positive for dental problem.   Respiratory: Negative.    Cardiovascular: Negative.   Gastrointestinal: Negative.   Skin: Negative.   Psychiatric/Behavioral:  The patient is nervous/anxious.    Per HPI unless specifically indicated above     Objective:    BP (!) 184/94   Pulse 77   Temp 98 F (36.7 C)   Ht 5\' 6"  (1.676 m)   Wt 111 lb 8 oz (50.6 kg)   SpO2 99%   BMI 18.00 kg/m   Wt Readings from Last 3 Encounters:  07/11/21 111 lb 8 oz (50.6 kg)  05/23/21 113 lb 6.4 oz (51.4 kg)  03/26/21 115 lb 4.8 oz (52.3 kg)    Physical Exam Constitutional:      General: She is not in acute distress.    Appearance: Normal appearance.  She is well-developed. She is not ill-appearing.  HENT:     Head: Normocephalic and atraumatic.  Eyes:     General: Lids are normal. No scleral icterus.       Right eye: No discharge.        Left eye: No discharge.     Conjunctiva/sclera: Conjunctivae normal.  Neck:     Vascular: No carotid bruit or JVD.  Cardiovascular:     Rate and Rhythm: Normal rate and regular rhythm.     Heart sounds: Normal heart sounds.  Pulmonary:     Effort: Pulmonary effort is normal. No respiratory distress.     Breath sounds: Normal breath sounds.  Abdominal:     Palpations: There is no hepatomegaly or splenomegaly.  Musculoskeletal:        General: Normal range of motion.     Cervical back: Normal range of motion and neck supple.  Skin:    General: Skin is warm and dry.     Coloration: Skin is not pale.     Findings: No rash.  Neurological:     Mental Status: She is alert and oriented to person, place, and time.  Psychiatric:        Behavior: Behavior normal.        Thought Content: Thought content normal.        Judgment:  Judgment normal.    Results for orders placed or performed in visit on 03/26/21  Lipid panel  Result Value Ref Range   Cholesterol 208 (H) <200 mg/dL   HDL 66 > OR = 50 mg/dL   Triglycerides 79 <564 mg/dL   LDL Cholesterol (Calc) 124 (H) mg/dL (calc)   Total CHOL/HDL Ratio 3.2 <5.0 (calc)   Non-HDL Cholesterol (Calc) 142 (H) <130 mg/dL (calc)  Comprehensive metabolic panel  Result Value Ref Range   Glucose, Bld 100 (H) 65 - 99 mg/dL   BUN 13 7 - 25 mg/dL   Creat 3.32 (L) 9.51 - 0.93 mg/dL   BUN/Creatinine Ratio 22 6 - 22 (calc)   Sodium 133 (L) 135 - 146 mmol/L   Potassium 4.9 3.5 - 5.3 mmol/L   Chloride 95 (L) 98 - 110 mmol/L   CO2 28 20 - 32 mmol/L   Calcium 10.4 8.6 - 10.4 mg/dL   Total Protein 7.6 6.1 - 8.1 g/dL   Albumin 4.6 3.6 - 5.1 g/dL   Globulin 3.0 1.9 - 3.7 g/dL (calc)   AG Ratio 1.5 1.0 - 2.5 (calc)   Total Bilirubin 0.5 0.2 - 1.2 mg/dL   Alkaline phosphatase (APISO) 76 37 - 153 U/L   AST 19 10 - 35 U/L   ALT 12 6 - 29 U/L  CBC with Differential/Platelet  Result Value Ref Range   WBC 7.6 3.8 - 10.8 Thousand/uL   RBC 5.10 3.80 - 5.10 Million/uL   Hemoglobin 14.7 11.7 - 15.5 g/dL   HCT 88.4 16.6 - 06.3 %   MCV 86.5 80.0 - 100.0 fL   MCH 28.8 27.0 - 33.0 pg   MCHC 33.3 32.0 - 36.0 g/dL   RDW 01.6 01.0 - 93.2 %   Platelets 231 140 - 400 Thousand/uL   MPV 9.3 7.5 - 12.5 fL   Neutro Abs 5,252 1,500 - 7,800 cells/uL   Lymphs Abs 1,725 850 - 3,900 cells/uL   Absolute Monocytes 547 200 - 950 cells/uL   Eosinophils Absolute 38 15 - 500 cells/uL   Basophils Absolute 38 0 - 200 cells/uL   Neutrophils  Relative % 69.1 %   Total Lymphocyte 22.7 %   Monocytes Relative 7.2 %   Eosinophils Relative 0.5 %   Basophils Relative 0.5 %  VITAMIN D 25 Hydroxy (Vit-D Deficiency, Fractures)  Result Value Ref Range   Vit D, 25-Hydroxy 49 30 - 100 ng/mL      Assessment & Plan:   Problem List Items Addressed This Visit       Unprioritized   Hypertension goal BP (blood  pressure) < 140/90 (Chronic)    Poor control.  Will add Amlodipine 5 mg daily.  Continue Metoprolol.  Encouraged to monitor at home but feels emotionally unable to. Avoid HCTZ for now with mild hyponatremia      Advance care planning    A voluntary discussion about advance care planning including the explanation and discussion of advance directives was extensively discussed  with the patient.  Explanation about the health care proxy and Living will was reviewed and packet with forms with explanation of how to fill them out was given.  During this discussion, the patient was able to identify a health care proxy as her son and daughter and plans/does not plan to fill out the paperwork required.  She states she would like to not be dependent on machines.  Patient was offered a separate Advance Care Planning visit for further assistance with forms.  Time spent:  20 minutes.         Anxiety    Generalized anxiety.  Not wanting medications at this time.        Hip fracture due to osteoporosis with routine healing    Unable to give meds at this time due to dental implant surgery needed.  Will reassess after teeth are managed      Hyperlipidemia LDL goal <70    Not to goal.  Refusing statins.  Continue with Zetia      Underweight    Probably due to poor dentition.  This is being worked on by an Clinical biochemist      Other Visit Diagnoses     Routine general medical examination at a health care facility    -  Primary       She has not had any Covid or shingles vaccine.  She is also due for a bone density and mammogram.  She is refusing for now as she would like to get her teeth and eyes looked at and managed.    No additional labs needed as refusing statins.   Refer to chronic care management and social work  Follow up plan: Return in about 4 weeks (around 08/08/2021).  Needs BP check in 1 week

## 2021-07-11 NOTE — Addendum Note (Signed)
Addended by: Gabriel Cirri on: 07/11/2021 12:56 PM   Modules accepted: Orders

## 2021-07-11 NOTE — Assessment & Plan Note (Signed)
Probably due to poor dentition.  This is being worked on by an Clinical biochemist

## 2021-07-11 NOTE — Patient Instructions (Signed)
Recommend BP cuff with home monitoring  Add Amlodipine 5 mg daily

## 2021-07-11 NOTE — Assessment & Plan Note (Signed)
Not to goal.  Refusing statins.  Continue with Zetia

## 2021-07-12 ENCOUNTER — Telehealth: Payer: Self-pay | Admitting: *Deleted

## 2021-07-12 NOTE — Chronic Care Management (AMB) (Signed)
  Chronic Care Management   Note  07/12/2021 Name: Laura Deleon MRN: [id number] DOB: 1945/11/26  [name] is a 75 y.o. year old female who is a primary care patient of Delsa Grana, [place]. I reached out to Beryle Lathe by phone today in response to a referral sent by Ms. [name]'s PCP Delsa Grana, PA-C     [name] was given information about Chronic Care Management services today including:  CCM service includes personalized support from designated clinical staff supervised by her physician, including individualized plan of care and coordination with other care providers 24/7 contact phone numbers for assistance for urgent and routine care needs. Service will only be billed when office clinical staff spend 20 minutes or more in a month to coordinate care. Only one practitioner may furnish and bill the service in a calendar month. The patient may stop CCM services at any time (effective at the end of the month) by phone call to the office staff. The patient will be responsible for cost sharing (co-pay) of up to 20% of the service fee (after annual deductible is met).  Patient agreed to services and verbal consent obtained.   Follow up plan: Telephone appointment with care management team member scheduled for: 07/25/2021 and 07/26/2021  Julian Hy, [facility]  Direct Dial: [phone]

## 2021-07-18 ENCOUNTER — Ambulatory Visit: Payer: PPO

## 2021-07-18 ENCOUNTER — Other Ambulatory Visit: Payer: Self-pay

## 2021-07-18 VITALS — BP 164/84

## 2021-07-18 DIAGNOSIS — Z0130 Encounter for examination of blood pressure without abnormal findings: Secondary | ICD-10-CM

## 2021-07-25 ENCOUNTER — Ambulatory Visit (INDEPENDENT_AMBULATORY_CARE_PROVIDER_SITE_OTHER): Payer: PPO

## 2021-07-25 DIAGNOSIS — I10 Essential (primary) hypertension: Secondary | ICD-10-CM

## 2021-07-25 DIAGNOSIS — E785 Hyperlipidemia, unspecified: Secondary | ICD-10-CM

## 2021-07-25 DIAGNOSIS — Z9181 History of falling: Secondary | ICD-10-CM

## 2021-07-25 NOTE — Chronic Care Management (AMB) (Signed)
Chronic Care Management   CCM RN Visit Note  07/25/2021 Name: Laura Deleon MRN: [id number] DOB: 03-Jan-1946  Subjective: Laura Deleon is a 75 y.o. year old female who is a primary care patient of Delsa Grana, [place]. The care management team was consulted for assistance with disease management and care coordination needs.    Engaged with patient by telephone for initial visit in response to provider referral for case management and care coordination services.    Consent to Services:  The patient was given the following information about Chronic Care Management services: 1. CCM service includes personalized support from designated clinical staff supervised by the primary care provider, including individualized plan of care and coordination with other care providers 2. 24/7 contact phone numbers for assistance for urgent and routine care needs. 3. Service will only be billed when office clinical staff spend 20 minutes or more in a month to coordinate care. 4. Only one practitioner may furnish and bill the service in a calendar month. 5.The patient may stop CCM services at any time (effective at the end of the month) by phone call to the office staff. 6. The patient will be responsible for cost sharing (co-pay) of up to 20% of the service fee (after annual deductible is met). Patient agreed to services and consent obtained.    Assessment: Review of patient past medical history, allergies, medications, health status, including review of consultants reports, laboratory and other test data, was performed as part of comprehensive evaluation and provision of chronic care management services.   SDOH (Social Determinants of Health) assessments and interventions performed:   SDOH Interventions    Flowsheet Row Most Recent Value  SDOH Interventions   Food Insecurity Interventions Intervention Not Indicated  Transportation Interventions Intervention Not Indicated        CCM Care  Plan  Allergies  Allergen Reactions   Sulfa Antibiotics Shortness Of Breath   Latex     Outpatient Encounter Medications as of 07/25/2021  Medication Sig Note   ezetimibe (ZETIA) 10 MG tablet TAKE 1 TABLET(10 MG) BY MOUTH DAILY    metoprolol succinate (TOPROL-XL) 50 MG 24 hr tablet Take 1 tablet (50 mg total) by mouth daily.    Multiple Vitamin (MULTI-VITAMINS) TABS Take 1 tablet by mouth daily.    fluticasone (FLONASE) 50 MCG/ACT nasal spray Place 2 sprays into both nostrils as needed. 03/26/2021: prn   loratadine (CLARITIN) 10 MG tablet Take 1 tablet (10 mg total) by mouth daily as needed for allergies. 03/26/2021: prn   No facility-administered encounter medications on file as of 07/25/2021.    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[place]) 12/16/2016   Advance care planning 08/19/2016   Abdominal wall hernia 07/19/2015   Allergic rhinitis, seasonal 07/19/2015   Blood glucose elevated 07/19/2015   Hypertension goal BP (blood pressure) < 140/90 07/19/2015   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 $RemoveBe'1 1 1 1 'xsFeHPTyn$ 0  Number falls in past yr: 0 0 0 0 0  Injury with Fall? $RemoveBe'1 1 1 1 'DKVDCJQdL$ 0  Risk for fall  due to : Impaired balance/gait - History of fall(s);Impaired balance/gait History of fall(s);Impaired balance/gait;Impaired mobility;Impaired vision -  Risk for fall due to: Comment - - - - -  Follow up Falls prevention discussed - Falls prevention discussed Falls evaluation completed Falls evaluation completed     Current Barriers:  Risk for Falls related to Impaired Balance and Gait  Clinical Goal(s):  Over the next 120 days, patient will not experience falls or require hospitalization for fall related injuries.  Interventions:  Collaboration with Delsa Grana, PA-C regarding development and update of comprehensive plan of care as evidenced by provider attestation and co-signature Inter-disciplinary care team collaboration (see longitudinal plan of care) Reviewed medications and discussed potential side effects that may increase risk for falls.  Provided information regarding safety and fall prevention. Reports currently using a cane with all ambulation. Reports last fall was in August of 2021. Fall resulted in a broken hip. Reports experiencing imbalance since this event. Advised to avoid prolonged activity to prevent accident falls. Encouraged to avoid attempting to lift weighted objects and request assistance when needed. Advised to continue using cane with all ambulation. Discussed ability to perform ADL's and tasks in the home. Reports grandson lives in the home. Reports good family support from children that call everyday. She is able to perform ADL's and small tasks in the home independently.  Declines current need for additional in-home assistance. She is interested in volunteer groups that may be able to assist with yard work.   Self-Care Deficits/Patient Goals:  Utilize assistive device appropriately with all ambulation Ensure pathways are clear and well lit Change positions slowly and use caution when ambulating Wear secure fitting, skid free footwear when ambulating Update the  care management team with changes in functional status Notify provider or care management team for questions and new concerns as needed    Follow Up Plan:  Will follow up next month    Patient Care Plan: Hypertension and Hyperlipidemia     Problem Identified: Hypertension and Hyperlipidemia      Long-Range Goal: Hypertension and Hyperlipidemia Monitored   Start Date: 07/25/2021  Expected End Date: 11/22/2021  Priority: High  Note:   Objective:  Last practice recorded BP readings:  BP Readings from Last 3 Encounters:  07/18/21 (!) 164/84  07/11/21 (!) 184/94  05/23/21 (!) 144/84   Most recent eGFR/CrCl: No results found for: EGFR  No components found for: CRCL  Lab Results  Component Value Date   CHOL 208 (H) 03/26/2021   HDL 66 03/26/2021   LDLCALC 124 (H) 03/26/2021   TRIG 79 03/26/2021   CHOLHDL 3.2 03/26/2021     Current Barriers:  Chronic Disease Management support and educational needs related to HTN and HLD.  Case Manager Clinical Goal(s):  Over the next 120 days, patient will demonstrate improved adherence to prescribed treatment plan as evidenced by taking all medications as prescribed, monitoring and recording blood pressure and adhering to low sodium/DASH diet.   Interventions:  Collaboration with Delsa Grana, PA-C regarding development and update of comprehensive plan of care as evidenced by provider attestation and  co-signature Inter-disciplinary care team collaboration (see longitudinal plan of care) Reviewed medications and importance of compliance. Advised to continue taking medications as prescribed. Advised to notify provider if unable to tolerate prescribed regimen. Provided information regarding established blood pressure parameters along with indications for notifying a provider. Reports not monitoring BP. Advised to monitor and record readings.  Discussed compliance with recommended cardiac prudent diet. Encouraged to read nutrition labels,  monitor sodium intake and avoid highly processed foods when possible. Discussed nutritional concerns related to difficulty chewing. Currently consuming Boost and almond milk in addition to soft meals. Thoroughly discussed foods and meal options. Advised to follow up with her assigned dental team as recommended. Agreed to update the care management team regarding nutritional needs. Reviewed s/sx of heart attack, stroke and worsening symptoms that require immediate medical attention.   Patient Goals/Self-Care Activities: Self-administer medications as prescribed Monitor and record blood pressure Adhere to recommended cardiac prudent/heart healthy diet Follow up with assigned Dental team regarding concerns with chewing Notify provider or care management team with questions and new concerns as needed   Follow Up Plan:  Will follow up next month      PLAN: A member of the care management team will follow up next month.   [facility] Care Management [facility] [phone]

## 2021-07-26 ENCOUNTER — Ambulatory Visit: Payer: PPO | Admitting: *Deleted

## 2021-07-26 DIAGNOSIS — F419 Anxiety disorder, unspecified: Secondary | ICD-10-CM

## 2021-07-26 DIAGNOSIS — I10 Essential (primary) hypertension: Secondary | ICD-10-CM

## 2021-07-26 NOTE — Patient Instructions (Addendum)
Thank you for allowing the Chronic Care Management team to participate in your care.    Patient Care Plan: Fall Risk (Adult)     Problem Identified: Fall Risk      Long-Range Goal: Absence of Fall and Fall-Related Injury   Start Date: 07/25/2021  Expected End Date: 11/22/2021  Priority: High  Note:   Fall Risk  07/25/2021 07/11/2021 05/23/2021 03/26/2021 09/10/2020  Falls in the past year? _0 0  Number falls in past yr: 0 0 0 0 0  Injury with Fall? _1 0  Risk for fall due to : Impaired balance/gait - History of fall(s);Impaired balance/gait History of fall(s);Impaired balance/gait;Impaired mobility;Impaired vision -  Risk for fall due to: Comment - - - - -  Follow up Falls prevention discussed - Falls prevention discussed Falls evaluation completed Falls evaluation completed     Current Barriers:  Risk for Falls related to Impaired Balance and Gait  Clinical Goal(s):  Over the next 120 days, patient will not experience falls or require hospitalization for fall related injuries.  Interventions:  Collaboration with Delsa Grana, PA-C regarding development and update of comprehensive plan of care as evidenced by provider attestation and co-signature Inter-disciplinary care team collaboration (see longitudinal plan of care) Reviewed medications and discussed potential side effects that may increase risk for falls.  Provided information regarding safety and fall prevention. Reports currently using a cane with all ambulation. Reports last fall was in August of 2021. Fall resulted in a broken hip. Reports experiencing imbalance since this event. Advised to avoid prolonged activity to prevent accident falls. Encouraged to avoid attempting to lift weighted objects and request assistance when needed. Advised to continue using cane with all ambulation. Discussed ability to perform ADL's and tasks in the home. Reports grandson lives in the home. Reports good family support from children that  call everyday. She is able to perform ADL's and small tasks in the home independently.  Declines current need for additional in-home assistance. She is interested in volunteer groups that may be able to assist with yard work.   Self-Care Deficits/Patient Goals:  Utilize assistive device appropriately with all ambulation Ensure pathways are clear and well lit Change positions slowly and use caution when ambulating Wear secure fitting, skid free footwear when ambulating Update the care management team with changes in functional status Notify provider or care management team for questions and new concerns as needed    Follow Up Plan:  Will follow up next month    Patient Care Plan: Hypertension and Hyperlipidemia     Problem Identified: Hypertension and Hyperlipidemia      Long-Range Goal: Hypertension and Hyperlipidemia Monitored   Start Date: 07/25/2021  Expected End Date: 11/22/2021  Priority: High  Note:   Objective:  Last practice recorded BP readings:  BP Readings from Last 3 Encounters:  07/18/21 (!) 164/84  07/11/21 (!) 184/94  05/23/21 (!) 144/84   Most recent eGFR/CrCl: No results found for: EGFR  No components found for: CRCL  Lab Results  Component Value Date   CHOL 208 (H) 03/26/2021   HDL 66 03/26/2021   LDLCALC 124 (H) 03/26/2021   TRIG 79 03/26/2021   CHOLHDL 3.2 03/26/2021     Current Barriers:  Chronic Disease Management support and educational needs related to HTN and HLD.  Case Manager Clinical Goal(s):  Over the next 120 days, patient will demonstrate improved adherence to prescribed treatment plan as evidenced by taking all medications as prescribed,  monitoring and recording blood pressure and adhering to low sodium/DASH diet.   Interventions:  Collaboration with Tapia, Leisa, PA-C regarding development and update of comprehensive plan of care as evidenced by provider attestation and co-signature Inter-disciplinary care team collaboration (see  longitudinal plan of care) Reviewed medications and importance of compliance. Advised to continue taking medications as prescribed. Advised to notify provider if unable to tolerate prescribed regimen. Provided information regarding established blood pressure parameters along with indications for notifying a provider. Reports not monitoring BP. Advised to monitor and record readings.  Discussed compliance with recommended cardiac prudent diet. Encouraged to read nutrition labels, monitor sodium intake and avoid highly processed foods when possible. Discussed nutritional concerns related to difficulty chewing. Currently consuming Boost and almond milk in addition to soft meals. Thoroughly discussed foods and meal options. Advised to follow up with her assigned dental team as recommended. Agreed to update the care management team regarding nutritional needs. Reviewed s/sx of heart attack, stroke and worsening symptoms that require immediate medical attention.   Patient Goals/Self-Care Activities: Self-administer medications as prescribed Monitor and record blood pressure Adhere to recommended cardiac prudent/heart healthy diet Follow up with assigned Dental team regarding concerns with chewing Notify provider or care management team with questions and new concerns as needed   Follow Up Plan:  Will follow up next month      Mrs. Vought verbalized understanding of the information discussed during the telephonic outreach. Declined need for mailed/printed instructions. A member of the care management team will follow up next month.    ,RN [place]/[facility] Care Management [facility] [phone]  

## 2021-07-28 NOTE — Chronic Care Management (AMB) (Signed)
Chronic Care Management    Clinical Social Work Note  07/28/2021 Name: Laura Deleon MRN: [id number] DOB: Apr 06, 1946  Laura Deleon is a 75 y.o. year old female who is a primary care patient of Delsa Grana, [place]. The CCM team was consulted to assist the patient with chronic disease management and/or care coordination needs related to: Mental Health Counseling and Resources.   Engaged with patient by telephone for initial visit in response to provider referral for social work chronic care management and care coordination services.   Consent to Services:  The patient was given the following information about Chronic Care Management services today, agreed to services, and gave verbal consent: 1. CCM service includes personalized support from designated clinical staff supervised by the primary care provider, including individualized plan of care and coordination with other care providers 2. 24/7 contact phone numbers for assistance for urgent and routine care needs. 3. Service will only be billed when office clinical staff spend 20 minutes or more in a month to coordinate care. 4. Only one practitioner may furnish and bill the service in a calendar month. 5.The patient may stop CCM services at any time (effective at the end of the month) by phone call to the office staff. 6. The patient will be responsible for cost sharing (co-pay) of up to 20% of the service fee (after annual deductible is met). Patient agreed to services and consent obtained.  Patient agreed to services and consent obtained.   Assessment: Review of patient past medical history, allergies, medications, and health status, including review of relevant consultants reports was performed today as part of a comprehensive evaluation and provision of chronic care management and care coordination services.     SDOH (Social Determinants of Health) assessments and interventions performed:    Advanced Directives Status: Not addressed  in this encounter.  CCM Care Plan  Allergies  Allergen Reactions   Sulfa Antibiotics Shortness Of Breath   Latex     Outpatient Encounter Medications as of 07/26/2021  Medication Sig Note   ezetimibe (ZETIA) 10 MG tablet TAKE 1 TABLET(10 MG) BY MOUTH DAILY    fluticasone (FLONASE) 50 MCG/ACT nasal spray Place 2 sprays into both nostrils as needed. 03/26/2021: prn   loratadine (CLARITIN) 10 MG tablet Take 1 tablet (10 mg total) by mouth daily as needed for allergies. 03/26/2021: prn   metoprolol succinate (TOPROL-XL) 50 MG 24 hr tablet Take 1 tablet (50 mg total) by mouth daily.    Multiple Vitamin (MULTI-VITAMINS) TABS Take 1 tablet by mouth daily.    No facility-administered encounter medications on file as of 07/26/2021.    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/2017   Osteopenia 09/08/2017   Gallstone 12/16/2016   Calcification of abdominal aorta ([place]) 12/16/2016   Advance care planning 08/19/2016   Abdominal wall hernia 07/19/2015   Allergic rhinitis, seasonal 07/19/2015   Blood glucose elevated 07/19/2015   Hypertension goal BP (blood pressure) < 140/90 07/19/2015   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   Hyperlipidemia LDL goal <70 07/19/2015    Conditions to be addressed/monitored: Anxiety; Limited social support and Mental Health Concerns   Care Plan : Anxiety (Adult)  Updates made  by Elliot Gurney M, LCSW since 07/28/2021 12:00 AM     Problem: Symptoms (Anxiety)      Long-Range Goal: Anxiety Symptoms Monitored and Managed   Start Date: 07/26/2021  This Visit's Progress: On track  Priority: Medium  Note:   Current Barriers:  Chronic Mental Health needs related to  current medical condition Mental Health Concerns  Suicidal Ideation/Homicidal Ideation: No  Clinical Social Work Goal(s):  Over the next 90 days, patient will work with SW bi-weekly by telephone or in person to reduce or manage symptoms related to anxiety patient will work with SW to address concerns related to management of anxiety symptoms  Interventions: Patient interviewed and appropriate assessments performed: PHQ 2 PHQ 9 Patient discussed feelings of anxiety in regards to her blood pressure and difficulty dealing with general isolation Patient discussed increased isolation and discussed desire to increase activity in the community Supportive Counseling provided, positive coping strategies discussed Explored possible community resources to increase involvement including the [employer] [facility]-however patient declined-patient did confirm willingness to consider [place] program as her goal is to stay active Patient discussed plans to spend more time with family giving her "something to look forward to" Active listening / Reflection utilized  Emotional Supportive Provide, self care reinforced Discussed plans with patient for ongoing care management follow up and provided patient with direct contact information for care management team  Patient Self Care Activities:  Performs ADL's independently Performs IADL's independently  Patient Coping Strengths:  Supportive Relationships Family Friends  Patient Self Care Deficits:  Lacks social connections  Initial goal documentation       Follow Up Plan: SW will follow up with patient by phone over the next 14 business days      [employer], [place] Worker  [place]/[facility] Care Management [phone]

## 2021-07-28 NOTE — Patient Instructions (Addendum)
Visit Information  PATIENT GOALS:   Goals Addressed             This Visit's Progress    Manage My Emotions       Timeframe:  Long-Range Goal Priority:  Medium Start Date:   07/26/21                          Expected End Date:  02/23/21                    Follow Up Date 08/09/21    - begin personal counseling-if needed - call and visit an old friend - check out volunteer opportunities - consider participation in silver sneakers  - practice relaxation or meditation daily - talk about feelings with a friend, family or spiritual advisor - practice positive thinking and self-talk    Why is this important?   When you are stressed, down or upset, your body reacts too.  For example, your blood pressure may get higher; you may have a headache or stomachache.  When your emotions get the best of you, your body's ability to fight off cold and flu gets weak.  These steps will help you manage your emotions.     Notes:         Laura Deleon was given information about Care Management services by the embedded care coordination team including:  Care Management services include personalized support from designated clinical staff supervised by her physician, including individualized plan of care and coordination with other care providers 24/7 contact phone numbers for assistance for urgent and routine care needs. The patient may stop CCM services at any time (effective at the end of the month) by phone call to the office staff.  Patient agreed to services and verbal consent obtained.   The patient verbalized understanding of instructions, educational materials, and care plan provided today and declined offer to receive copy of patient instructions, educational materials, and care plan.   Telephone follow up appointment with care management team member scheduled for:08/07/21:1pm  Laura Lasky, LCSW Clinical Social Worker  [facility] Care Management [phone]

## 2021-08-01 ENCOUNTER — Telehealth: Payer: PPO

## 2021-08-06 DIAGNOSIS — H2513 Age-related nuclear cataract, bilateral: Secondary | ICD-10-CM | POA: Diagnosis not present

## 2021-08-07 ENCOUNTER — Ambulatory Visit: Payer: PPO | Admitting: *Deleted

## 2021-08-07 DIAGNOSIS — F419 Anxiety disorder, unspecified: Secondary | ICD-10-CM

## 2021-08-07 DIAGNOSIS — I10 Essential (primary) hypertension: Secondary | ICD-10-CM

## 2021-08-07 NOTE — Chronic Care Management (AMB) (Signed)
Chronic Care Management    Clinical Social Work Note  08/07/2021 Name: Laura Deleon MRN: [id number] DOB: 10/23/1946  Laura Deleon is a 75 y.o. year old female who is a primary care patient of Danelle Berry, [place]. The CCM team was consulted to assist the patient with chronic disease management and/or care coordination needs related to: Mental Health Counseling and Resources.   Engaged with patient by telephone for follow up visit in response to provider referral for social work chronic care management and care coordination services.   Consent to Services:  The patient was given information about Chronic Care Management services, agreed to services, and gave verbal consent prior to initiation of services.  Please see initial visit note for detailed documentation.   Patient agreed to services and consent obtained.   Assessment: Review of patient past medical history, allergies, medications, and health status, including review of relevant consultants reports was performed today as part of a comprehensive evaluation and provision of chronic care management and care coordination services.     SDOH (Social Determinants of Health) assessments and interventions performed:    Advanced Directives Status: Not addressed in this encounter.  CCM Care Plan  Allergies  Allergen Reactions   Sulfa Antibiotics Shortness Of Breath   Latex     Outpatient Encounter Medications as of 08/07/2021  Medication Sig Note   ezetimibe (ZETIA) 10 MG tablet TAKE 1 TABLET(10 MG) BY MOUTH DAILY    fluticasone (FLONASE) 50 MCG/ACT nasal spray Place 2 sprays into both nostrils as needed. 03/26/2021: prn   loratadine (CLARITIN) 10 MG tablet Take 1 tablet (10 mg total) by mouth daily as needed for allergies. 03/26/2021: prn   metoprolol succinate (TOPROL-XL) 50 MG 24 hr tablet Take 1 tablet (50 mg total) by mouth daily.    Multiple Vitamin (MULTI-VITAMINS) TABS Take 1 tablet by mouth daily.    No  facility-administered encounter medications on file as of 08/07/2021.    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/2017   Osteopenia 09/08/2017   Gallstone 12/16/2016   Calcification of abdominal aorta ([facility]) 12/16/2016   Advance care planning 08/19/2016   Abdominal wall hernia 07/19/2015   Allergic rhinitis, seasonal 07/19/2015   Blood glucose elevated 07/19/2015   Hypertension goal BP (blood pressure) < 140/90 07/19/2015   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   Hyperlipidemia LDL goal <70 07/19/2015    Conditions to be addressed/monitored: Anxiety; Limited social support and Mental Health Concerns   Care Plan : Anxiety (Adult)  Updates made by Wenda Overland, LCSW since 08/07/2021 12:00 AM     Problem: Symptoms (Anxiety)      Long-Range Goal: Anxiety Symptoms Monitored and Managed   Start Date: 07/26/2021  This Visit's Progress: On track  Recent Progress: On track  Priority: Medium  Note:   Current Barriers:  Chronic Mental Health needs related to current medical condition Mental Health Concerns  Suicidal Ideation/Homicidal Ideation: No  Clinical Social Work Goal(s):  Over the next 90 days, patient will work with SW bi-weekly by telephone or in person to reduce or manage symptoms related to anxiety patient will work with SW to address concerns related to management of anxiety symptoms  Interventions: Patient's anxiety in regards to her blood pressure and difficulty dealing with general isolation explored Patient confirmed that she has not taken blood pressure in 4 days but states that she is feeling much better-discussed being able to get out of the house today which per  patient is beneficial to her  Additional coping strategies discussed including increased contact with family members and daily bible study-Silver Sneakers recommendation declined Supportive Counseling provided, positive coping strategies reinforced Active listening / Reflection utilized  Emotional Support Provided, self care reinforced Discussed plans with patient for ongoing care management follow up and provided patient with direct contact information for care management team  Patient Self Care Activities:  Performs ADL's independently Performs IADL's independently  Patient Coping Strengths:  Supportive Relationships Family Friends  Patient Self Care Deficits:  Lacks social connections  Initial goal documentation       Follow Up Plan: Appointment scheduled for SW follow up with client by phone on:  08/19/21      Verna Czech, LCSW Clinical Social Worker  [facility] Care Management [phone]

## 2021-08-07 NOTE — Patient Instructions (Signed)
Visit Information   Goals Addressed             This Visit's Progress    Manage My Emotions       Timeframe:  Long-Range Goal Priority:  Medium Start Date:   07/26/21                          Expected End Date:  02/23/21                    Follow Up Date 08/12/21    - begin personal counseling-if needed - call and visit an old friend - check out volunteer opportunities - talk about feelings with a friend, family or spiritual advisor - continue daily bible studies  - practice positive thinking and self-talk    Why is this important?   When you are stressed, down or upset, your body reacts too.  For example, your blood pressure may get higher; you may have a headache or stomachache.  When your emotions get the best of you, your body's ability to fight off cold and flu gets weak.  These steps will help you manage your emotions.     Notes:         The patient verbalized understanding of instructions, educational materials, and care plan provided today and declined offer to receive copy of patient instructions, educational materials, and care plan.   Telephone follow up appointment with care management team member scheduled for: 08/19/21  Verna Czech, LCSW Clinical Social Worker  [facility] Care Management [phone]

## 2021-08-08 ENCOUNTER — Encounter: Payer: Self-pay | Admitting: Unknown Physician Specialty

## 2021-08-08 ENCOUNTER — Ambulatory Visit (INDEPENDENT_AMBULATORY_CARE_PROVIDER_SITE_OTHER): Payer: PPO | Admitting: Unknown Physician Specialty

## 2021-08-08 ENCOUNTER — Other Ambulatory Visit: Payer: Self-pay

## 2021-08-08 VITALS — BP 154/82 | HR 73 | Temp 98.2°F | Resp 14 | Ht 66.0 in | Wt 110.5 lb

## 2021-08-08 DIAGNOSIS — Z23 Encounter for immunization: Secondary | ICD-10-CM | POA: Diagnosis not present

## 2021-08-08 DIAGNOSIS — I10 Essential (primary) hypertension: Secondary | ICD-10-CM | POA: Diagnosis not present

## 2021-08-08 NOTE — Progress Notes (Signed)
BP (!) 154/82   Pulse 73   Temp 98.2 F (36.8 C) (Oral)   Resp 14   Ht 5\' 6"  (1.676 m)   Wt 110 lb 8 oz (50.1 kg)   SpO2 97%   BMI 17.84 kg/m    Subjective:    Patient ID: , female    DOB: 1946-01-31, 75 y.o.   MRN: [id number]  HPI: Laura Deleon is a 75 y.o. female  Chief Complaint  Patient presents with   Follow-up    4 week recheck   Pt is here for f/u of her BP following adding Amlodipine.  However, she never picked up the Amlodipine.    She is checking at home and typically SBP runs from 135-155.  Today she is hurting with weather changes.  No SOB or chest pain.    Relevant past medical, surgical, family and social history reviewed and updated as indicated. Interim medical history since our last visit reviewed. Allergies and medications reviewed and updated.  Review of Systems  Per HPI unless specifically indicated above     Objective:    BP (!) 154/82   Pulse 73   Temp 98.2 F (36.8 C) (Oral)   Resp 14   Ht 5\' 6"  (1.676 m)   Wt 110 lb 8 oz (50.1 kg)   SpO2 97%   BMI 17.84 kg/m   Wt Readings from Last 3 Encounters:  08/08/21 110 lb 8 oz (50.1 kg)  07/11/21 111 lb 8 oz (50.6 kg)  05/23/21 113 lb 6.4 oz (51.4 kg)    Physical Exam Constitutional:      General: She is not in acute distress.    Appearance: Normal appearance. She is well-developed.  HENT:     Head: Normocephalic and atraumatic.  Eyes:     General: Lids are normal. No scleral icterus.       Right eye: No discharge.        Left eye: No discharge.     Conjunctiva/sclera: Conjunctivae normal.  Neck:     Vascular: No carotid bruit or JVD.  Cardiovascular:     Rate and Rhythm: Normal rate and regular rhythm.     Heart sounds: Normal heart sounds.  Pulmonary:     Effort: Pulmonary effort is normal. No respiratory distress.     Breath sounds: Normal breath sounds.  Abdominal:     Palpations: There is no hepatomegaly or splenomegaly.  Musculoskeletal:         General: Normal range of motion.     Cervical back: Normal range of motion and neck supple.  Skin:    General: Skin is warm and dry.     Coloration: Skin is not pale.     Findings: No rash.  Neurological:     Mental Status: She is alert and oriented to person, place, and time.  Psychiatric:        Behavior: Behavior normal.        Thought Content: Thought content normal.        Judgment: Judgment normal.    Results for orders placed or performed in visit on 03/26/21  Lipid panel  Result Value Ref Range   Cholesterol 208 (H) <200 mg/dL   HDL 66 > OR = 50 mg/dL   Triglycerides 79 05/25/21 mg/dL   LDL Cholesterol (Calc) 124 (H) mg/dL (calc)   Total CHOL/HDL Ratio 3.2 <5.0 (calc)   Non-HDL Cholesterol (Calc) 142 (H) <130 mg/dL (calc)  Comprehensive metabolic panel  Result Value Ref Range   Glucose, Bld 100 (H) 65 - 99 mg/dL   BUN 13 7 - 25 mg/dL   Creat 4.08 (L) 1.44 - 0.93 mg/dL   BUN/Creatinine Ratio 22 6 - 22 (calc)   Sodium 133 (L) 135 - 146 mmol/L   Potassium 4.9 3.5 - 5.3 mmol/L   Chloride 95 (L) 98 - 110 mmol/L   CO2 28 20 - 32 mmol/L   Calcium 10.4 8.6 - 10.4 mg/dL   Total Protein 7.6 6.1 - 8.1 g/dL   Albumin 4.6 3.6 - 5.1 g/dL   Globulin 3.0 1.9 - 3.7 g/dL (calc)   AG Ratio 1.5 1.0 - 2.5 (calc)   Total Bilirubin 0.5 0.2 - 1.2 mg/dL   Alkaline phosphatase (APISO) 76 37 - 153 U/L   AST 19 10 - 35 U/L   ALT 12 6 - 29 U/L  CBC with Differential/Platelet  Result Value Ref Range   WBC 7.6 3.8 - 10.8 Thousand/uL   RBC 5.10 3.80 - 5.10 Million/uL   Hemoglobin 14.7 11.7 - 15.5 g/dL   HCT 81.8 56.3 - 14.9 %   MCV 86.5 80.0 - 100.0 fL   MCH 28.8 27.0 - 33.0 pg   MCHC 33.3 32.0 - 36.0 g/dL   RDW 70.2 63.7 - 85.8 %   Platelets 231 140 - 400 Thousand/uL   MPV 9.3 7.5 - 12.5 fL   Neutro Abs 5,252 1,500 - 7,800 cells/uL   Lymphs Abs 1,725 850 - 3,900 cells/uL   Absolute Monocytes 547 200 - 950 cells/uL   Eosinophils Absolute 38 15 - 500 cells/uL   Basophils Absolute 38 0 -  200 cells/uL   Neutrophils Relative % 69.1 %   Total Lymphocyte 22.7 %   Monocytes Relative 7.2 %   Eosinophils Relative 0.5 %   Basophils Relative 0.5 %  VITAMIN D 25 Hydroxy (Vit-D Deficiency, Fractures)  Result Value Ref Range   Vit D, 25-Hydroxy 49 30 - 100 ng/mL      Assessment & Plan:   Problem List Items Addressed This Visit       Unprioritized   Hypertension goal BP (blood pressure) < 140/90 (Chronic)    Improved without Amlodipine.  Will not start at this time and will recheck in 3 months.        Other Visit Diagnoses     Need for influenza vaccination    -  Primary   Relevant Orders   Flu Vaccine QUAD High Dose(Fluad) (Completed)        Follow up plan: Return in about 3 months (around 11/07/2021).

## 2021-08-08 NOTE — Assessment & Plan Note (Signed)
Improved without Amlodipine.  Will not start at this time and will recheck in 3 months.

## 2021-08-09 DIAGNOSIS — I10 Essential (primary) hypertension: Secondary | ICD-10-CM

## 2021-08-09 DIAGNOSIS — E785 Hyperlipidemia, unspecified: Secondary | ICD-10-CM

## 2021-08-15 ENCOUNTER — Telehealth: Payer: PPO

## 2021-08-15 ENCOUNTER — Telehealth: Payer: Self-pay | Admitting: Family Medicine

## 2021-08-15 NOTE — Telephone Encounter (Signed)
Is she ok with having her dental done?

## 2021-08-15 NOTE — Telephone Encounter (Signed)
Pt seen cheryl on 08-08-2021 and forgot to ask cheryl if she can get her dental work done

## 2021-08-16 NOTE — Telephone Encounter (Signed)
Pt called back for status update on dental inquiry, please advise  Best contact: [phone]

## 2021-08-19 ENCOUNTER — Ambulatory Visit (INDEPENDENT_AMBULATORY_CARE_PROVIDER_SITE_OTHER): Payer: PPO | Admitting: *Deleted

## 2021-08-19 DIAGNOSIS — F419 Anxiety disorder, unspecified: Secondary | ICD-10-CM

## 2021-08-19 DIAGNOSIS — I10 Essential (primary) hypertension: Secondary | ICD-10-CM

## 2021-08-19 NOTE — Chronic Care Management (AMB) (Signed)
Chronic Care Management    Clinical Social Work Note  08/19/2021 Name: Laura Deleon MRN: [id number] DOB: 08-02-1946  Laura Deleon is a 75 y.o. year old female who is a primary care patient of Danelle Berry, [place]. The CCM team was consulted to assist the patient with chronic disease management and/or care coordination needs related to: Mental Health Counseling and Resources.   Engaged with patient by telephone for follow up visit in response to provider referral for social work chronic care management and care coordination services.   Consent to Services:  The patient was given information about Chronic Care Management services, agreed to services, and gave verbal consent prior to initiation of services.  Please see initial visit note for detailed documentation.   Patient agreed to services and consent obtained.   Assessment: Review of patient past medical history, allergies, medications, and health status, including review of relevant consultants reports was performed today as part of a comprehensive evaluation and provision of chronic care management and care coordination services.     SDOH (Social Determinants of Health) assessments and interventions performed:    Advanced Directives Status: Not addressed in this encounter.  CCM Care Plan  Allergies  Allergen Reactions   Sulfa Antibiotics Shortness Of Breath   Latex     Outpatient Encounter Medications as of 08/19/2021  Medication Sig Note   ezetimibe (ZETIA) 10 MG tablet TAKE 1 TABLET(10 MG) BY MOUTH DAILY    fluticasone (FLONASE) 50 MCG/ACT nasal spray Place 2 sprays into both nostrils as needed. 03/26/2021: prn   loratadine (CLARITIN) 10 MG tablet Take 1 tablet (10 mg total) by mouth daily as needed for allergies. 03/26/2021: prn   metoprolol succinate (TOPROL-XL) 50 MG 24 hr tablet Take 1 tablet (50 mg total) by mouth daily.    Multiple Vitamin (MULTI-VITAMINS) TABS Take 1 tablet by mouth daily.    No  facility-administered encounter medications on file as of 08/19/2021.    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/2017   Osteopenia 09/08/2017   Gallstone 12/16/2016   Calcification of abdominal aorta (HCC) 12/16/2016   Advance care planning 08/19/2016   Abdominal wall hernia 07/19/2015   Allergic rhinitis, seasonal 07/19/2015   Blood glucose elevated 07/19/2015   Hypertension goal BP (blood pressure) < 140/90 07/19/2015   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   Hyperlipidemia LDL goal <70 07/19/2015    Conditions to be addressed/monitored: Anxiety; Mental Health Concerns   Care Plan : Anxiety (Adult)  Updates made by Wenda Overland, LCSW since 08/19/2021 12:00 AM     Problem: Symptoms (Anxiety)      Long-Range Goal: Anxiety Symptoms Monitored and Managed   Start Date: 07/26/2021  This Visit's Progress: On track  Recent Progress: On track  Priority: Medium  Note:   Current Barriers:  Chronic Mental Health needs related to current medical condition Mental Health Concerns  Suicidal Ideation/Homicidal Ideation: No  Clinical Social Work Goal(s):  Over the next 90 days, patient will work with SW bi-weekly by telephone or in person to reduce or manage symptoms related to anxiety patient will work with SW to address concerns related to management of anxiety symptoms  Interventions: Patient's anxiety  in regards to her blood pressure and difficulty dealing with general isolation explored Patient confirmed improvement in blood pressure Financial barriers discussed-recommended applying for food stamps through the Department of Social Services Additional coping strategies discussed  including increased contact with family members, daily bible study and making a lists of tasks and working through list at her own pace-to manage anxiety Supportive Counseling provided, positive coping strategies reinforced Active listening / Reflection utilized  Emotional Support Provided, self care reinforced Discussed plans with patient for ongoing care management follow up and provided patient with direct contact information for care management team  Patient Self Care Activities:  Performs ADL's independently Performs IADL's independently  Patient Coping Strengths:  Supportive Relationships Family Friends  Patient Self Care Deficits:  Lacks social connections  Initial goal documentation       Follow Up Plan: SW will follow up with patient by phone over the next 14 business days      [employer], LCSW Clinical Social Worker  [facility] [phone]

## 2021-08-19 NOTE — Patient Instructions (Signed)
Visit Information   Goals Addressed             This Visit's Progress    Manage My Emotions       Timeframe:  Long-Range Goal Priority:  Medium Start Date:   07/26/21                          Expected End Date:  02/23/21                    Follow Up Date 09/02/21   - begin personal counseling-if needed - call and visit an old friend - check out volunteer opportunities - talk about feelings with a friend, family or spiritual advisor - continue daily bible studies  - practice positive thinking and self-talk    Why is this important?   When you are stressed, down or upset, your body reacts too.  For example, your blood pressure may get higher; you may have a headache or stomachache.  When your emotions get the best of you, your body's ability to fight off cold and flu gets weak.  These steps will help you manage your emotions.     Notes:         The patient verbalized understanding of instructions, educational materials, and care plan provided today and declined offer to receive copy of patient instructions, educational materials, and care plan.   Telephone follow up appointment with care management team member scheduled for:09/02/21  Verna Czech, LCSW Clinical Social Worker  [facility] [phone]

## 2021-08-22 NOTE — Telephone Encounter (Signed)
Reached out to pt but no answer 

## 2021-08-23 NOTE — Telephone Encounter (Signed)
Reached out to [employer] to re fax papers needed for pt.

## 2021-09-02 ENCOUNTER — Ambulatory Visit: Payer: PPO | Admitting: *Deleted

## 2021-09-02 DIAGNOSIS — F419 Anxiety disorder, unspecified: Secondary | ICD-10-CM

## 2021-09-02 DIAGNOSIS — I10 Essential (primary) hypertension: Secondary | ICD-10-CM

## 2021-09-02 NOTE — Patient Instructions (Signed)
Visit Information   Goals Addressed             This Visit's Progress    Manage My Emotions       Timeframe:  Long-Range Goal Priority:  Medium Start Date:   07/26/21                          Expected End Date:  02/23/21                    Follow Up Date 09/16/21   - begin personal counseling-if needed - call and visit an old friend - check out volunteer opportunities - talk about feelings with a friend, family or spiritual advisor - continue daily bible studies  - practice positive thinking and self-talk    Why is this important?   When you are stressed, down or upset, your body reacts too.  For example, your blood pressure may get higher; you may have a headache or stomachache.  When your emotions get the best of you, your body's ability to fight off cold and flu gets weak.  These steps will help you manage your emotions.     Notes:         The patient verbalized understanding of instructions, educational materials, and care plan provided today and declined offer to receive copy of patient instructions, educational materials, and care plan.   Telephone follow up appointment with care management team member scheduled for: 09/16/21  Verna Czech, LCSW Clinical Social Worker  [facility] [phone]
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
## 2021-09-09 DIAGNOSIS — I10 Essential (primary) hypertension: Secondary | ICD-10-CM | POA: Diagnosis not present

## 2021-09-16 ENCOUNTER — Ambulatory Visit (INDEPENDENT_AMBULATORY_CARE_PROVIDER_SITE_OTHER): Payer: PPO | Admitting: *Deleted

## 2021-09-16 DIAGNOSIS — I10 Essential (primary) hypertension: Secondary | ICD-10-CM

## 2021-09-16 DIAGNOSIS — F419 Anxiety disorder, unspecified: Secondary | ICD-10-CM

## 2021-09-16 NOTE — Chronic Care Management (AMB) (Signed)
Chronic Care Management    Clinical Social Work Note  09/16/2021 Name: Laura Deleon MRN: [id number] DOB: 04/28/1946  Laura Deleon is a 75 y.o. year old female who is a primary care patient of Danelle Berry, [place]. The CCM team was consulted to assist the patient with chronic disease management and/or care coordination needs related to: Mental Health Counseling and Resources.   Engaged with patient by telephone for follow up visit in response to provider referral for social work chronic care management and care coordination services.   Consent to Services:  The patient was given information about Chronic Care Management services, agreed to services, and gave verbal consent prior to initiation of services.  Please see initial visit note for detailed documentation.   Patient agreed to services and consent obtained.   Assessment: Review of patient past medical history, allergies, medications, and health status, including review of relevant consultants reports was performed today as part of a comprehensive evaluation and provision of chronic care management and care coordination services.     SDOH (Social Determinants of Health) assessments and interventions performed:    Advanced Directives Status: Not addressed in this encounter.  CCM Care Plan  Allergies  Allergen Reactions   Sulfa Antibiotics Shortness Of Breath   Latex     Outpatient Encounter Medications as of 09/16/2021  Medication Sig Note   ezetimibe (ZETIA) 10 MG tablet TAKE 1 TABLET(10 MG) BY MOUTH DAILY    fluticasone (FLONASE) 50 MCG/ACT nasal spray Place 2 sprays into both nostrils as needed. 03/26/2021: prn   loratadine (CLARITIN) 10 MG tablet Take 1 tablet (10 mg total) by mouth daily as needed for allergies. 03/26/2021: prn   metoprolol succinate (TOPROL-XL) 50 MG 24 hr tablet Take 1 tablet (50 mg total) by mouth daily.    Multiple Vitamin (MULTI-VITAMINS) TABS Take 1 tablet by mouth daily.    No  facility-administered encounter medications on file as of 09/16/2021.    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/2017   Osteopenia 09/08/2017   Gallstone 12/16/2016   Calcification of abdominal aorta (HCC) 12/16/2016   Advance care planning 08/19/2016   Abdominal wall hernia 07/19/2015   Allergic rhinitis, seasonal 07/19/2015   Blood glucose elevated 07/19/2015   Hypertension goal BP (blood pressure) < 140/90 07/19/2015   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   Hyperlipidemia LDL goal <70 07/19/2015    Conditions to be addressed/monitored: Anxiety; Mental Health Concerns   Care Plan : Anxiety (Adult)  Updates made by Wenda Overland, LCSW since 09/16/2021 12:00 AM     Problem: Symptoms (Anxiety)      Long-Range Goal: Anxiety Symptoms Monitored and Managed   Start Date: 07/26/2021  This Visit's Progress: On track  Recent Progress: On track  Priority: Medium  Note:   Current Barriers:  Chronic Mental Health needs related to current medical condition Mental Health Concerns  Suicidal Ideation/Homicidal Ideation: No  Clinical Social Work Goal(s):  Over the next 90 days, patient will work with SW bi-weekly by telephone or in person to reduce or manage symptoms related to anxiety patient will work with SW to address concerns related to management of anxiety symptoms  Interventions:  Patient's  challenges with anxiety related to feelings of isolation and family conflicts Patient discussed family challenges  Patient's safety continues to be confirmed, confirmed that family meeting took place on 11/6 to reinforce safety plan Verbalization of feelings encouraged, self care and safety  prioritized Supportive Counseling provided, positive coping strategies reinforced Active listening / Reflection utilized  Safety emphasized Discussed plans with patient for ongoing care management follow up and provided patient with direct contact information for care management team  Patient Self Care Activities:  Performs ADL's independently Performs IADL's independently  Patient Coping Strengths:  Supportive Relationships Family Friends  Patient Self Care Deficits:  Lacks social connections  Please see past updates related to this goal by clicking on the "Past Updates" button in the selected goal        Follow Up Plan: SW will follow up with patient by phone over the next 14 business days      [employer], LCSW Clinical Social Worker  [facility] [phone]

## 2021-09-16 NOTE — Patient Instructions (Addendum)
Visit Information  PATIENT GOALS/PLAN OF CARE:  Care Plan : Anxiety (Adult)  Updates made by Wenda Overland, LCSW since 09/16/2021 12:00 AM     Problem: Symptoms (Anxiety)      Long-Range Goal: Anxiety Symptoms Monitored and Managed   Start Date: 07/26/2021  This Visit's Progress: On track  Recent Progress: On track  Priority: Medium  Note:   Current Barriers:  Chronic Mental Health needs related to current medical condition Mental Health Concerns  Suicidal Ideation/Homicidal Ideation: No  Clinical Social Work Goal(s):  Over the next 90 days, patient will work with SW bi-weekly by telephone or in person to reduce or manage symptoms related to anxiety patient will work with SW to address concerns related to management of anxiety symptoms  Interventions:  Patient's challenges with anxiety related to feelings of isolation and family conflicts Patient discussed family challenges  Patient's safety continues to be confirmed, confirmed that family meeting took place on 11/6 to reinforce safety plan Verbalization of feelings encouraged, self care and safety prioritized Supportive Counseling provided, positive coping strategies reinforced Active listening / Reflection utilized  Safety emphasized Discussed plans with patient for ongoing care management follow up and provided patient with direct contact information for care management team  Patient Self Care Activities:  Performs ADL's independently Performs IADL's independently  Patient Coping Strengths:  Supportive Relationships Family Friends  Patient Self Care Deficits:  Lacks social connections  Please see past updates related to this goal by clicking on the "Past Updates" button in the selected goal       The patient verbalized understanding of instructions, educational materials, and care plan provided today and declined offer to receive copy of patient instructions, educational materials, and care plan.   Telephone  follow up appointment with care management team member scheduled for:09/30/21 Verna Czech, LCSW Clinical Social Worker  [facility] [phone]

## 2021-09-26 ENCOUNTER — Ambulatory Visit: Payer: PPO | Admitting: Family Medicine

## 2021-09-30 ENCOUNTER — Ambulatory Visit: Payer: PPO | Admitting: *Deleted

## 2021-09-30 DIAGNOSIS — I10 Essential (primary) hypertension: Secondary | ICD-10-CM

## 2021-09-30 DIAGNOSIS — F419 Anxiety disorder, unspecified: Secondary | ICD-10-CM

## 2021-09-30 NOTE — Chronic Care Management (AMB) (Signed)
Chronic Care Management    Clinical Social Work Note  09/30/2021 Name: Laura Deleon MRN: [id number] DOB: 1946-04-26  Laura Deleon is a 75 y.o. year old female who is a primary care patient of Danelle Berry, [place]. The CCM team was consulted to assist the patient with chronic disease management and/or care coordination needs related to: Mental Health Counseling and Resources.   Engaged with patient by telephone for follow up visit in response to provider referral for social work chronic care management and care coordination services.   Consent to Services:  The patient was given information about Chronic Care Management services, agreed to services, and gave verbal consent prior to initiation of services.  Please see initial visit note for detailed documentation.   Patient agreed to services and consent obtained.   Assessment: Review of patient past medical history, allergies, medications, and health status, including review of relevant consultants reports was performed today as part of a comprehensive evaluation and provision of chronic care management and care coordination services.     SDOH (Social Determinants of Health) assessments and interventions performed:    Advanced Directives Status: Not addressed in this encounter.  CCM Care Plan  Allergies  Allergen Reactions   Sulfa Antibiotics Shortness Of Breath   Latex     Outpatient Encounter Medications as of 09/30/2021  Medication Sig Note   ezetimibe (ZETIA) 10 MG tablet TAKE 1 TABLET(10 MG) BY MOUTH DAILY    fluticasone (FLONASE) 50 MCG/ACT nasal spray Place 2 sprays into both nostrils as needed. 03/26/2021: prn   loratadine (CLARITIN) 10 MG tablet Take 1 tablet (10 mg total) by mouth daily as needed for allergies. 03/26/2021: prn   metoprolol succinate (TOPROL-XL) 50 MG 24 hr tablet Take 1 tablet (50 mg total) by mouth daily.    Multiple Vitamin (MULTI-VITAMINS) TABS Take 1 tablet by mouth daily.    No  facility-administered encounter medications on file as of 09/30/2021.    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/2017   Osteopenia 09/08/2017   Gallstone 12/16/2016   Calcification of abdominal aorta (HCC) 12/16/2016   Advance care planning 08/19/2016   Abdominal wall hernia 07/19/2015   Allergic rhinitis, seasonal 07/19/2015   Blood glucose elevated 07/19/2015   Hypertension goal BP (blood pressure) < 140/90 07/19/2015   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   Hyperlipidemia LDL goal <70 07/19/2015    Conditions to be addressed/monitored: Anxiety; Mental Health Concerns   Care Plan : Anxiety (Adult)  Updates made by Wenda Overland, LCSW since 09/30/2021 12:00 AM     Problem: Symptoms (Anxiety)      Long-Range Goal: Anxiety Symptoms Monitored and Managed   Start Date: 07/26/2021  Recent Progress: On track  Priority: Medium  Note:   Current Barriers:  Chronic Mental Health needs related to current medical condition Mental Health Concerns  Suicidal Ideation/Homicidal Ideation: No  Clinical Social Work Goal(s):  Over the next 90 days, patient will work with SW bi-weekly by telephone or in person to reduce or manage symptoms related to anxiety patient will work with SW to address concerns related to management of anxiety symptoms  Interventions:  Patient's challenges with anxiety related to feelings  of isolation and family conflicts Patient discussed family challenges  Patient's safety continues to be confirmed, confirmed that family meeting took place on 11/6 to reinforce safety plan Verbalization of feelings encouraged, self care and safety prioritized Supportive Counseling  provided, positive coping strategies reinforced Active listening / Reflection utilized  Safety emphasized Discussed plans with patient for ongoing care management follow up and provided patient with direct contact information for care management team 09/30/21 Phone call to patient for scheduled follow up appointment. Patient was not at home during the time of the call and was agreeable to rescheduling the appointment. Patient states that she was doing well and was spending time with family. Appointment rescheduled for 10/11/21 Patient Self Care Activities:  Performs ADL's independently Performs IADL's independently  Patient Coping Strengths:  Supportive Relationships Family Friends  Patient Self Care Deficits:  Lacks social connections  Please see past updates related to this goal by clicking on the "Past Updates" button in the selected goal        Follow Up Plan: SW will follow up with patient by phone over the next 14 business days      [employer], LCSW Clinical Social Worker  [facility] Care Management [phone]

## 2021-09-30 NOTE — Patient Instructions (Addendum)
Visit Information  Thank you for taking time to visit with me today. Please don't hesitate to contact me if I can be of assistance to you before our next scheduled telephone appointment.  Following are the goals we discussed today:  Appointment rescheduled   Our next appointment is by telephone on 10/11/21 at 1:30  Please call the care guide team at [phone] if you need to cancel or reschedule your appointment.   Please call 911 call the Suicide and Crisis Lifeline: 988 call the [place] National Suicide Prevention Lifeline: 402-552-6733 call 1-800-273-TALK (toll free, 24 hour hotline) if you are experiencing a Mental Health or Behavioral Health Crisis or need someone to talk to.   Following is a copy of your full plan of care:  Care Plan : Anxiety (Adult)  Updates made by Wenda Overland, LCSW since 09/30/2021 12:00 AM     Problem: Symptoms (Anxiety)      Long-Range Goal: Anxiety Symptoms Monitored and Managed   Start Date: 07/26/2021  Recent Progress: On track  Priority: Medium  Note:   Current Barriers:  Chronic Mental Health needs related to current medical condition Mental Health Concerns  Suicidal Ideation/Homicidal Ideation: No  Clinical Social Work Goal(s):  Over the next 90 days, patient will work with SW bi-weekly by telephone or in person to reduce or manage symptoms related to anxiety patient will work with SW to address concerns related to management of anxiety symptoms  Interventions:  Patient's challenges with anxiety related to feelings of isolation and family conflicts Patient discussed family challenges  Patient's safety continues to be confirmed, confirmed that family meeting took place on 11/6 to reinforce safety plan Verbalization of feelings encouraged, self care and safety prioritized Supportive Counseling provided, positive coping strategies reinforced Active listening / Reflection utilized  Safety emphasized Discussed plans with patient for  ongoing care management follow up and provided patient with direct contact information for care management team 09/30/21 Phone call to patient for scheduled follow up appointment. Patient was not at home during the time of the call and was agreeable to rescheduling the appointment. Patient states that she was doing well and was spending time with family. Appointment rescheduled for 10/11/21 Patient Self Care Activities:  Performs ADL's independently Performs IADL's independently  Patient Coping Strengths:  Supportive Relationships Family Friends  Patient Self Care Deficits:  Lacks social connections  Please see past updates related to this goal by clicking on the "Past Updates" button in the selected goal       Ms. Paletta was given information about Care Management services by the embedded care coordination team including:  Care Management services include personalized support from designated clinical staff supervised by her physician, including individualized plan of care and coordination with other care providers 24/7 contact phone numbers for assistance for urgent and routine care needs. The patient may stop CCM services at any time (effective at the end of the month) by phone call to the office staff.  Patient agreed to services and verbal consent obtained.   The patient verbalized understanding of instructions, educational materials, and care plan provided today and declined offer to receive copy of patient instructions, educational materials, and care plan.   Telephone follow up appointment with care management team member scheduled for: 10/11/21  Verna Czech, LCSW Clinical Social Worker  [facility] Care Management [phone]

## 2021-10-04 ENCOUNTER — Other Ambulatory Visit: Payer: Self-pay | Admitting: Unknown Physician Specialty

## 2021-10-05 NOTE — Telephone Encounter (Signed)
Requested Prescriptions  Pending Prescriptions Disp Refills  . ezetimibe (ZETIA) 10 MG tablet [Pharmacy Med Name: EZETIMIBE 10MG  TABLETS] 30 tablet 0    Sig: TAKE 1 TABLET(10 MG) BY MOUTH DAILY     Cardiovascular:  Antilipid - Sterol Transport Inhibitors Failed - 10/04/2021 11:23 AM      Failed - Total Cholesterol in normal range and within 360 days    Cholesterol, Total  Date Value Ref Range Status  07/20/2015 245 (H) 100 - 199 mg/dL Final   Cholesterol  Date Value Ref Range Status  03/26/2021 208 (H) <200 mg/dL Final         Failed - LDL in normal range and within 360 days    LDL Cholesterol (Calc)  Date Value Ref Range Status  03/26/2021 124 (H) mg/dL (calc) Final    Comment:    Reference range: <100 . Desirable range <100 mg/dL for primary prevention;   <70 mg/dL for patients with CHD or diabetic patients  with > or = 2 CHD risk factors. 03/28/2021 LDL-C is now calculated using the Martin-Hopkins  calculation, which is a validated novel method providing  better accuracy than the Friedewald equation in the  estimation of LDL-C.  Marland Kitchen et al. Horald Pollen. Lenox Ahr): 2061-2068  (http://education.[employer].com/faq/FAQ164)          Passed - HDL in normal range and within 360 days    HDL  Date Value Ref Range Status  03/26/2021 66 > OR = 50 mg/dL Final  03/28/2021 49 68/34/1962 mg/dL Final    Comment:    According to ATP-III Guidelines, HDL-C >59 mg/dL is considered a negative risk factor for CHD.          Passed - Triglycerides in normal range and within 360 days    Triglycerides  Date Value Ref Range Status  03/26/2021 79 <150 mg/dL Final         Passed - Valid encounter within last 12 months    Recent Outpatient Visits          1 month ago Need for influenza vaccination   [facility], NP   2 months ago Routine general medical examination at a health care facility   [facility], NP   6 months ago  Osteoporosis with current pathological fracture with routine healing, unspecified osteoporosis type, subsequent encounter   [facility], NP   1 year ago Essential hypertension   [facility], PA-C   1 year ago Essential hypertension   [facility], PA-C      Future Appointments            In 2 weeks Danelle Berry, PA-C [facility]   In 8 months  [facility], [facility]

## 2021-10-09 DIAGNOSIS — I10 Essential (primary) hypertension: Secondary | ICD-10-CM

## 2021-10-11 ENCOUNTER — Ambulatory Visit (INDEPENDENT_AMBULATORY_CARE_PROVIDER_SITE_OTHER): Payer: PPO | Admitting: *Deleted

## 2021-10-11 DIAGNOSIS — F419 Anxiety disorder, unspecified: Secondary | ICD-10-CM

## 2021-10-11 DIAGNOSIS — I10 Essential (primary) hypertension: Secondary | ICD-10-CM

## 2021-10-11 NOTE — Patient Instructions (Signed)
Visit Information  Thank you for taking time to visit with me today. Please don't hesitate to contact me if I can be of assistance to you before our next scheduled telephone appointment.  Following are the goals we discussed today:   - begin personal counseling-if needed - call and visit an old friend - check out volunteer opportunities - talk about feelings with a friend, family or spiritual advisor - continue daily bible studies  - practice positive thinking and self-talk   Our next appointment is by telephone on 10/25/21 at 1:30pm  Please call the care guide team at [phone] if you need to cancel or reschedule your appointment.   If you are experiencing a Mental Health or Behavioral Health Crisis or need someone to talk to, please call the Suicide and Crisis Lifeline: 988   Following is a copy of your full plan of care:  Care Plan : Anxiety (Adult)  Updates made by Laura Overland, LCSW since 10/11/2021 12:00 AM     Problem: Symptoms (Anxiety)      Long-Range Goal: Anxiety Symptoms Monitored and Managed   Start Date: 07/26/2021  This Visit's Progress: On track  Recent Progress: On track  Priority: Medium  Note:   Current Barriers:  Chronic Mental Health needs related to current medical condition Mental Health Concerns  Suicidal Ideation/Homicidal Ideation: No  Clinical Social Work Goal(s):  Over the next 90 days, patient will work with SW bi-weekly by telephone or in person to reduce or manage symptoms related to anxiety patient will work with SW to address concerns related to management of anxiety symptoms  Interventions:  Patient's challenges with anxiety related to feelings of isolation and family conflicts Family relationships improving, anxiety symptoms improving, reports doing more socially-even on her own Patient confirmed that "everything is falling into place" patient confirms that her strong faith has helped her Verbalization of feelings encouraged, self  care and increasing social activities prioritized Supportive Counseling/emotional support provided related to anxiety symptoms and maintaining self management strategies-positive reinforcement provided for continued plan to increase social activities, use of positive self talk and focusing on what she can control Active listening / Reflection/Motivational interviewing utilized  Discussed plans with patient for ongoing care management follow up and provided patient with direct contact information for care management team  Patient Self Care Activities:  Performs ADL's independently Performs IADL's independently  Patient Coping Strengths:  Supportive Relationships Family Friends  Patient Self Care Deficits:  Lacks social connections  Please see past updates related to this goal by clicking on the "Past Updates" button in the selected goal       Laura Deleon was given information about Care Management services by the embedded care coordination team including:  Care Management services include personalized support from designated clinical staff supervised by her physician, including individualized plan of care and coordination with other care providers 24/7 contact phone numbers for assistance for urgent and routine care needs. The patient may stop CCM services at any time (effective at the end of the month) by phone call to the office staff.  Patient agreed to services and verbal consent obtained.   The patient verbalized understanding of instructions, educational materials, and care plan provided today and declined offer to receive copy of patient instructions, educational materials, and care plan.   Telephone follow up appointment with care management team member scheduled for:10/25/21  Laura Czech, LCSW Clinical Social Worker  [facility] Care Management [phone]

## 2021-10-11 NOTE — Chronic Care Management (AMB) (Signed)
Chronic Care Management    Clinical Social Work Note  10/11/2021 Name: Laura Deleon MRN: [id number] DOB: Aug 28, 1946  Laura Deleon is a 75 y.o. year old female who is a primary care patient of Danelle Berry, [place]. The CCM team was consulted to assist the patient with chronic disease management and/or care coordination needs related to: Mental Health Counseling and Resources.   Engaged with patient by telephone for follow up visit in response to provider referral for social work chronic care management and care coordination services.   Consent to Services:  The patient was given information about Chronic Care Management services, agreed to services, and gave verbal consent prior to initiation of services.  Please see initial visit note for detailed documentation.   Patient agreed to services and consent obtained.   Assessment: Review of patient past medical history, allergies, medications, and health status, including review of relevant consultants reports was performed today as part of a comprehensive evaluation and provision of chronic care management and care coordination services.     SDOH (Social Determinants of Health) assessments and interventions performed:    Advanced Directives Status: Not addressed in this encounter.  CCM Care Plan  Allergies  Allergen Reactions   Sulfa Antibiotics Shortness Of Breath   Latex     Outpatient Encounter Medications as of 10/11/2021  Medication Sig Note   ezetimibe (ZETIA) 10 MG tablet TAKE 1 TABLET(10 MG) BY MOUTH DAILY    fluticasone (FLONASE) 50 MCG/ACT nasal spray Place 2 sprays into both nostrils as needed. 03/26/2021: prn   loratadine (CLARITIN) 10 MG tablet Take 1 tablet (10 mg total) by mouth daily as needed for allergies. 03/26/2021: prn   metoprolol succinate (TOPROL-XL) 50 MG 24 hr tablet Take 1 tablet (50 mg total) by mouth daily.    Multiple Vitamin (MULTI-VITAMINS) TABS Take 1 tablet by mouth daily.    No  facility-administered encounter medications on file as of 10/11/2021.    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/2017   Osteopenia 09/08/2017   Gallstone 12/16/2016   Calcification of abdominal aorta (HCC) 12/16/2016   Advance care planning 08/19/2016   Abdominal wall hernia 07/19/2015   Allergic rhinitis, seasonal 07/19/2015   Blood glucose elevated 07/19/2015   Hypertension goal BP (blood pressure) < 140/90 07/19/2015   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   Hyperlipidemia LDL goal <70 07/19/2015    Conditions to be addressed/monitored: Anxiety; Mental Health Concerns   Care Plan : Anxiety (Adult)  Updates made by Wenda Overland, LCSW since 10/11/2021 12:00 AM     Problem: Symptoms (Anxiety)      Long-Range Goal: Anxiety Symptoms Monitored and Managed   Start Date: 07/26/2021  This Visit's Progress: On track  Recent Progress: On track  Priority: Medium  Note:   Current Barriers:  Chronic Mental Health needs related to current medical condition Mental Health Concerns  Suicidal Ideation/Homicidal Ideation: No  Clinical Social Work Goal(s):  Over the next 90 days, patient will work with SW bi-weekly by telephone or in person to reduce or manage symptoms related to anxiety patient will work with SW to address concerns related to management of anxiety symptoms  Interventions:  Patient's  challenges with anxiety related to feelings of isolation and family conflicts Family relationships improving, anxiety symptoms improving, reports doing more socially-even on her own Patient confirmed that "everything is falling into place" patient confirms that her strong faith has helped  her Verbalization of feelings encouraged, self care and increasing social activities prioritized Supportive Counseling/emotional support provided related to anxiety symptoms and maintaining self management strategies-positive reinforcement provided for continued plan to increase social activities, use of positive self talk and focusing on what she can control Active listening / Reflection/Motivational interviewing utilized  Discussed plans with patient for ongoing care management follow up and provided patient with direct contact information for care management team  Patient Self Care Activities:  Performs ADL's independently Performs IADL's independently  Patient Coping Strengths:  Supportive Relationships Family Friends  Patient Self Care Deficits:  Lacks social connections  Please see past updates related to this goal by clicking on the "Past Updates" button in the selected goal        Follow Up Plan: SW will follow up with patient by phone over the next 14 business days      [employer], LCSW Clinical Social Worker  [facility] Care Management [phone]

## 2021-10-15 ENCOUNTER — Telehealth: Payer: Self-pay | Admitting: Family Medicine

## 2021-10-15 NOTE — Telephone Encounter (Signed)
Requested medications are due for refill today.  yes  Requested medications are on the active medications list.  yes  Last refill. 10/05/2021  Future visit scheduled.   yes  Notes to clinic.  Pt already given a courtesy refill.    Requested Prescriptions  Pending Prescriptions Disp Refills   ezetimibe (ZETIA) 10 MG tablet 30 tablet 0     Cardiovascular:  Antilipid - Sterol Transport Inhibitors Failed - 10/15/2021  4:10 PM      Failed - Total Cholesterol in normal range and within 360 days    Cholesterol, Total  Date Value Ref Range Status  07/20/2015 245 (H) 100 - 199 mg/dL Final   Cholesterol  Date Value Ref Range Status  03/26/2021 208 (H) <200 mg/dL Final          Failed - LDL in normal range and within 360 days    LDL Cholesterol (Calc)  Date Value Ref Range Status  03/26/2021 124 (H) mg/dL (calc) Final    Comment:    Reference range: <100 . Desirable range <100 mg/dL for primary prevention;   <70 mg/dL for patients with CHD or diabetic patients  with > or = 2 CHD risk factors. Marland Kitchen LDL-C is now calculated using the Martin-Hopkins  calculation, which is a validated novel method providing  better accuracy than the Friedewald equation in the  estimation of LDL-C.  Horald Pollen et al. Lenox Ahr. 1610;960(45): 2061-2068  (http://education.[facility].com/faq/FAQ164)           Passed - HDL in normal range and within 360 days    HDL  Date Value Ref Range Status  03/26/2021 66 > OR = 50 mg/dL Final  40/98/1191 49 >47 mg/dL Final    Comment:    According to ATP-III Guidelines, HDL-C >59 mg/dL is considered a negative risk factor for CHD.           Passed - Triglycerides in normal range and within 360 days    Triglycerides  Date Value Ref Range Status  03/26/2021 79 <150 mg/dL Final          Passed - Valid encounter within last 12 months    Recent Outpatient Visits           2 months ago Need for influenza vaccination   [facility]  Gabriel Cirri, NP   3 months ago Routine general medical examination at a health care facility   [facility] Gabriel Cirri, NP   6 months ago Osteoporosis with current pathological fracture with routine healing, unspecified osteoporosis type, subsequent encounter   [facility] Gabriel Cirri, NP   1 year ago Essential hypertension   [facility] Danelle Berry, PA-C   1 year ago Essential hypertension   [facility] Danelle Berry, PA-C       Future Appointments             In 1 month Danelle Berry, PA-C [facility]   In 7 months  [facility]

## 2021-10-15 NOTE — Telephone Encounter (Signed)
Medication Refill - Medication:  ezetimibe (ZETIA) 10 MG tablet   Has the patient contacted their pharmacy? Yes.   Contact PCP  Preferred Pharmacy (with phone number or street name):  [id number] - [place], [place] - [address]  [address], [place] [address]  Phone:  [phone]  Fax:  [phone]  Has the patient been seen for an appointment in the last year OR does the patient have an upcoming appointment? Yes.    Agent: Please be advised that RX refills may take up to 3 business days. We ask that you follow-up with your pharmacy. 

## 2021-10-16 NOTE — Telephone Encounter (Signed)
Appt scheduled with Della Goo for 12.28.2022

## 2021-10-21 ENCOUNTER — Ambulatory Visit: Payer: PPO | Admitting: Family Medicine

## 2021-10-25 ENCOUNTER — Ambulatory Visit: Payer: PPO | Admitting: *Deleted

## 2021-10-25 DIAGNOSIS — F419 Anxiety disorder, unspecified: Secondary | ICD-10-CM

## 2021-10-25 DIAGNOSIS — I10 Essential (primary) hypertension: Secondary | ICD-10-CM

## 2021-10-25 NOTE — Chronic Care Management (AMB) (Signed)
Chronic Care Management    Clinical Social Work Note  10/25/2021 Name: Kaarin Pardy MRN: [id number] DOB: 1946-02-17  Alen Blew Tatro is a 75 y.o. year old female who is a primary care patient of Danelle Berry, [place]. The CCM team was consulted to assist the patient with chronic disease management and/or care coordination needs related to: Mental Health Counseling and Resources.   Engaged with patient by telephone for follow up visit in response to provider referral for social work chronic care management and care coordination services.   Consent to Services:  The patient was given information about Chronic Care Management services, agreed to services, and gave verbal consent prior to initiation of services.  Please see initial visit note for detailed documentation.   Patient agreed to services and consent obtained.   Assessment: Review of patient past medical history, allergies, medications, and health status, including review of relevant consultants reports was performed today as part of a comprehensive evaluation and provision of chronic care management and care coordination services.     SDOH (Social Determinants of Health) assessments and interventions performed:    Advanced Directives Status: Not addressed in this encounter.  CCM Care Plan  Allergies  Allergen Reactions   Sulfa Antibiotics Shortness Of Breath   Latex     Outpatient Encounter Medications as of 10/25/2021  Medication Sig Note   ezetimibe (ZETIA) 10 MG tablet TAKE 1 TABLET(10 MG) BY MOUTH DAILY    fluticasone (FLONASE) 50 MCG/ACT nasal spray Place 2 sprays into both nostrils as needed. 03/26/2021: prn   loratadine (CLARITIN) 10 MG tablet Take 1 tablet (10 mg total) by mouth daily as needed for allergies. 03/26/2021: prn   metoprolol succinate (TOPROL-XL) 50 MG 24 hr tablet Take 1 tablet (50 mg total) by mouth daily.    Multiple Vitamin (MULTI-VITAMINS) TABS Take 1 tablet by mouth daily.    No  facility-administered encounter medications on file as of 10/25/2021.    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/2017   Osteopenia 09/08/2017   Gallstone 12/16/2016   Calcification of abdominal aorta (HCC) 12/16/2016   Advance care planning 08/19/2016   Abdominal wall hernia 07/19/2015   Allergic rhinitis, seasonal 07/19/2015   Blood glucose elevated 07/19/2015   Hypertension goal BP (blood pressure) < 140/90 07/19/2015   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   Hyperlipidemia LDL goal <70 07/19/2015    Conditions to be addressed/monitored: Anxiety; Mental Health Concerns   Care Plan : Anxiety (Adult)  Updates made by Wenda Overland, LCSW since 10/25/2021 12:00 AM     Problem: Symptoms (Anxiety)      Long-Range Goal: Anxiety Symptoms Monitored and Managed   Start Date: 07/26/2021  This Visit's Progress: On track  Recent Progress: On track  Priority: Medium  Note:   Current Barriers:  Chronic Mental Health needs related to current medical condition Mental Health Concerns  Suicidal Ideation/Homicidal Ideation: No  Clinical Social Work Goal(s):  Over the next 90 days, patient will work with SW bi-weekly by telephone or in person to reduce or manage symptoms related to anxiety patient will work with SW to address concerns related to management of anxiety symptoms  Interventions:  Patient's  challenges with anxiety related to feelings of isolation and family conflicts Patient currently recovering from dental work done this week Family relationships improving, anxiety symptoms improving due to ability to get out and do more Patient confirmed that positive self talk and her strong  faith has helped her Verbalization of feelings encouraged, self care and increasing social activities prioritized Supportive Counseling/emotional support provided related to anxiety symptoms and maintaining self management strategies-positive reinforcement provided for continued plan to increase social activities and use of positive self talk  Active listening / Reflection provided Discussed plans with patient for ongoing care management follow up and provided patient with direct contact information for care management team  Patient Self Care Activities:  Performs ADL's independently Performs IADL's independently  Patient Coping Strengths:  Supportive Relationships Family Friends  Patient Self Care Deficits:  Lacks social connections  Please see past updates related to this goal by clicking on the "Past Updates" button in the selected goal        Follow Up Plan: Appointment scheduled for SW follow up with client by phone on: 11/15/21      Verna Czech, LCSW Clinical Social Worker  [facility] [phone]

## 2021-10-25 NOTE — Patient Instructions (Signed)
Visit Information  Thank you for taking time to visit with me today. Please don't hesitate to contact me if I can be of assistance to you before our next scheduled telephone appointment.  Following are the goals we discussed today:   - begin personal counseling-if needed - call and visit an old friend - check out volunteer opportunities - talk about feelings with a friend, family or spiritual advisor - continue daily bible studies  - practice positive thinking and self-talk   Our next appointment is by telephone on 11/15/21 at 3pm  Please call the care guide team at [phone] if you need to cancel or reschedule your appointment.   If you are experiencing a Mental Health or Behavioral Health Crisis or need someone to talk to, please call the Suicide and Crisis Lifeline: 988   The patient verbalized understanding of instructions, educational materials, and care plan provided today and declined offer to receive copy of patient instructions, educational materials, and care plan.   Telephone follow up appointment with care management team member scheduled for: 11/15/21  Verna Czech, LCSW Clinical Social Worker  [facility] [phone]

## 2021-11-06 ENCOUNTER — Other Ambulatory Visit: Payer: Self-pay

## 2021-11-06 ENCOUNTER — Ambulatory Visit (INDEPENDENT_AMBULATORY_CARE_PROVIDER_SITE_OTHER): Payer: PPO | Admitting: Nurse Practitioner

## 2021-11-06 ENCOUNTER — Encounter: Payer: Self-pay | Admitting: Nurse Practitioner

## 2021-11-06 VITALS — BP 140/80 | HR 83 | Temp 98.3°F | Resp 14 | Ht 66.0 in | Wt 109.4 lb

## 2021-11-06 DIAGNOSIS — E785 Hyperlipidemia, unspecified: Secondary | ICD-10-CM

## 2021-11-06 DIAGNOSIS — Z79899 Other long term (current) drug therapy: Secondary | ICD-10-CM

## 2021-11-06 DIAGNOSIS — I10 Essential (primary) hypertension: Secondary | ICD-10-CM

## 2021-11-06 MED ORDER — METOPROLOL SUCCINATE ER 50 MG PO TB24: 50.0000 mg | ORAL_TABLET | Freq: Every day | ORAL | 1 refills | Status: DC

## 2021-11-06 MED ORDER — EZETIMIBE 10 MG PO TABS: ORAL_TABLET | ORAL | 1 refills | Status: DC

## 2021-11-06 NOTE — Progress Notes (Signed)
BP 140/80    Pulse 83    Temp 98.3 F (36.8 C) (Oral)    Resp 14    Ht 5\' 6"  (1.676 m)    Wt 109 lb 6.4 oz (49.6 kg)    SpO2 98%    BMI 17.66 kg/m    Subjective:    Patient ID: , female    DOB: 1946/01/18, 75 y.o.   MRN: [id number]  HPI: Laura Deleon is a 75 y.o. female, here alone  Chief Complaint  Patient presents with   Hyperlipidemia   Hypertension    Follow up   Hyperlipidemia:  She is currently taking zetia 10 mg daily.  She says she just took her last pill today.  Will give refill.  She denies any side effects.  Last LDL was 124 on 03/26/21, will order labs, she will come back and have them drawn later.   HTN:  Blood pressure was slightly elevated when she arrived at 154/82.  She says she recently had dental work and is in a little pain.  Recheck is 140/80.  She is currently taking metoprolol 50 mg daily.  She denies any chest pain, shortness of breath, headaches or blurred vision.   Relevant past medical, surgical, family and social history reviewed and updated as indicated. Interim medical history since our last visit reviewed. Allergies and medications reviewed and updated.  Review of Systems  Constitutional: Negative for fever or weight change.  Respiratory: Negative for cough and shortness of breath.   Cardiovascular: Negative for chest pain or palpitations.  Gastrointestinal: Negative for abdominal pain, no bowel changes.  Musculoskeletal: Negative for gait problem or joint swelling.  Skin: Negative for rash.  Neurological: Negative for dizziness or headache.  No other specific complaints in a complete review of systems (except as listed in HPI above).      Objective:    BP 140/80    Pulse 83    Temp 98.3 F (36.8 C) (Oral)    Resp 14    Ht 5\' 6"  (1.676 m)    Wt 109 lb 6.4 oz (49.6 kg)    SpO2 98%    BMI 17.66 kg/m   Wt Readings from Last 3 Encounters:  11/06/21 109 lb 6.4 oz (49.6 kg)  08/08/21 110 lb 8 oz (50.1 kg)  07/11/21 111 lb 8  oz (50.6 kg)    Physical Exam  Constitutional: Patient appears well-developed and well-nourished. No distress.  HEENT: head atraumatic, normocephalic, pupils equal and reactive to light, neck supple Cardiovascular: Normal rate, regular rhythm and normal heart sounds.  No murmur heard. No BLE edema. Pulmonary/Chest: Effort normal and breath sounds normal. No respiratory distress. Abdominal: Soft.  There is no tenderness. Psychiatric: Patient has a normal mood and affect. behavior is normal. Judgment and thought content normal.   Results for orders placed or performed in visit on 03/26/21  Lipid panel  Result Value Ref Range   Cholesterol 208 (H) <200 mg/dL   HDL 66 > OR = 50 mg/dL   Triglycerides 79 09/10/21 mg/dL   LDL Cholesterol (Calc) 124 (H) mg/dL (calc)   Total CHOL/HDL Ratio 3.2 <5.0 (calc)   Non-HDL Cholesterol (Calc) 142 (H) <130 mg/dL (calc)  Comprehensive metabolic panel  Result Value Ref Range   Glucose, Bld 100 (H) 65 - 99 mg/dL   BUN 13 7 - 25 mg/dL   Creat 03/28/21 (L) <937 - 0.93 mg/dL   BUN/Creatinine Ratio 22 6 - 22 (calc)  Sodium 133 (L) 135 - 146 mmol/L   Potassium 4.9 3.5 - 5.3 mmol/L   Chloride 95 (L) 98 - 110 mmol/L   CO2 28 20 - 32 mmol/L   Calcium 10.4 8.6 - 10.4 mg/dL   Total Protein 7.6 6.1 - 8.1 g/dL   Albumin 4.6 3.6 - 5.1 g/dL   Globulin 3.0 1.9 - 3.7 g/dL (calc)   AG Ratio 1.5 1.0 - 2.5 (calc)   Total Bilirubin 0.5 0.2 - 1.2 mg/dL   Alkaline phosphatase (APISO) 76 37 - 153 U/L   AST 19 10 - 35 U/L   ALT 12 6 - 29 U/L  CBC with Differential/Platelet  Result Value Ref Range   WBC 7.6 3.8 - 10.8 Thousand/uL   RBC 5.10 3.80 - 5.10 Million/uL   Hemoglobin 14.7 11.7 - 15.5 g/dL   HCT 22.2 97.9 - 89.2 %   MCV 86.5 80.0 - 100.0 fL   MCH 28.8 27.0 - 33.0 pg   MCHC 33.3 32.0 - 36.0 g/dL   RDW 11.9 41.7 - 40.8 %   Platelets 231 140 - 400 Thousand/uL   MPV 9.3 7.5 - 12.5 fL   Neutro Abs 5,252 1,500 - 7,800 cells/uL   Lymphs Abs 1,725 850 - 3,900 cells/uL    Absolute Monocytes 547 200 - 950 cells/uL   Eosinophils Absolute 38 15 - 500 cells/uL   Basophils Absolute 38 0 - 200 cells/uL   Neutrophils Relative % 69.1 %   Total Lymphocyte 22.7 %   Monocytes Relative 7.2 %   Eosinophils Relative 0.5 %   Basophils Relative 0.5 %  VITAMIN D 25 Hydroxy (Vit-D Deficiency, Fractures)  Result Value Ref Range   Vit D, 25-Hydroxy 49 30 - 100 ng/mL      Assessment & Plan:   1. Hyperlipidemia LDL goal <70  - ezetimibe (ZETIA) 10 MG tablet; TAKE 1 TABLET(10 MG) BY MOUTH DAILY  Dispense: 90 tablet; Refill: 1 - COMPLETE METABOLIC PANEL WITH GFR - Lipid panel  2. Essential hypertension  - metoprolol succinate (TOPROL-XL) 50 MG 24 hr tablet; Take 1 tablet (50 mg total) by mouth daily.  Dispense: 90 tablet; Refill: 1 - CBC with Differential/Platelet - COMPLETE METABOLIC PANEL WITH GFR  3. Medication management  - CBC with Differential/Platelet - COMPLETE METABOLIC PANEL WITH GFR - Lipid panel   Follow up plan: Return in about 6 months (around 05/07/2022) for follow up.

## 2021-11-07 DIAGNOSIS — I10 Essential (primary) hypertension: Secondary | ICD-10-CM | POA: Diagnosis not present

## 2021-11-07 DIAGNOSIS — E785 Hyperlipidemia, unspecified: Secondary | ICD-10-CM | POA: Diagnosis not present

## 2021-11-07 DIAGNOSIS — Z79899 Other long term (current) drug therapy: Secondary | ICD-10-CM | POA: Diagnosis not present

## 2021-11-08 ENCOUNTER — Other Ambulatory Visit: Payer: Self-pay | Admitting: Nurse Practitioner

## 2021-11-08 DIAGNOSIS — E785 Hyperlipidemia, unspecified: Secondary | ICD-10-CM

## 2021-11-08 LAB — CBC WITH DIFFERENTIAL/PLATELET
Absolute Monocytes: 549 cells/uL (ref 200–950)
Basophils Absolute: 40 cells/uL (ref 0–200)
Basophils Relative: 0.6 %
Eosinophils Absolute: 107 cells/uL (ref 15–500)
Eosinophils Relative: 1.6 %
HCT: 43.9 % (ref 35.0–45.0)
Hemoglobin: 14.9 g/dL (ref 11.7–15.5)
Lymphs Abs: 2204 cells/uL (ref 850–3900)
MCH: 29.7 pg (ref 27.0–33.0)
MCHC: 33.9 g/dL (ref 32.0–36.0)
MCV: 87.5 fL (ref 80.0–100.0)
MPV: 9.7 fL (ref 7.5–12.5)
Monocytes Relative: 8.2 %
Neutro Abs: 3799 cells/uL (ref 1500–7800)
Neutrophils Relative %: 56.7 %
Platelets: 237 10*3/uL (ref 140–400)
RBC: 5.02 10*6/uL (ref 3.80–5.10)
RDW: 12.1 % (ref 11.0–15.0)
Total Lymphocyte: 32.9 %
WBC: 6.7 10*3/uL (ref 3.8–10.8)

## 2021-11-08 LAB — LIPID PANEL
Cholesterol: 189 mg/dL (ref <200)
HDL: 62 mg/dL (ref >=50)
LDL Cholesterol (Calc): 112 mg/dL (calc) — ABNORMAL HIGH
Non-HDL Cholesterol (Calc): 127 mg/dL (calc) (ref <130)
Total CHOL/HDL Ratio: 3 (calc) (ref <5.0)
Triglycerides: 68 mg/dL (ref <150)

## 2021-11-08 LAB — COMPLETE METABOLIC PANEL WITH GFR
AG Ratio: 1.7 (calc) (ref 1.0–2.5)
ALT: 15 U/L (ref 6–29)
AST: 21 U/L (ref 10–35)
Albumin: 4.6 g/dL (ref 3.6–5.1)
Alkaline phosphatase (APISO): 66 U/L (ref 37–153)
BUN: 12 mg/dL (ref 7–25)
CO2: 28 mmol/L (ref 20–32)
Calcium: 9.8 mg/dL (ref 8.6–10.4)
Chloride: 95 mmol/L — ABNORMAL LOW (ref 98–110)
Creat: 0.71 mg/dL (ref 0.60–1.00)
Globulin: 2.7 g/dL (calc) (ref 1.9–3.7)
Glucose, Bld: 91 mg/dL (ref 65–99)
Potassium: 4.3 mmol/L (ref 3.5–5.3)
Sodium: 132 mmol/L — ABNORMAL LOW (ref 135–146)
Total Bilirubin: 0.6 mg/dL (ref 0.2–1.2)
Total Protein: 7.3 g/dL (ref 6.1–8.1)
eGFR: 89 mL/min/{1.73_m2} (ref >=60)

## 2021-11-08 MED ORDER — ATORVASTATIN CALCIUM 10 MG PO TABS: 10.0000 mg | ORAL_TABLET | Freq: Every day | ORAL | 3 refills | Status: DC

## 2021-11-08 NOTE — Progress Notes (Signed)
Patient notified of new medication

## 2021-11-09 DIAGNOSIS — I10 Essential (primary) hypertension: Secondary | ICD-10-CM

## 2021-11-15 ENCOUNTER — Ambulatory Visit (INDEPENDENT_AMBULATORY_CARE_PROVIDER_SITE_OTHER): Payer: PPO | Admitting: *Deleted

## 2021-11-15 DIAGNOSIS — F419 Anxiety disorder, unspecified: Secondary | ICD-10-CM

## 2021-11-15 DIAGNOSIS — I10 Essential (primary) hypertension: Secondary | ICD-10-CM

## 2021-11-16 NOTE — Chronic Care Management (AMB) (Signed)
Chronic Care Management    Clinical Social Work Note  11/16/2021 Name: Laura Deleon MRN: [id number] DOB: 12/25/1945  Laura Deleon is a 76 y.o. year old female who is a primary care patient of Laura Deleon, [place]. The CCM team was consulted to assist the patient with chronic disease management and/or care coordination needs related to: Mental Health Counseling and Resources.   Engaged with patient by telephone for follow up visit in response to provider referral for social work chronic care management and care coordination services.   Consent to Services:  The patient was given information about Chronic Care Management services, agreed to services, and gave verbal consent prior to initiation of services.  Please see initial visit note for detailed documentation.   Patient agreed to services and consent obtained.   Assessment: Review of patient past medical history, allergies, medications, and health status, including review of relevant consultants reports was performed today as part of a comprehensive evaluation and provision of chronic care management and care coordination services.     SDOH (Social Determinants of Health) assessments and interventions performed:    Advanced Directives Status: Not addressed in this encounter.  CCM Care Plan  Allergies  Allergen Reactions   Sulfa Antibiotics Shortness Of Breath   Latex     Outpatient Encounter Medications as of 11/15/2021  Medication Sig Note   atorvastatin (LIPITOR) 10 MG tablet Take 1 tablet (10 mg total) by mouth daily.    ezetimibe (ZETIA) 10 MG tablet TAKE 1 TABLET(10 MG) BY MOUTH DAILY    fluticasone (FLONASE) 50 MCG/ACT nasal spray Place 2 sprays into both nostrils as needed. 03/26/2021: prn   loratadine (CLARITIN) 10 MG tablet Take 1 tablet (10 mg total) by mouth daily as needed for allergies. 03/26/2021: prn   metoprolol succinate (TOPROL-XL) 50 MG 24 hr tablet Take 1 tablet (50 mg total) by mouth daily.    Multiple  Vitamin (MULTI-VITAMINS) TABS Take 1 tablet by mouth daily.    No facility-administered encounter medications on file as of 11/15/2021.    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/2017   Osteopenia 09/08/2017   Gallstone 12/16/2016   Calcification of abdominal aorta (HCC) 12/16/2016   Advance care planning 08/19/2016   Abdominal wall hernia 07/19/2015   Allergic rhinitis, seasonal 07/19/2015   Blood glucose elevated 07/19/2015   Hypertension goal BP (blood pressure) < 140/90 07/19/2015   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   Hyperlipidemia LDL goal <70 07/19/2015    Conditions to be addressed/monitored: Anxiety; Mental Health Concerns   Care Plan : Anxiety (Adult)  Updates made by Laura Overland, LCSW since 11/16/2021 12:00 AM     Problem: Symptoms (Anxiety)      Long-Range Goal: Anxiety Symptoms Monitored and Managed   Start Date: 07/26/2021  This Visit's Progress: On track  Recent Progress: On track  Priority: Medium  Note:   Current Barriers:  Chronic Mental Health needs related to current medical condition Mental Health Concerns  Suicidal Ideation/Homicidal Ideation: No  Clinical Social Work Goal(s):  Over the next 90 days, patient will work with SW bi-weekly by telephone or in person to reduce or manage symptoms related to anxiety patient  will work with SW to address concerns related to management of anxiety symptoms  Interventions:  Patient's challenges with anxiety related to feelings of isolation and family conflicts Patient currently recovering from dental work done-pain continues-slowly improving Family relationships improving, anxiety symptoms improving due to  ability to get out and do more(socialization with family and neighbors has improved) Patient confirmed that positive self talk and her strong faith has helped her Relationship with grandson discussed, safety concerns discussed as well as crisis plan Verbalization of feelings encouraged, self care , safety and increasing social activities prioritized Supportive Counseling/emotional support provided related to anxiety symptoms and maintaining self management strategies-positive reinforcement provided for continued plan to increase social activities and use of positive self talk  Active listening / Reflection provided Discussed plans with patient for ongoing care management follow up and provided patient with direct contact information for care management team  Patient Self Care Activities:  Performs ADL's independently Performs IADL's independently  Patient Coping Strengths:  Supportive Relationships Family Friends  Patient Self Care Deficits:  Lacks social connections  Please see past updates related to this goal by clicking on the "Past Updates" button in the selected goal        Follow Up Plan: SW will follow up with patient by phone over the next 14 business days      [employer], LCSW Clinical Social Worker  [facility] [phone]

## 2021-11-16 NOTE — Patient Instructions (Signed)
Visit Information  Thank you for taking time to visit with me today. Please don't hesitate to contact me if I can be of assistance to you before our next scheduled telephone appointment.  Following are the goals we discussed today:   - begin personal counseling-if needed - call and visit an old friend - check out volunteer opportunities - talk about feelings with a friend, family or spiritual advisor - continue daily bible studies  - practice positive thinking and self-talk   Our next appointment is by telephone on 11/29/21 at 2pm  Please call the care guide team at [phone] if you need to cancel or reschedule your appointment.   If you are experiencing a Mental Health or [place] or need someone to talk to, please call the Suicide and Crisis Lifeline: 988   The patient verbalized understanding of instructions, educational materials, and care plan provided today and declined offer to receive copy of patient instructions, educational materials, and care plan.   Telephone follow up appointment with care management team member scheduled for: 11/29/21  Elliot Gurney, [place] Worker  [facility] [phone]

## 2021-11-18 ENCOUNTER — Ambulatory Visit: Payer: PPO | Admitting: Family Medicine

## 2021-11-23 IMAGING — CR DG CHEST 1V PORT
1 series · 1 of 1 positions shown · non-contrast
Comparison: Chest radiograph 08/17/2018

CLINICAL DATA: Left hip fracture.  Post fall.

EXAM:
PORTABLE CHEST 1 VIEW

[dg chest port 1 view]
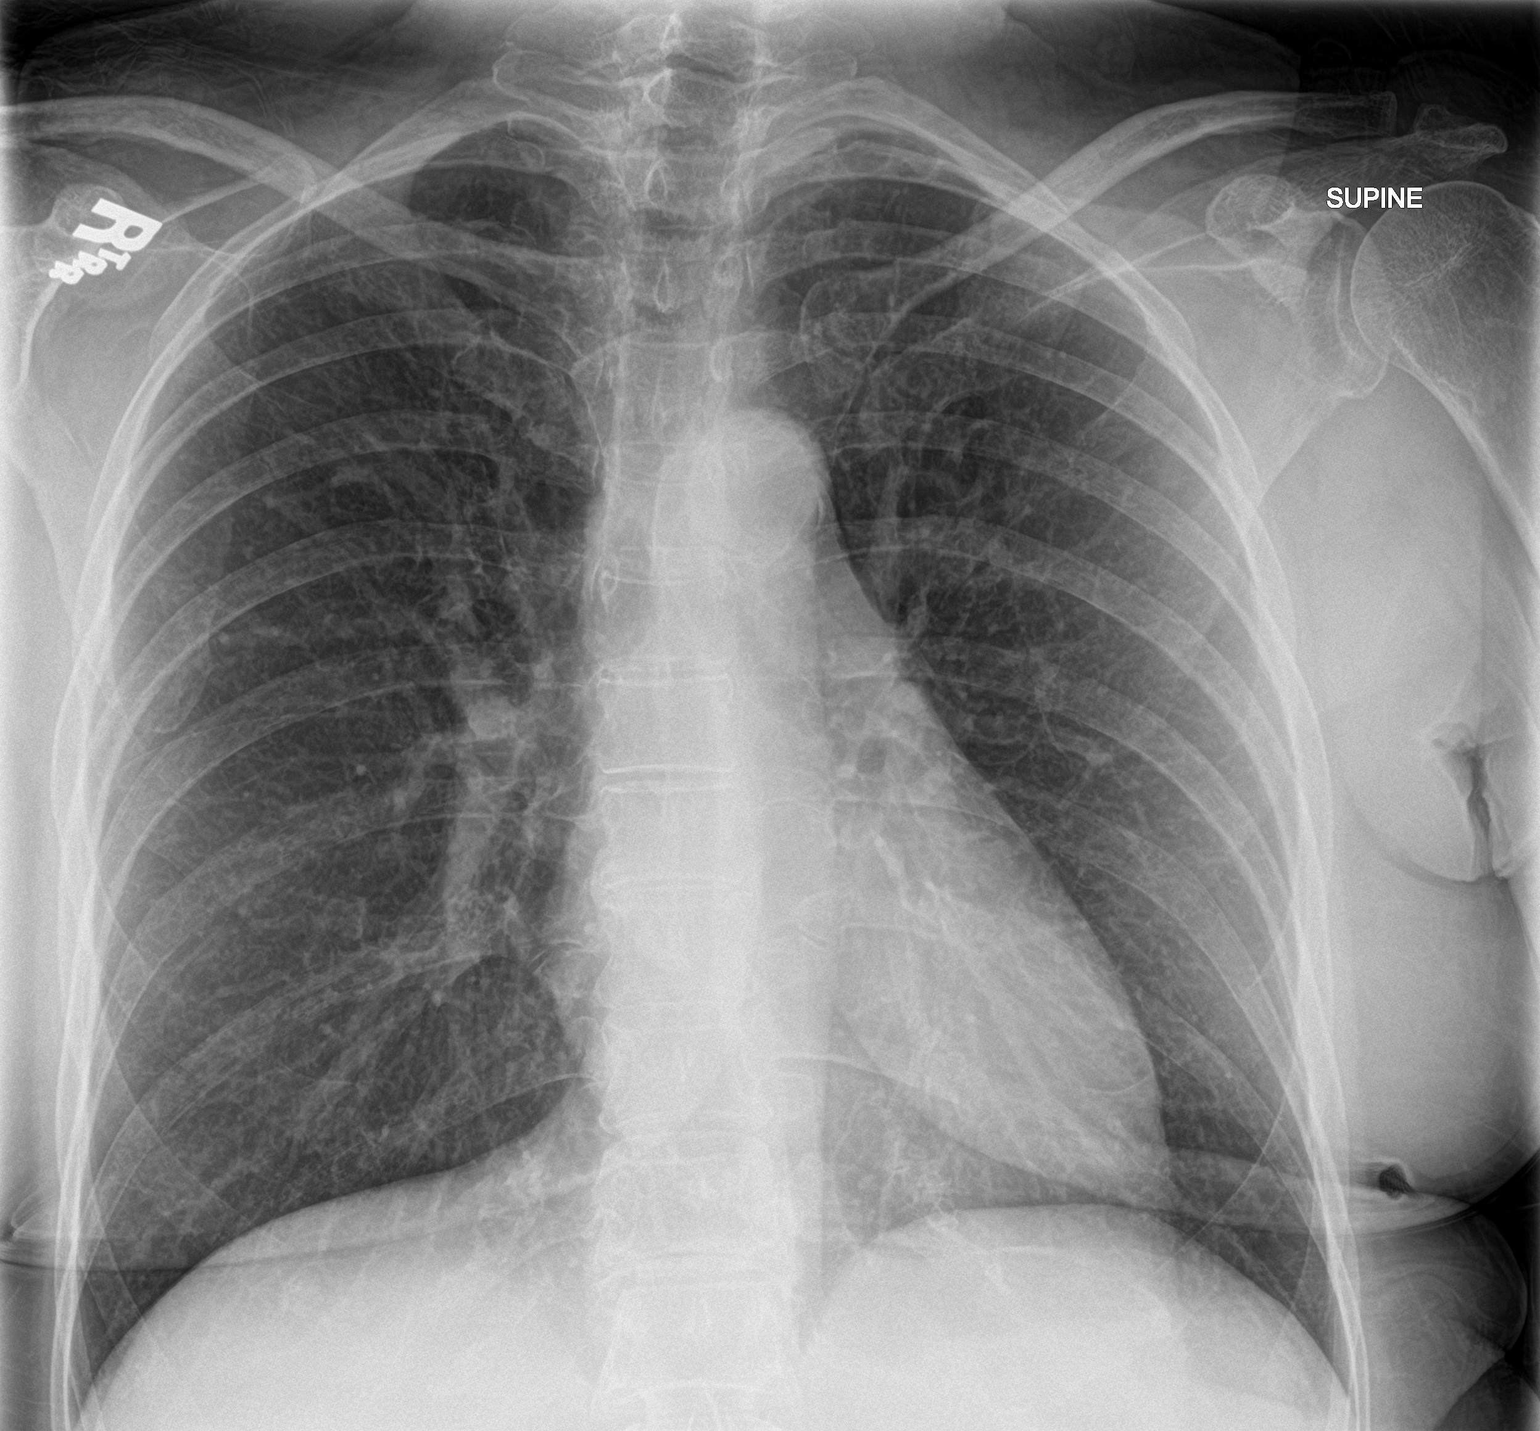

[1 of 1 positions shown; findings below may reference images not displayed]

FINDINGS: The cardiomediastinal contours are normal. Mild aortic
atherosclerosis. Pulmonary vasculature is normal. No consolidation,
pleural effusion, or pneumothorax. No acute osseous abnormalities
are seen.
IMPRESSION: No acute chest findings.

## 2021-11-24 IMAGING — DX DG PORTABLE PELVIS
1 series · 1 of 1 positions shown · non-contrast
Comparison: 07/04/2020

CLINICAL DATA: Postop

EXAM:
PORTABLE PELVIS 1-2 VIEWS

[pelvis ap]
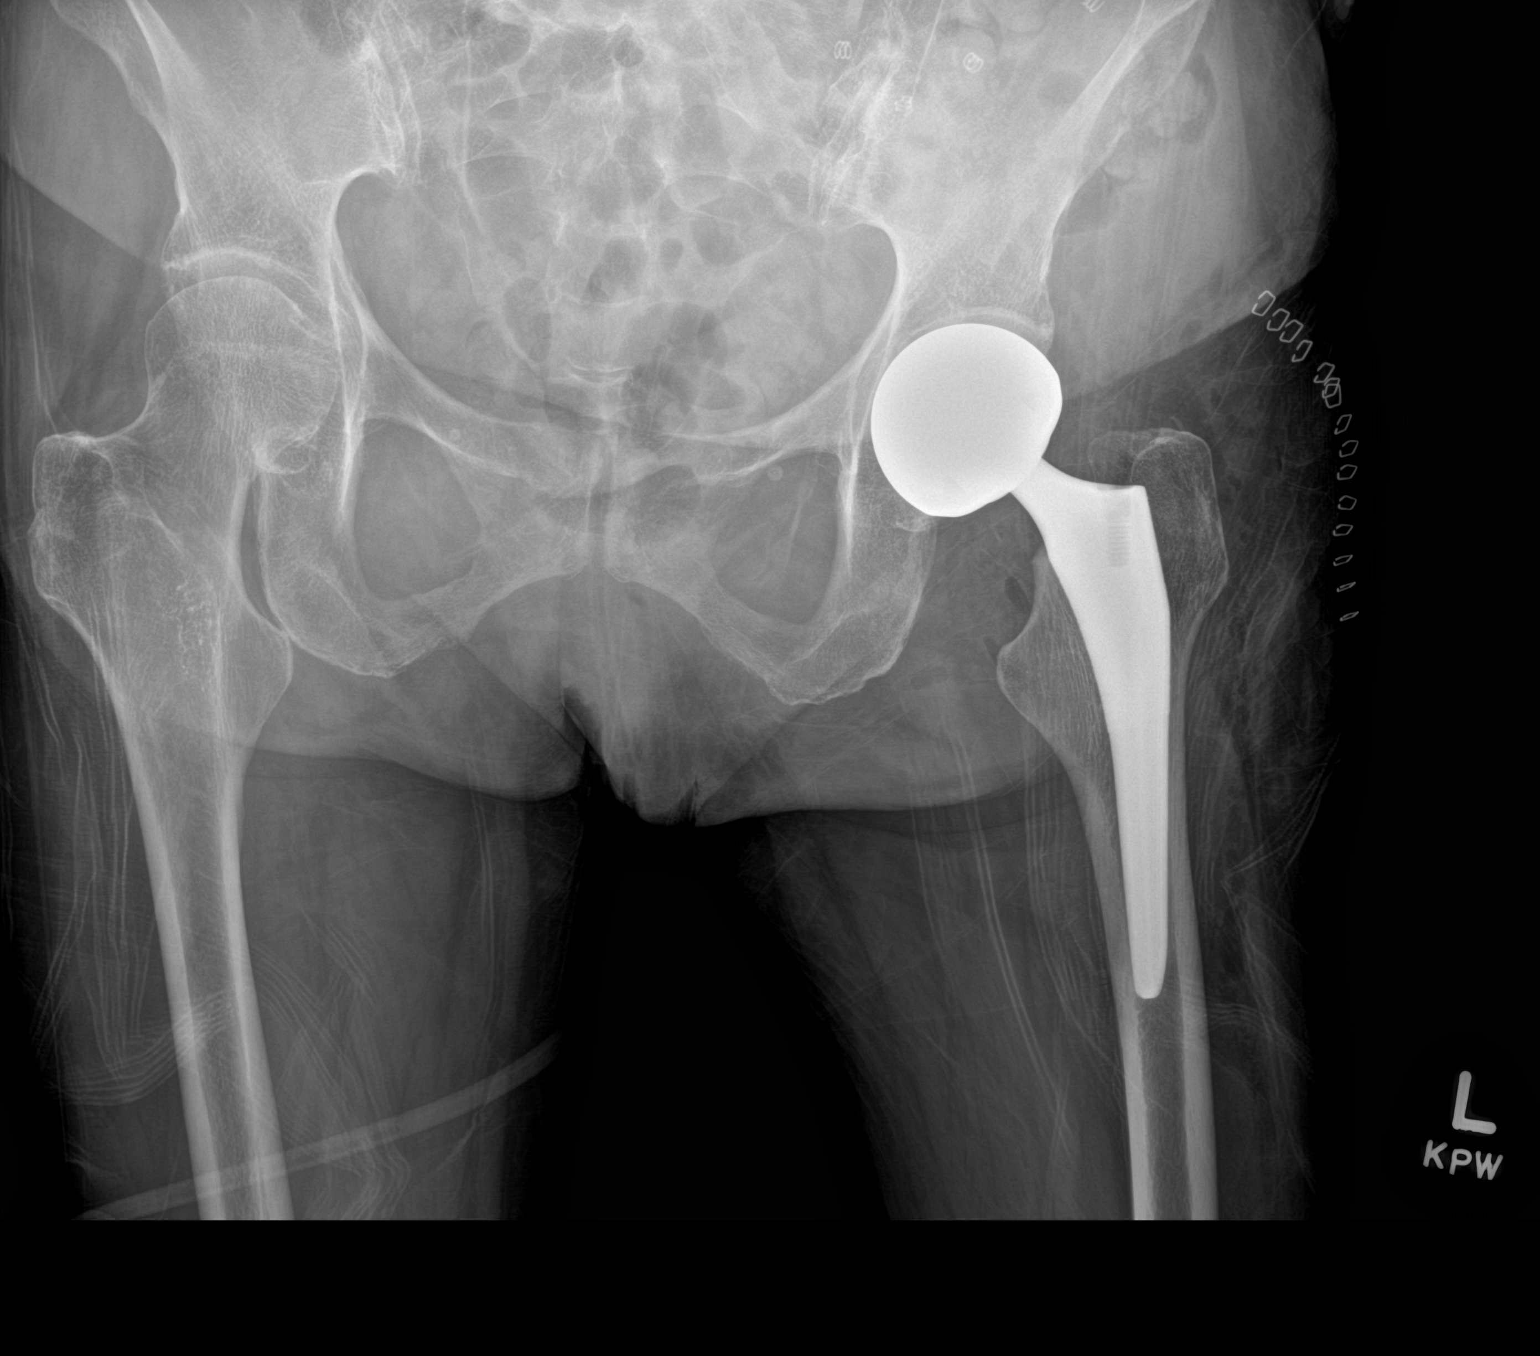

[1 of 1 positions shown; findings below may reference images not displayed]

FINDINGS: Pubic symphysis and rami appear intact. Status post left hip
replacement with intact hardware and normal alignment. Gas in the
soft tissues consistent with recent surgery.
IMPRESSION: Status post left hip replacement with expected postsurgical change.

## 2021-11-24 IMAGING — DX DG PELVIS 1-2V
1 series · 1 of 1 positions shown · non-contrast
Comparison: None.

CLINICAL DATA: Status post left hemiarthroplasty

EXAM:
PELVIS - 1-2 VIEW

[pelvis ap]
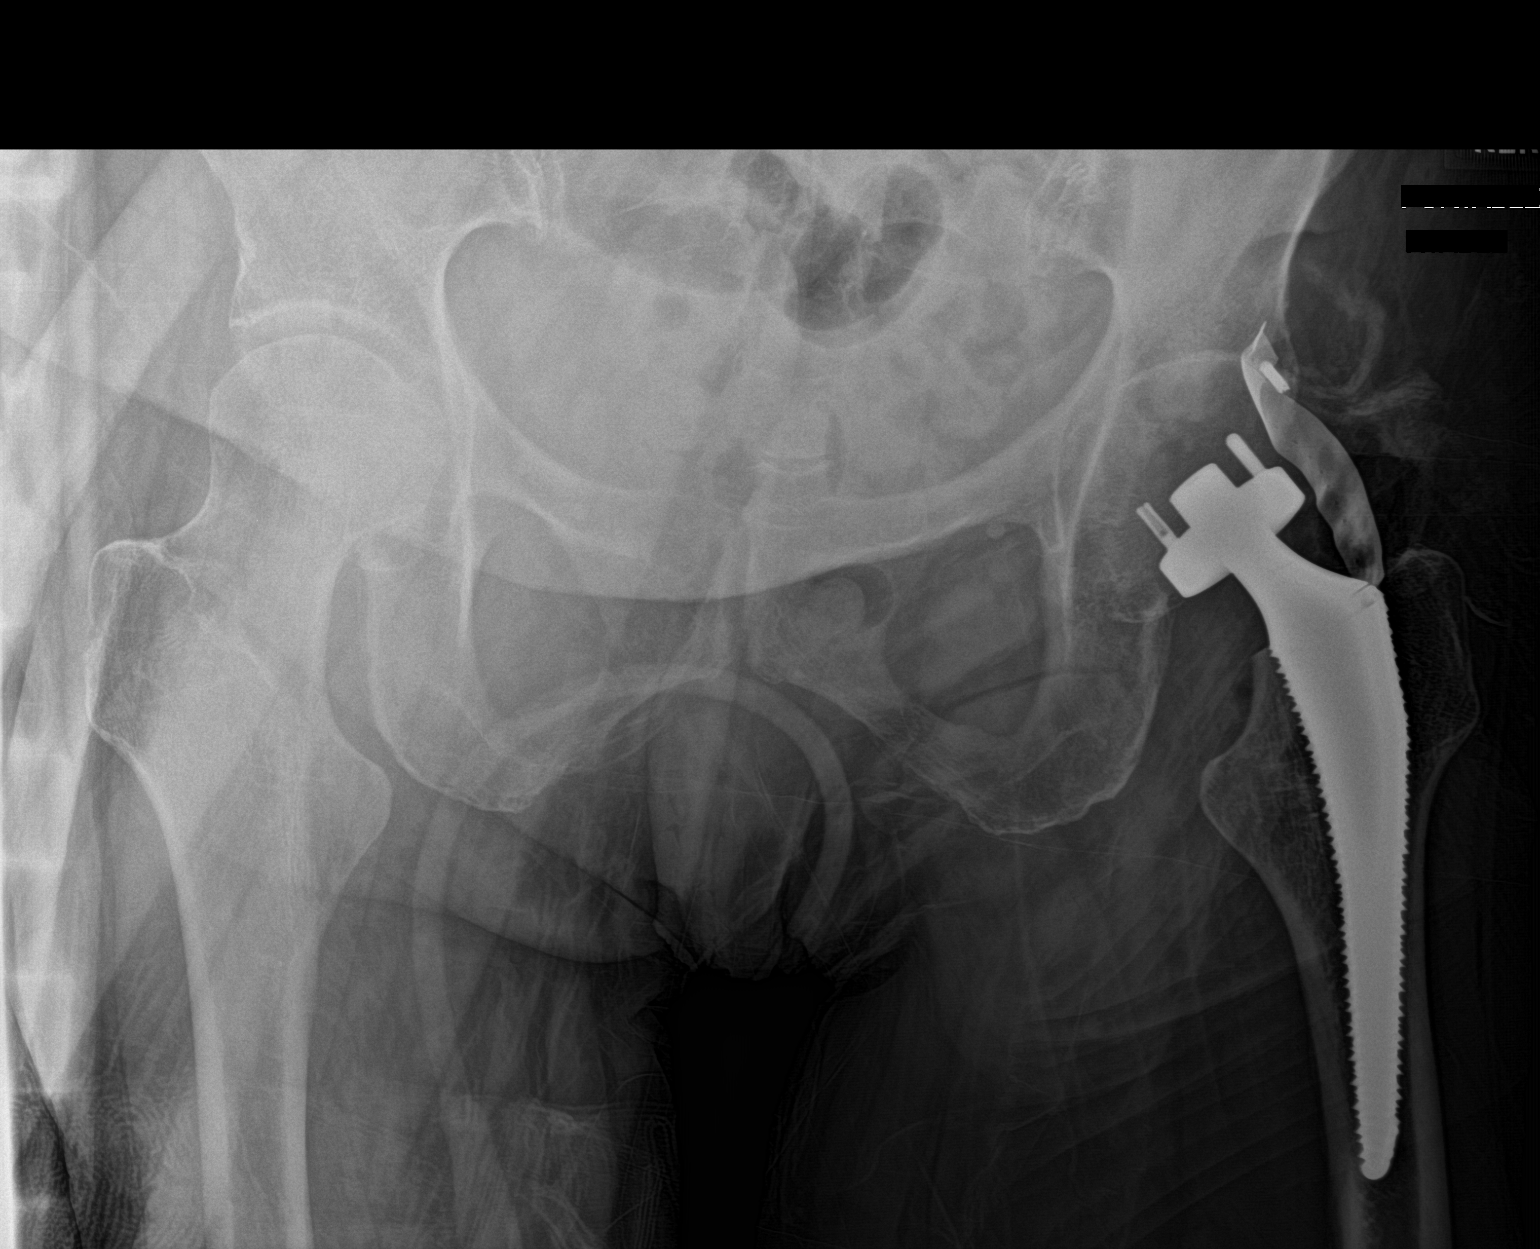

[1 of 1 positions shown; findings below may reference images not displayed]

FINDINGS: Intraoperative frontal view obtained. There is a drilling type
prosthesis in place for hip arthroplasty on the left with the
current prosthesis within the trabecular portion of bone. No
fracture or dislocation. There is an apparent sponge along the
lateral aspect of the left hip joint currently. Right hip joint
appears unremarkable.
IMPRESSION: Apparent drilling prosthesis on the left in essentially anatomic
alignment. No acute fracture or dislocation. Apparent sponge lateral
to the left hip joint.

## 2021-11-29 ENCOUNTER — Telehealth: Payer: PPO

## 2021-12-02 ENCOUNTER — Ambulatory Visit: Payer: PPO | Admitting: *Deleted

## 2021-12-02 ENCOUNTER — Telehealth: Payer: Self-pay | Admitting: *Deleted

## 2021-12-02 ENCOUNTER — Telehealth: Payer: PPO

## 2021-12-02 DIAGNOSIS — F419 Anxiety disorder, unspecified: Secondary | ICD-10-CM

## 2021-12-02 DIAGNOSIS — I10 Essential (primary) hypertension: Secondary | ICD-10-CM

## 2021-12-02 NOTE — Telephone Encounter (Signed)
°  Care Management   Follow Up Note   12/02/2021 Name: Laura Deleon MRN: [id number] DOB: 1946/02/21   Referred by: Danelle Berry, PA-C Reason for referral : Care Coordination   An unsuccessful telephone outreach was attempted today. The patient was referred to the case management team for assistance with care management and care coordination.   Follow Up Plan: Telephone follow up appointment with care management team member scheduled for:12/09/21  Verna Czech, LCSW Clinical Social Worker  [facility] [phone]

## 2021-12-03 NOTE — Patient Instructions (Signed)
Visit Information  Thank you for taking time to visit with me today. Please don't hesitate to contact me if I can be of assistance to you before our next scheduled telephone appointment.  Following are the goals we discussed today:   - begin personal counseling-if needed - call and visit an old friend - check out volunteer opportunities - talk about feelings with a friend, family or spiritual advisor - continue daily bible studies  - practice positive thinking and self-talk     Our next appointment is by telephone on 12/23/21 at 11am  Please call the care guide team at [phone] if you need to cancel or reschedule your appointment.   If you are experiencing a Mental Health or Behavioral Health Crisis or need someone to talk to, please call the Suicide and Crisis Lifeline: 988   Patient verbalizes understanding of instructions and care plan provided today and agrees to view in MyChart. Active MyChart status confirmed with patient.    Telephone follow up appointment with care management team member scheduled for: 12/23/21  Verna Czech, LCSW Clinical Social Worker  [facility] [phone]

## 2021-12-03 NOTE — Chronic Care Management (AMB) (Signed)
Chronic Care Management    Clinical Social Work Note  12/03/2021 Name: Zo Maret MRN: [id number] DOB: 1946/05/29  Joanie Coddington Ley is a 76 y.o. year old female who is a primary care patient of Delsa Grana, [place]. The CCM team was consulted to assist the patient with chronic disease management and/or care coordination needs related to: Mental Health Counseling and Resources.   Engaged with patient by telephone for follow up visit in response to provider referral for social work chronic care management and care coordination services.   Consent to Services:  The patient was given information about Chronic Care Management services, agreed to services, and gave verbal consent prior to initiation of services.  Please see initial visit note for detailed documentation.   Patient agreed to services and consent obtained.   Assessment: Review of patient past medical history, allergies, medications, and health status, including review of relevant consultants reports was performed today as part of a comprehensive evaluation and provision of chronic care management and care coordination services.     SDOH (Social Determinants of Health) assessments and interventions performed:    Advanced Directives Status: Not addressed in this encounter.  CCM Care Plan  Allergies  Allergen Reactions   Sulfa Antibiotics Shortness Of Breath   Latex     Outpatient Encounter Medications as of 12/02/2021  Medication Sig Note   atorvastatin (LIPITOR) 10 MG tablet Take 1 tablet (10 mg total) by mouth daily.    ezetimibe (ZETIA) 10 MG tablet TAKE 1 TABLET(10 MG) BY MOUTH DAILY    fluticasone (FLONASE) 50 MCG/ACT nasal spray Place 2 sprays into both nostrils as needed. 03/26/2021: prn   loratadine (CLARITIN) 10 MG tablet Take 1 tablet (10 mg total) by mouth daily as needed for allergies. 03/26/2021: prn   metoprolol succinate (TOPROL-XL) 50 MG 24 hr tablet Take 1 tablet (50 mg total) by mouth daily.     Multiple Vitamin (MULTI-VITAMINS) TABS Take 1 tablet by mouth daily.    No facility-administered encounter medications on file as of 12/02/2021.    Patient Active Problem List   Diagnosis Date Noted   Anxiety 07/11/2021   Underweight 03/26/2021   Hip fracture due to osteoporosis with routine healing 03/26/2021   Closed left hip fracture ([place]) 07/03/2020   Hx of transient ischemic attack (TIA) 02/17/2019   Peripheral neuropathy 08/17/2018   Contracture of joint of finger of right hand 11/11/2017   Osteopenia 09/08/2017   Gallstone 12/16/2016   Calcification of abdominal aorta ([place]) 12/16/2016   Advance care planning 08/19/2016   Abdominal wall hernia 07/19/2015   Allergic rhinitis, seasonal 07/19/2015   Blood glucose elevated 07/19/2015   Hypertension goal BP (blood pressure) < 140/90 07/19/2015   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   Hyperlipidemia LDL goal <70 07/19/2015    Conditions to be addressed/monitored: Anxiety; Mental Health Concerns   Care Plan : Anxiety (Adult)  Updates made by Vern Claude, LCSW since 12/03/2021 12:00 AM     Problem: Symptoms (Anxiety)      Long-Range Goal: Anxiety Symptoms Monitored and Managed   Start Date: 07/26/2021  This Visit's Progress: On track  Recent Progress: On track  Priority: Medium  Note:   Current Barriers:  Chronic Mental Health needs related to current medical condition Mental Health Concerns  Suicidal Ideation/Homicidal Ideation: No  Clinical Social Work Goal(s):  Over the next 90 days, patient will work with SW bi-weekly by telephone or in person to reduce or manage symptoms related to anxiety patient  will work with SW to address concerns related to management of anxiety symptoms  Interventions:  Patient's challenges with anxiety related to feelings of isolation and family conflicts explored Patient continues to  recover from dental work done-pain continues-slowly improving Family relationships continue to improve,  anxiety symptoms improving evidenced by increased socialization and motivation to "do more". Per patient, she is feeling more energetic Patient confirmed that positive self talk and her strong faith continues to beneficial Relationship grandson discussed, safety concerns discussed as well as crisis plan Verbalization of feelings encouraged, self care , safety and increasing social activities prioritized Supportive Counseling/emotional support provided related to anxiety symptoms and maintaining self management strategies-positive reinforcement provided for continued plan to increase social activities and use of positive self talk  Active listening / Reflection provided Discussed plans with patient for ongoing care management follow up and provided patient with direct contact information for care management team  Patient Self Care Activities:  Performs ADL's independently Performs IADL's independently  Patient Coping Strengths:  Supportive Relationships Family Friends  Patient Self Care Deficits:  Lacks social connections  Please see past updates related to this goal by clicking on the "Past Updates" button in the selected goal        Follow Up Plan: SW will follow up with patient by phone over the next 14 business days      [employer], [place] Worker  [facility] Care Management [phone]

## 2021-12-09 ENCOUNTER — Telehealth: Payer: PPO

## 2021-12-10 DIAGNOSIS — I10 Essential (primary) hypertension: Secondary | ICD-10-CM

## 2021-12-12 ENCOUNTER — Ambulatory Visit (INDEPENDENT_AMBULATORY_CARE_PROVIDER_SITE_OTHER): Payer: PPO

## 2021-12-12 DIAGNOSIS — E785 Hyperlipidemia, unspecified: Secondary | ICD-10-CM

## 2021-12-12 DIAGNOSIS — Z9181 History of falling: Secondary | ICD-10-CM

## 2021-12-12 DIAGNOSIS — I10 Essential (primary) hypertension: Secondary | ICD-10-CM

## 2021-12-12 NOTE — Chronic Care Management (AMB) (Signed)
Chronic Care Management   CCM RN Visit Note  12/12/2021 Name: Laura Deleon MRN: [id number] DOB: 1946-01-21  Subjective: Laura Deleon is a 76 y.o. year old female who is a primary care patient of Delsa Grana, [place]. The care management team was consulted for assistance with disease management and care coordination needs.    Engaged with patient by telephone for follow up visit in response to provider referral for case management and care coordination services.   Consent to Services:  The patient was given information about Chronic Care Management services, agreed to services, and gave verbal consent prior to initiation of services.  Please see initial visit note for detailed documentation.    Assessment: Review of patient past medical history, allergies, medications, health status, including review of consultants reports, laboratory and other test data, was performed as part of comprehensive evaluation and provision of chronic care management services.   SDOH (Social Determinants of Health) assessments and interventions performed: No  CCM Care Plan  Allergies  Allergen Reactions   Sulfa Antibiotics Shortness Of Breath   Latex     Outpatient Encounter Medications as of 12/12/2021  Medication Sig Note   psyllium (METAMUCIL) 58.6 % powder Take 1 packet by mouth daily.    atorvastatin (LIPITOR) 10 MG tablet Take 1 tablet (10 mg total) by mouth daily.    ezetimibe (ZETIA) 10 MG tablet TAKE 1 TABLET(10 MG) BY MOUTH DAILY    fluticasone (FLONASE) 50 MCG/ACT nasal spray Place 2 sprays into both nostrils as needed. 03/26/2021: prn   loratadine (CLARITIN) 10 MG tablet Take 1 tablet (10 mg total) by mouth daily as needed for allergies. 03/26/2021: prn   metoprolol succinate (TOPROL-XL) 50 MG 24 hr tablet Take 1 tablet (50 mg total) by mouth daily.    Multiple Vitamin (MULTI-VITAMINS) TABS Take 1 tablet by mouth daily.    No facility-administered encounter medications on file as of  12/12/2021.    Patient Active Problem List   Diagnosis Date Noted   Anxiety 07/11/2021   Underweight 03/26/2021   Hip fracture due to osteoporosis with routine healing 03/26/2021   Closed left hip fracture ([place]) 07/03/2020   Hx of transient ischemic attack (TIA) 02/17/2019   Peripheral neuropathy 08/17/2018   Contracture of joint of finger of right hand 11/11/2017   Osteopenia 09/08/2017   Gallstone 12/16/2016   Calcification of abdominal aorta ([place]) 12/16/2016   Advance care planning 08/19/2016   Abdominal wall hernia 07/19/2015   Allergic rhinitis, seasonal 07/19/2015   Blood glucose elevated 07/19/2015   Hypertension goal BP (blood pressure) < 140/90 07/19/2015   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  RNCM Clinical Goal(s):  Patient will demonstrate ongoing adherence to prescribed treatment plan for HTN, HLD, and Fall Risk through collaboration with the provider, [place] and the care team.  Interventions: 1:1 collaboration with primary care provider regarding development and update of comprehensive plan of care as evidenced by provider attestation and co-signature Inter-disciplinary care team collaboration (see longitudinal plan of care) Evaluation of current treatment plan related to  self  management and patient's adherence to plan as established by provider   Falls Interventions:  (Goal Met) Reviewed safety and fall prevention measures. Discussed importance of utilizing a safety device and wearing non-skid footwear with all ambulation. Reports activity tolerance has improved. Reports  using her cane as advised. Reports engaging in low impact activity/walking frequently without difficulty. Reviewed ability to perform ADLs and IADLs. Continues to perform ADLs and task in the home independently. Reports her grandson remains in the home. Denies need for additional assistance. Advised to keep the care management team updated of functional changes or concerns.   Hyperlipidemia Interventions:   Lab Results  Component Value Date   CHOL 189 11/07/2021   HDL 62 11/07/2021   LDLCALC 112 (H) 11/07/2021   TRIG 68 11/07/2021   CHOLHDL 3.0 11/07/2021    Reviewed medications. Reports taking as prescribed. Reviewed established cholesterol goals. Discussed importance of regular laboratory monitoring as prescribed. Reviewed role and benefits of statin for ASCVD risk reduction. Discussed strategies to manage statin-induced myalgias. She has experienced several episodes of cramping and joint discomfort. Reports this has not impacted her ability to ambulate. She prefers to take statin at a different time and utilize some of the strategies discussed for the next two weeks and monitor for improvement. Will follow-up and notify her provider if she is still unable to tolerate. Reviewed importance of limiting foods high in cholesterol. Reviewed activity/exercise goals. Advised to continue engaging in low impact activity as tolerated.  Hypertension Interventions:   Last practice recorded BP readings:  BP Readings from Last 3 Encounters:  11/06/21 140/80  08/08/21 (!) 154/82  07/18/21 (!) 164/84  Most recent eGFR/CrCl:  Lab Results  Component Value Date   EGFR 89 11/07/2021    No components found for: CRCL  Reviewed plan for hypertension management. Advised to continue taking medications as prescribed.  Reviewed established blood pressure parameters along with indications for notifying a provider. Reports she is not currently monitoring her BP. Advised to monitor at least weekly if unable to  monitor daily and record readings.  Discussed compliance with recommended cardiac prudent diet. Reports doing very well with nutritional intake. Currently consumes mostly smooth diet d/t difficulty chewing. Encouraged to read nutrition labels and avoid highly processed foods when possible. Reviewed s/sx of heart attack, stroke and worsening symptoms that require immediate medical attention.   Patient Goals/Self-Care Activities: Take all medications as prescribed Attend all scheduled provider appointments Call pharmacy for medication refills 3-7 days in advance of running out of medications Perform all self care activities independently  Perform IADL's (shopping, preparing meals, housekeeping, managing finances) independently Call provider office for new concerns or questions   Follow Up Plan:   Will follow up in two weeks       PLAN: Will follow up in two weeks.   [facility] Care Management [facility] [phone]

## 2021-12-12 NOTE — Patient Instructions (Signed)
Thank you for allowing the Chronic Care Management team to participate in your care. It was great speaking with you today! °

## 2021-12-23 ENCOUNTER — Ambulatory Visit: Payer: PPO | Admitting: *Deleted

## 2021-12-23 ENCOUNTER — Telehealth: Payer: Self-pay | Admitting: *Deleted

## 2021-12-23 DIAGNOSIS — I10 Essential (primary) hypertension: Secondary | ICD-10-CM

## 2021-12-23 DIAGNOSIS — Z7189 Other specified counseling: Secondary | ICD-10-CM

## 2021-12-23 NOTE — Telephone Encounter (Signed)
°  Care Management   Follow Up Note   12/23/2021 Name: Laura Deleon MRN: [id number] DOB: 1945/12/10   Referred by: Danelle Berry, PA-C Reason for referral : Care Coordination   An unsuccessful telephone outreach was attempted today. The patient was referred to the case management team for assistance with care management and care coordination.   Follow Up Plan: Telephone follow up appointment with care management team member scheduled for: 12/30/21  Verna Czech, LCSW Clinical Social Worker  [facility] Care Management [phone]

## 2021-12-23 NOTE — Chronic Care Management (AMB) (Addendum)
Chronic Care Management    Clinical Social Work Note  12/23/2021 Name: Laura Deleon MRN: [id number] DOB: 06/22/1946  Laura Deleon is a 76 y.o. year old female who is a primary care patient of Laura Deleon, [place]. The CCM team was consulted to assist the patient with chronic disease management and/or care coordination needs related to: Mental Health Counseling and Resources.   Engaged with patient by telephone for follow up visit in response to provider referral for social work chronic care management and care coordination services.   Consent to Services:  The patient was given information about Chronic Care Management services, agreed to services, and gave verbal consent prior to initiation of services.  Please see initial visit note for detailed documentation.   Patient agreed to services and consent obtained.   Assessment: Review of patient past medical history, allergies, medications, and health status, including review of relevant consultants reports was performed today as part of a comprehensive evaluation and provision of chronic care management and care coordination services.     SDOH (Social Determinants of Health) assessments and interventions performed:    Advanced Directives Status: Not addressed in this encounter.  CCM Care Plan  Allergies  Allergen Reactions   Sulfa Antibiotics Shortness Of Breath   Latex     Outpatient Encounter Medications as of 12/23/2021  Medication Sig Note   atorvastatin (LIPITOR) 10 MG tablet Take 1 tablet (10 mg total) by mouth daily.    ezetimibe (ZETIA) 10 MG tablet TAKE 1 TABLET(10 MG) BY MOUTH DAILY    fluticasone (FLONASE) 50 MCG/ACT nasal spray Place 2 sprays into both nostrils as needed. 03/26/2021: prn   loratadine (CLARITIN) 10 MG tablet Take 1 tablet (10 mg total) by mouth daily as needed for allergies. 03/26/2021: prn   metoprolol succinate (TOPROL-XL) 50 MG 24 hr tablet Take 1 tablet (50 mg total) by mouth daily.     Multiple Vitamin (MULTI-VITAMINS) TABS Take 1 tablet by mouth daily.    psyllium (METAMUCIL) 58.6 % powder Take 1 packet by mouth daily.    No facility-administered encounter medications on file as of 12/23/2021.    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/2017   Osteopenia 09/08/2017   Gallstone 12/16/2016   Calcification of abdominal aorta ([facility]) 12/16/2016   Advance care planning 08/19/2016   Abdominal wall hernia 07/19/2015   Allergic rhinitis, seasonal 07/19/2015   Blood glucose elevated 07/19/2015   Hypertension goal BP (blood pressure) < 140/90 07/19/2015   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   Hyperlipidemia LDL goal <70 07/19/2015    Conditions to be addressed/monitored: Anxiety; Mental Health Concerns   Care Plan : Anxiety (Adult)  Updates made by Laura Overland, LCSW since 12/23/2021 12:00 AM     Problem: Symptoms (Anxiety)      Long-Range Goal: Anxiety Symptoms Monitored and Managed   Start Date: 07/26/2021  Recent Progress: On track  Priority: Medium  Note:   Current Barriers:  Chronic Mental Health needs related to current medical condition Mental Health Concerns  Suicidal Ideation/Homicidal Ideation: No  Clinical Social Work Goal(s):  Over the next 90 days, patient will work with SW bi-weekly by telephone or in person to  reduce or manage symptoms related to anxiety patient will work with SW to address concerns related to management of anxiety symptoms  Interventions:  Patient's challenges with anxiety related to feelings of isolation and family conflicts explored Patient continues to  recover from dental work done-pain continues- has dental appointment today  for follow up Family relationships continue to improve, anxiety symptoms improving evidenced by increased socialization and motivation to "do more". Per patient, she is feeling more energetic-specifically due to the change in weather Patient confirmed that positive self talk and her strong faith continues to beneficial Relationship grandson discussed, current discipline practices explored, reinforced need to develop boundaries and consisency Verbalization of feelings encouraged, self care , safety and increasing social activities prioritized including physical excercise Supportive Counseling/emotional support provided related to anxiety symptoms and maintaining self management strategies-positive reinforcement provided for continued plan to increase social activities and use of positive self talk  Active listening / Reflection provided Discussed plans with patient for ongoing care management follow up and provided patient with direct contact information for care management team  Patient Self Care Activities:  Performs ADL's independently Performs IADL's independently  Patient Coping Strengths:  Supportive Relationships Family Friends  Patient Self Care Deficits:  Lacks social connections  Please see past updates related to this goal by clicking on the "Past Updates" button in the selected goal        Follow Up Plan: SW will follow up with patient by phone over the next 14 business days      [employer], LCSW Clinical Social Worker  [facility] Care Management [phone]

## 2021-12-23 NOTE — Patient Instructions (Signed)
Visit Information  Thank you for taking time to visit with me today. Please don't hesitate to contact me if I can be of assistance to you before our next scheduled telephone appointment.  Following are the goals we discussed today:    Our next appointment is by telephone on 01/06/22 at 11am  Please call the care guide team at [phone] if you need to cancel or reschedule your appointment.   If you are experiencing a Mental Health or Behavioral Health Crisis or need someone to talk to, please call the Suicide and Crisis Lifeline: 988   Patient verbalizes understanding of instructions and care plan provided today and agrees to view in MyChart. Active MyChart status confirmed with patient.    Telephone follow up appointment with care management team member scheduled for: 01/06/22  Verna Czech, LCSW Clinical Social Worker  [facility] Care Management [phone]

## 2021-12-26 ENCOUNTER — Telehealth: Payer: PPO

## 2021-12-26 ENCOUNTER — Telehealth: Payer: Self-pay

## 2021-12-26 NOTE — Telephone Encounter (Signed)
°  Care Management   Follow Up Note   12/26/2021 Name: Laura Deleon MRN: [id number] DOB: [birth year]   Primary Care Provider: Delsa Grana, PA-C Reason for referral : Chronic Care Management   An unsuccessful telephone outreach was attempted today. The patient was referred to the case management team for assistance with care management and care coordination.   Follow Up Plan:  A HIPAA compliant voice message was left today requesting a return call.   [facility] [phone]

## 2022-01-06 ENCOUNTER — Ambulatory Visit: Payer: PPO | Admitting: *Deleted

## 2022-01-06 DIAGNOSIS — F419 Anxiety disorder, unspecified: Secondary | ICD-10-CM

## 2022-01-06 DIAGNOSIS — I10 Essential (primary) hypertension: Secondary | ICD-10-CM

## 2022-01-06 NOTE — Chronic Care Management (AMB) (Signed)
Chronic Care Management    Clinical Social Work Note  01/06/2022 Name: Laura Deleon MRN: [id number] DOB: 1946/02/20  Laura Deleon is a 76 y.o. year old female who is a primary care patient of Danelle Berry, [place]. The CCM team was consulted to assist the patient with chronic disease management and/or care coordination needs related to: Mental Health Counseling and Resources.   Engaged with patient by telephone for follow up visit in response to provider referral for social work chronic care management and care coordination services.   Consent to Services:  The patient was given information about Chronic Care Management services, agreed to services, and gave verbal consent prior to initiation of services.  Please see initial visit note for detailed documentation.   Patient agreed to services and consent obtained.   Assessment: Review of patient past medical history, allergies, medications, and health status, including review of relevant consultants reports was performed today as part of a comprehensive evaluation and provision of chronic care management and care coordination services.     SDOH (Social Determinants of Health) assessments and interventions performed:    Advanced Directives Status: Not addressed in this encounter.  CCM Care Plan  Allergies  Allergen Reactions   Sulfa Antibiotics Shortness Of Breath   Latex     Outpatient Encounter Medications as of 01/06/2022  Medication Sig Note   atorvastatin (LIPITOR) 10 MG tablet Take 1 tablet (10 mg total) by mouth daily.    ezetimibe (ZETIA) 10 MG tablet TAKE 1 TABLET(10 MG) BY MOUTH DAILY    fluticasone (FLONASE) 50 MCG/ACT nasal spray Place 2 sprays into both nostrils as needed. 03/26/2021: prn   loratadine (CLARITIN) 10 MG tablet Take 1 tablet (10 mg total) by mouth daily as needed for allergies. 03/26/2021: prn   metoprolol succinate (TOPROL-XL) 50 MG 24 hr tablet Take 1 tablet (50 mg total) by mouth daily.     Multiple Vitamin (MULTI-VITAMINS) TABS Take 1 tablet by mouth daily.    psyllium (METAMUCIL) 58.6 % powder Take 1 packet by mouth daily.    No facility-administered encounter medications on file as of 01/06/2022.    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/2017   Osteopenia 09/08/2017   Gallstone 12/16/2016   Calcification of abdominal aorta (HCC) 12/16/2016   Advance care planning 08/19/2016   Abdominal wall hernia 07/19/2015   Allergic rhinitis, seasonal 07/19/2015   Blood glucose elevated 07/19/2015   Hypertension goal BP (blood pressure) < 140/90 07/19/2015   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   Hyperlipidemia LDL goal <70 07/19/2015    Conditions to be addressed/monitored: Anxiety; Mental Health Concerns   Care Plan : Anxiety (Adult)  Updates made by Wenda Overland, LCSW since 01/06/2022 12:00 AM     Problem: Symptoms (Anxiety)      Long-Range Goal: Anxiety Symptoms Monitored and Managed   Start Date: 07/26/2021  This Visit's Progress: On track  Recent Progress: On track  Priority: Medium  Note:   Current Barriers:  Chronic Mental Health needs related to current medical condition Mental Health Concerns  Suicidal Ideation/Homicidal Ideation: No  Clinical Social Work Goal(s):  Over the next 90 days, patient will work with SW bi-weekly  by telephone or in person to reduce or manage symptoms related to anxiety patient will work with SW to address concerns related to management of anxiety symptoms  Interventions:   Patient's challenges with anxiety related to feelings of isolation and family conflicts explored- Patient continues to  recover from dental work done-pain  continues- had dental appointment couple a weeks ago for follow up-dentures adjusted still bothers me, instructed to take them out at night to rest, dentist to continue to adjust and will consider new ones if needed. Family relationships continue to improve, anxiety symptoms improving evidenced by increased socialization and motivation to "do more".  Enjoyed spending weekend with daughter which was enjoyable. Patient confirmed that positive self talk and her strong faith continues to beneficial Relationship grandson discussed, current discipline practices explored, reinforced need to develop boundaries and consistency,developmental milestones normalized,prioritizing maintaining safety-current plan is to call her son if behaviors become unmanageable, also calling 911 Verbalization of feelings encouraged, self care , safety and increasing social activities prioritized including physical exercise Supportive Counseling/emotional support provided related to anxiety symptoms and maintaining self management strategies-positive reinforcement provided for continued plan to increase social activities and use of positive self talk  Active listening / Reflection provided Discussed plans with patient for ongoing care management follow up and provided patient with direct contact information for care management team  Patient Self Care Activities:  Performs ADL's independently Performs IADL's independently  Patient Coping Strengths:  Supportive Relationships Family Friends  Patient Self Care Deficits:  Lacks social connections  Please see past updates related to this goal by clicking on the "Past Updates" button in the selected goal        Follow Up Plan: SW will follow up with patient by phone over the next 14-21 business days      [employer], [place] Clinical Social Worker  [facility] [phone]

## 2022-01-06 NOTE — Patient Instructions (Signed)
Visit Information  Thank you for taking time to visit with me today. Please don't hesitate to contact me if I can be of assistance to you before our next scheduled telephone appointment.  Following are the goals we discussed today:   - begin personal counseling-if needed - call and visit an old friend - check out volunteer opportunities - talk about feelings with a friend, family or spiritual advisor - continue daily bible studies  - practice positive thinking and self-talk   Our next appointment is by telephone on 01/20/22 at 11am  Please call the care guide team at [phone] if you need to cancel or reschedule your appointment.   If you are experiencing a Mental Health or Behavioral Health Crisis or need someone to talk to, please call the Suicide and Crisis Lifeline: 988   Patient verbalizes understanding of instructions and care plan provided today and agrees to view in MyChart. Active MyChart status confirmed with patient.    Telephone follow up appointment with care management team member scheduled for: 01/20/22  Verna Czech, LCSW Clinical Social Worker  [facility] [phone]

## 2022-01-07 DIAGNOSIS — E785 Hyperlipidemia, unspecified: Secondary | ICD-10-CM | POA: Diagnosis not present

## 2022-01-07 DIAGNOSIS — I10 Essential (primary) hypertension: Secondary | ICD-10-CM | POA: Diagnosis not present

## 2022-01-17 ENCOUNTER — Ambulatory Visit (INDEPENDENT_AMBULATORY_CARE_PROVIDER_SITE_OTHER): Payer: PPO

## 2022-01-17 DIAGNOSIS — E785 Hyperlipidemia, unspecified: Secondary | ICD-10-CM

## 2022-01-17 DIAGNOSIS — F419 Anxiety disorder, unspecified: Secondary | ICD-10-CM

## 2022-01-17 DIAGNOSIS — I10 Essential (primary) hypertension: Secondary | ICD-10-CM

## 2022-01-17 DIAGNOSIS — K068 Other specified disorders of gingiva and edentulous alveolar ridge: Secondary | ICD-10-CM

## 2022-01-17 DIAGNOSIS — Z9181 History of falling: Secondary | ICD-10-CM

## 2022-01-17 NOTE — Patient Instructions (Signed)
Thank you for allowing the Chronic Care Management team to participate in your care.  °

## 2022-01-17 NOTE — Chronic Care Management (AMB) (Unsigned)
°  Chronic Care Management   CCM RN Visit Note  01/17/2022 Name: Laura Deleon MRN: [id number] DOB: [birth year]  Subjective: Laura Deleon is a 76 y.o. year old female who is a primary care patient of Delsa Grana, [place]. The care management team was consulted for assistance with disease management and care coordination needs.    {CCMTELEPHONEFACETOFACE:21091510} for {CCMINITIALFOLLOWUPCHOICE:21091511} in response to provider referral for case management and/or care coordination services.   Consent to Services:  {CCMCONSENTOPTIONS:25074}  Patient agreed to services and verbal consent obtained.   Assessment: Review of patient past medical history, allergies, medications, health status, including review of consultants reports, laboratory and other test data, was performed as part of comprehensive evaluation and provision of chronic care management services.   SDOH (Social Determinants of Health) assessments and interventions performed:    CCM Care Plan  Allergies  Allergen Reactions   Sulfa Antibiotics Shortness Of Breath   Latex     Outpatient Encounter Medications as of 01/17/2022  Medication Sig Note   atorvastatin (LIPITOR) 10 MG tablet Take 1 tablet (10 mg total) by mouth daily.    ezetimibe (ZETIA) 10 MG tablet TAKE 1 TABLET(10 MG) BY MOUTH DAILY    fluticasone (FLONASE) 50 MCG/ACT nasal spray Place 2 sprays into both nostrils as needed. 03/26/2021: prn   loratadine (CLARITIN) 10 MG tablet Take 1 tablet (10 mg total) by mouth daily as needed for allergies. 03/26/2021: prn   metoprolol succinate (TOPROL-XL) 50 MG 24 hr tablet Take 1 tablet (50 mg total) by mouth daily.    Multiple Vitamin (MULTI-VITAMINS) TABS Take 1 tablet by mouth daily.    psyllium (METAMUCIL) 58.6 % powder Take 1 packet by mouth daily.    No facility-administered encounter medications on file as of 01/17/2022.    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   Plan:{CM FOLLOW UP PLAN:25073} SIG***

## 2022-01-31 ENCOUNTER — Ambulatory Visit: Payer: PPO | Admitting: *Deleted

## 2022-01-31 DIAGNOSIS — I10 Essential (primary) hypertension: Secondary | ICD-10-CM

## 2022-01-31 DIAGNOSIS — K068 Other specified disorders of gingiva and edentulous alveolar ridge: Secondary | ICD-10-CM

## 2022-01-31 DIAGNOSIS — F419 Anxiety disorder, unspecified: Secondary | ICD-10-CM

## 2022-01-31 NOTE — Patient Instructions (Signed)
Visit Information ? ?Thank you for taking time to visit with me today. Please don't hesitate to contact me if I can be of assistance to you before our next scheduled telephone appointment. ? ?Following are the goals we discussed today:  ? ?- begin personal counseling-if needed ?- call and visit an old friend ?- check out volunteer opportunities ?- talk about feelings with a friend, family or spiritual advisor ?- continue daily bible studies  ?- practice positive thinking and self-talk  ? ?Our next appointment is by telephone on 02/10/22 at 11am ? ?Please call the care guide team at [phone] if you need to cancel or reschedule your appointment.  ? ?If you are experiencing a Mental Health or Behavioral Health Crisis or need someone to talk to, please call the Suicide and Crisis Lifeline: 988  ? ?Patient verbalizes understanding of instructions and care plan provided today and agrees to view in MyChart. Active MyChart status confirmed with patient.   ? ?Telephone follow up appointment with care management team member scheduled for: 02/10/22 ? ?Keyosha Tiedt, LCSW ?Clinical Social Worker  ?[facility] ?[phone] ? ?

## 2022-01-31 NOTE — Chronic Care Management (AMB) (Signed)
?Chronic Care Management  ? ? Clinical Social Work Note ? ?01/31/2022 ?Name: Laura Deleon MRN: [id number] DOB: 1946-01-28 ? ?Laura Deleon is a 76 y.o. year old female who is a primary care patient of Delsa Grana, [place]. The CCM team was consulted to assist the patient with chronic disease management and/or care coordination needs related to: Mental Health Counseling and Resources.  ? ?Engaged with patient by telephone for follow up visit in response to provider referral for social work chronic care management and care coordination services.  ? ?Consent to Services:  ?The patient was given information about Chronic Care Management services, agreed to services, and gave verbal consent prior to initiation of services.  Please see initial visit note for detailed documentation.  ? ?Patient agreed to services and consent obtained.  ? ?Assessment: Review of patient past medical history, allergies, medications, and health status, including review of relevant consultants reports was performed today as part of a comprehensive evaluation and provision of chronic care management and care coordination services.    ? ?SDOH (Social Determinants of Health) assessments and interventions performed:   ? ?Advanced Directives Status: Not addressed in this encounter. ? ?CCM Care Plan ? ?Allergies  ?Allergen Reactions  ? Sulfa Antibiotics Shortness Of Breath  ? Latex   ? ? ?Outpatient Encounter Medications as of 01/31/2022  ?Medication Sig Note  ? atorvastatin (LIPITOR) 10 MG tablet Take 1 tablet (10 mg total) by mouth daily.   ? ezetimibe (ZETIA) 10 MG tablet TAKE 1 TABLET(10 MG) BY MOUTH DAILY   ? fluticasone (FLONASE) 50 MCG/ACT nasal spray Place 2 sprays into both nostrils as needed. 03/26/2021: prn  ? loratadine (CLARITIN) 10 MG tablet Take 1 tablet (10 mg total) by mouth daily as needed for allergies. 03/26/2021: prn  ? metoprolol succinate (TOPROL-XL) 50 MG 24 hr tablet Take 1 tablet (50 mg total) by mouth daily.   ?  Multiple Vitamin (MULTI-VITAMINS) TABS Take 1 tablet by mouth daily.   ? psyllium (METAMUCIL) 58.6 % powder Take 1 packet by mouth daily.   ? ?No facility-administered encounter medications on file as of 01/31/2022.  ? ? ?Patient Active Problem List  ? Diagnosis Date Noted  ? Anxiety 07/11/2021  ? Underweight 03/26/2021  ? Hip fracture due to osteoporosis with routine healing 03/26/2021  ? Closed left hip fracture ([place]) 07/03/2020  ? Hx of transient ischemic attack (TIA) 02/17/2019  ? Peripheral neuropathy 08/17/2018  ? 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 Osteopenia 09/08/2017  ? Gallstone 12/16/2016  ? Calcification of abdominal aorta (HCC) 12/16/2016  ? Advance care planning 08/19/2016  ? Abdominal wall hernia 07/19/2015  ? Allergic rhinitis, seasonal 07/19/2015  ? Blood glucose elevated 07/19/2015  ? Hypertension goal BP (blood pressure) < 140/90 07/19/2015  ? 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 Hyperlipidemia LDL goal <70 07/19/2015  ? ? ?Conditions to be addressed/monitored: Anxiety; Mental Health Concerns  ? ?Care Plan : Anxiety (Adult)  ?Updates made by Vern Claude, LCSW since 01/31/2022 12:00 AM  ?  ? ?Problem: Symptoms (Anxiety)   ?  ? ?Long-Range Goal: Anxiety Symptoms Monitored and Managed   ?Start Date: 07/26/2021  ?This Visit's Progress: On track  ?Recent Progress: On track  ?Priority: Medium  ?Note:   ?Current Barriers:  ?Chronic Mental Health needs related to current medical condition ?Mental Health Concerns  ?Suicidal Ideation/Homicidal Ideation: No ? ?Clinical Social Work Goal(s):  ?Over the next 90 days, patient will work with SW bi-weekly  by telephone or in person to reduce or manage symptoms related to anxiety ?patient will work with SW to address concerns related to management of anxiety symptoms ? ?Interventions:  doing better wit the sunshie, keeps busy ?Patient's challenges with anxiety related to feelings of isolation and family conflicts explored- ?Patient continues to   recover from dental work done-pain continues- continues to follow dentist for recommendations to manage pain ?Family relationships continue to improve, anxiety symptoms improving evidenced by increased socialization and motivation to "do more". Patient states that Spring and the ability to get out more has improved her mood ?Patient confirmed that positive self talk and her strong faith continues to beneficial ?Relationship grandson discussed, current discipline practices explored, continued to reinforce need to maintain  boundaries  ?Verbalization of feelings encouraged, self care , safety and increasing social activities prioritized including physical exercise ?Supportive Counseling/emotional support provided related to anxiety symptoms and maintaining self management strategies-positive reinforcement provided for continued plan to increase social activities and use of positive self talk  ?Active listening / Reflection provided ?Discussed plans with patient for ongoing care management follow up and provided patient with direct contact information for care management team ? ?Patient Self Care Activities:  ?Performs ADL's independently ?Performs IADL's independently ? ?Patient Coping Strengths:  ?Supportive Relationships ?Family ?Friends ? ?Patient Self Care Deficits:  ?Lacks social connections ? ?Please see past updates related to this goal by clicking on the "Past Updates" button in the selected goal  ? ?  ?  ? ?Follow Up Plan: SW will follow up with patient by phone over the next 14 business days ?     ?Citlali Gautney, LCSW ?Clinical Social Worker  ?[facility] ?[phone] ? ? ? ?

## 2022-02-07 DIAGNOSIS — I10 Essential (primary) hypertension: Secondary | ICD-10-CM | POA: Diagnosis not present

## 2022-02-07 DIAGNOSIS — E785 Hyperlipidemia, unspecified: Secondary | ICD-10-CM | POA: Diagnosis not present

## 2022-02-10 ENCOUNTER — Ambulatory Visit (INDEPENDENT_AMBULATORY_CARE_PROVIDER_SITE_OTHER): Payer: PPO | Admitting: *Deleted

## 2022-02-10 DIAGNOSIS — F419 Anxiety disorder, unspecified: Secondary | ICD-10-CM

## 2022-02-10 DIAGNOSIS — I10 Essential (primary) hypertension: Secondary | ICD-10-CM

## 2022-02-10 DIAGNOSIS — K068 Other specified disorders of gingiva and edentulous alveolar ridge: Secondary | ICD-10-CM

## 2022-02-10 NOTE — Chronic Care Management (AMB) (Signed)
?Chronic Care Management  ? ? Clinical Social Work Note ? ?02/10/2022 ?Name: Laura Deleon MRN: [id number] DOB: 1946/03/05 ? ?Laura Deleon is a 76 y.o. year old female who is a primary care patient of Laura Deleon, [place]. The CCM team was consulted to assist the patient with chronic disease management and/or care coordination needs related to: Mental Health Counseling and Resources.  ? ?Engaged with patient by telephone for follow up visit in response to provider referral for social work chronic care management and care coordination services.  ? ?Consent to Services:  ?The patient was given information about Chronic Care Management services, agreed to services, and gave verbal consent prior to initiation of services.  Please see initial visit note for detailed documentation.  ? ?Patient agreed to services and consent obtained.  ? ?Assessment: Review of patient past medical history, allergies, medications, and health status, including review of relevant consultants reports was performed today as part of a comprehensive evaluation and provision of chronic care management and care coordination services.    ? ?SDOH (Social Determinants of Health) assessments and interventions performed:   ? ?Advanced Directives Status: Not addressed in this encounter. ? ?CCM Care Plan ? ?Allergies  ?Allergen Reactions  ? Sulfa Antibiotics Shortness Of Breath  ? Latex   ? ? ?Outpatient Encounter Medications as of 02/10/2022  ?Medication Sig Note  ? atorvastatin (LIPITOR) 10 MG tablet Take 1 tablet (10 mg total) by mouth daily.   ? ezetimibe (ZETIA) 10 MG tablet TAKE 1 TABLET(10 MG) BY MOUTH DAILY   ? fluticasone (FLONASE) 50 MCG/ACT nasal spray Place 2 sprays into both nostrils as needed. 03/26/2021: prn  ? loratadine (CLARITIN) 10 MG tablet Take 1 tablet (10 mg total) by mouth daily as needed for allergies. 03/26/2021: prn  ? metoprolol succinate (TOPROL-XL) 50 MG 24 hr tablet Take 1 tablet (50 mg total) by mouth daily.   ? Multiple  Vitamin (MULTI-VITAMINS) TABS Take 1 tablet by mouth daily.   ? psyllium (METAMUCIL) 58.6 % powder Take 1 packet by mouth daily.   ? ?No facility-administered encounter medications on file as of 02/10/2022.  ? ? ?Patient Active Problem List  ? Diagnosis Date Noted  ? Anxiety 07/11/2021  ? Underweight 03/26/2021  ? Hip fracture due to osteoporosis with routine healing 03/26/2021  ? Closed left hip fracture (HCC) 07/03/2020  ? Hx of transient ischemic attack (TIA) 02/17/2019  ? Peripheral neuropathy 08/17/2018  ? 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 Osteopenia 09/08/2017  ? Gallstone 12/16/2016  ? Calcification of abdominal aorta (HCC) 12/16/2016  ? Advance care planning 08/19/2016  ? Abdominal wall hernia 07/19/2015  ? Allergic rhinitis, seasonal 07/19/2015  ? Blood glucose elevated 07/19/2015  ? Hypertension goal BP (blood pressure) < 140/90 07/19/2015  ? 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 Hyperlipidemia LDL goal <70 07/19/2015  ? ? ?Conditions to be addressed/monitored: Anxiety; Mental Health Concerns  ? ?Care Plan : Anxiety (Adult)  ?Updates made by Laura Overland, LCSW since 02/10/2022 12:00 AM  ?  ? ?Problem: Symptoms (Anxiety)   ?  ? ?Long-Range Goal: Anxiety Symptoms Monitored and Managed   ?Start Date: 07/26/2021  ?This Visit's Progress: On track  ?Recent Progress: On track  ?Priority: Medium  ?Note:   ?Current Barriers:  ?Chronic Mental Health needs related to current medical condition ?Mental Health Concerns  ?Suicidal Ideation/Homicidal Ideation: No ? ?Clinical Social Work Goal(s):  ?Over the next 90 days, patient will work with SW bi-weekly  by telephone or in person to reduce or manage symptoms related to anxiety ?patient will work with SW to address concerns related to management of anxiety symptoms ? ?Interventions:  doing better wit the sunshie, keeps busy ?Patient's challenges with anxiety related to feelings of isolation and family conflicts explored- ?Patient continues to  recover  from dental work done-pain continues- continues to follow dentist for recommendations to manage pain-uses poly grip 2x per day ?Family relationships continue to improve, anxiety symptoms improving evidenced by increased socialization and motivation to "do more". Patient states that Spring and the ability to get out more has improved her mood-spending more time with family beneficial ?Patient confirmed that positive self talk and her strong faith continues to help managing anxiety ?Relationship grandson discussed, current discipline practices explored, continued to reinforce need to maintain  boundaries  ?Verbalization of feelings encouraged, self care , safety and increasing social activities prioritized including physical exercise ?Supportive Counseling/emotional support provided related to anxiety symptoms and maintaining self management strategies-positive reinforcement provided for continued plan to increase social activities and use of positive self talk  ?Active listening / Reflection provided/emotional support provided ?Discussed plans with patient for ongoing care management follow up and provided patient with direct contact information for care management team ?Plan to follow up in 30 business days due to improvements made ?Patient Self Care Activities:  ?Performs ADL's independently ?Performs IADL's independently ? ?Patient Coping Strengths:  ?Supportive Relationships ?Family ?Friends ? ?Patient Self Care Deficits:  ?Lacks social connections ? ?Please see past updates related to this goal by clicking on the "Past Updates" button in the selected goal  ? ?  ?  ? ?Follow Up Plan: SW will follow up with patient by phone over the next 30 business days ?     ?Laura Yohn, LCSW ?Clinical Social Worker  ?[facility] ?[phone] ? ? ? ?

## 2022-02-10 NOTE — Patient Instructions (Addendum)
Visit Information ? ?Thank you for taking time to visit with me today. Please don't hesitate to contact me if I can be of assistance to you before our next scheduled telephone appointment. ? ?Following are the goals we discussed today:  ? ?- begin personal counseling-if needed ?- call and visit an old friend ?- check out volunteer opportunities ?- talk about feelings with a friend, family or spiritual advisor ?- continue daily bible studies  ?- practice positive thinking and self-talk  ?  ? ?Our next appointment is by telephone on 03/10/22 at 11am ? ?Please call the care guide team at [phone] if you need to cancel or reschedule your appointment.  ? ?If you are experiencing a Mental Health or Behavioral Health Crisis or need someone to talk to, please call the Suicide and Crisis Lifeline: 988  ? ?Patient verbalizes understanding of instructions and care plan provided today and agrees to view in MyChart. Active MyChart status confirmed with patient.   ? ?Telephone follow up appointment with care management team member scheduled for: 03/10/22 ? ?Mazie Fencl, LCSW ?Clinical Social Worker  ?[facility] ?[phone] ? ?

## 2022-03-10 ENCOUNTER — Ambulatory Visit (INDEPENDENT_AMBULATORY_CARE_PROVIDER_SITE_OTHER): Payer: PPO | Admitting: *Deleted

## 2022-03-10 DIAGNOSIS — I10 Essential (primary) hypertension: Secondary | ICD-10-CM

## 2022-03-10 DIAGNOSIS — F419 Anxiety disorder, unspecified: Secondary | ICD-10-CM

## 2022-03-10 NOTE — Patient Instructions (Signed)
Visit Information ? ?Thank you for taking time to visit with me today. Please don't hesitate to contact me if I can be of assistance to you before our next scheduled telephone appointment. ? ?Following are the goals we discussed today:  ? ?- begin personal counseling-if needed ?- call and visit an old friend ?- check out volunteer opportunities ?- talk about feelings with a friend, family or spiritual advisor ?- continue daily bible studies  ?- practice positive thinking and self-talk  ?  ? ?Our next appointment is by telephone on 04/07/22 at 11am ? ?Please call the care guide team at [phone] if you need to cancel or reschedule your appointment.  ? ?If you are experiencing a Mental Health or Behavioral Health Crisis or need someone to talk to, please call the Suicide and Crisis Lifeline: 988  ? ?Patient verbalizes understanding of instructions and care plan provided today and agrees to view in MyChart. Active MyChart status confirmed with patient.   ? ?Telephone follow up appointment with care management team member scheduled for: 04/07/22 ? ?Delonda Coley, LCSW ?Clinical Social Worker  ?[facility] ?[phone] ? ?

## 2022-03-10 NOTE — Chronic Care Management (AMB) (Signed)
?Chronic Care Management  ? ? Clinical Social Work Note ? ?03/10/2022 ?Name: Joley Utecht MRN: [id number] DOB: 04-13-1946 ? ?Fontaine Kossman Mogle is a 76 y.o. year old female who is a primary care patient of Danelle Berry, [place]. The CCM team was consulted to assist the patient with chronic disease management and/or care coordination needs related to: Mental Health Counseling and Resources.  ? ?Engaged with patient by telephone for follow up visit in response to provider referral for social work chronic care management and care coordination services.  ? ?Consent to Services:  ?The patient was given information about Chronic Care Management services, agreed to services, and gave verbal consent prior to initiation of services.  Please see initial visit note for detailed documentation.  ? ?Patient agreed to services and consent obtained.  ? ?Assessment: Review of patient past medical history, allergies, medications, and health status, including review of relevant consultants reports was performed today as part of a comprehensive evaluation and provision of chronic care management and care coordination services.    ? ?SDOH (Social Determinants of Health) assessments and interventions performed:   ? ?Advanced Directives Status: Not addressed in this encounter. ? ?CCM Care Plan ? ?Allergies  ?Allergen Reactions  ? Sulfa Antibiotics Shortness Of Breath  ? Latex   ? ? ?Outpatient Encounter Medications as of 03/10/2022  ?Medication Sig Note  ? atorvastatin (LIPITOR) 10 MG tablet Take 1 tablet (10 mg total) by mouth daily.   ? ezetimibe (ZETIA) 10 MG tablet TAKE 1 TABLET(10 MG) BY MOUTH DAILY   ? fluticasone (FLONASE) 50 MCG/ACT nasal spray Place 2 sprays into both nostrils as needed. 03/26/2021: prn  ? loratadine (CLARITIN) 10 MG tablet Take 1 tablet (10 mg total) by mouth daily as needed for allergies. 03/26/2021: prn  ? metoprolol succinate (TOPROL-XL) 50 MG 24 hr tablet Take 1 tablet (50 mg total) by mouth daily.   ? Multiple  Vitamin (MULTI-VITAMINS) TABS Take 1 tablet by mouth daily.   ? psyllium (METAMUCIL) 58.6 % powder Take 1 packet by mouth daily.   ? ?No facility-administered encounter medications on file as of 03/10/2022.  ? ? ?Patient Active Problem List  ? Diagnosis Date Noted  ? Anxiety 07/11/2021  ? Underweight 03/26/2021  ? Hip fracture due to osteoporosis with routine healing 03/26/2021  ? Closed left hip fracture (HCC) 07/03/2020  ? Hx of transient ischemic attack (TIA) 02/17/2019  ? Peripheral neuropathy 08/17/2018  ? 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 Osteopenia 09/08/2017  ? Gallstone 12/16/2016  ? Calcification of abdominal aorta (HCC) 12/16/2016  ? Advance care planning 08/19/2016  ? Abdominal wall hernia 07/19/2015  ? Allergic rhinitis, seasonal 07/19/2015  ? Blood glucose elevated 07/19/2015  ? Hypertension goal BP (blood pressure) < 140/90 07/19/2015  ? 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 Hyperlipidemia LDL goal <70 07/19/2015  ? ? ?Conditions to be addressed/monitored: Anxiety; Mental Health Concerns  ? ?Care Plan : Anxiety (Adult)  ?Updates made by Wenda Overland, LCSW since 03/10/2022 12:00 AM  ?  ? ?Problem: Symptoms (Anxiety)   ?  ? ?Long-Range Goal: Anxiety Symptoms Monitored and Managed   ?Start Date: 07/26/2021  ?This Visit's Progress: On track  ?Recent Progress: On track  ?Priority: Medium  ?Note:   ?Current Barriers:  ?Chronic Mental Health needs related to current medical condition ?Mental Health Concerns  ?Suicidal Ideation/Homicidal Ideation: No ? ?Clinical Social Work Goal(s):  ?Over the next 90 days, patient will work with SW bi-weekly  by telephone or in person to reduce or manage symptoms related to anxiety ?patient will work with SW to address concerns related to management of anxiety symptoms ? ?Interventions:   ?Patient's challenges with anxiety  explored-anxiety improving with increased socialization with family and friends ?Patient continues to  recover from dental work  done-pain continues- continues to follow dentist for recommendations to manage pain-uses poly grip 2x per day ?Family relationships continue to improve, anxiety symptoms improving evidenced by increased interaction with family-confirmed episodes of grief related to spouse ?Patient confirmed that positive self talk, her strong faith and relationship with family continues to help managing anxiety ?Relationship grandson discussed, current discipline practices explored, continued to reinforce need to maintain  boundaries -discussed establishing behavior chart and positive reinforcement plan-grandson requesting allowance for completed tasks ?Verbalization of feelings encouraged, self care , safety and increasing social activities prioritized including physical exercise ?Supportive Counseling/emotional support provided related to anxiety symptoms and maintaining self management strategies-positive reinforcement provided for continued plan to increase social activities and use of positive self talk  ?Active listening / Reflection provided/emotional support provided ?Discussed plans with patient for ongoing care management follow up and provided patient with direct contact information for care management team ?Plan to follow up in 30 business days due to improvements made ?Patient Self Care Activities:  ?Performs ADL's independently ?Performs IADL's independently ? ?Patient Coping Strengths:  ?Supportive Relationships ?Family ?Friends ? ?Patient Self Care Deficits:  ?Lacks social connections ? ?Please see past updates related to this goal by clicking on the "Past Updates" button in the selected goal  ? ?  ?  ? ?Follow Up Plan: SW will follow up with patient by phone over the next 30 business days ?     ?Ceil Roderick, LCSW ?Clinical Social Worker  ?[facility] ?[phone] ? ? ? ?

## 2022-04-09 DIAGNOSIS — F419 Anxiety disorder, unspecified: Secondary | ICD-10-CM

## 2022-04-11 ENCOUNTER — Ambulatory Visit (INDEPENDENT_AMBULATORY_CARE_PROVIDER_SITE_OTHER): Payer: PPO | Admitting: *Deleted

## 2022-04-11 DIAGNOSIS — F419 Anxiety disorder, unspecified: Secondary | ICD-10-CM

## 2022-04-11 DIAGNOSIS — I10 Essential (primary) hypertension: Secondary | ICD-10-CM

## 2022-04-11 NOTE — Chronic Care Management (AMB) (Signed)
Chronic Care Management    Clinical Social Work Note  04/11/2022 Name: Laura Deleon MRN: [id number] DOB: [birth year]  Laura Deleon is a 76 y.o. year old female who is a primary care patient of Delsa Grana, [place]. The CCM team was consulted to assist the patient with chronic disease management and/or care coordination needs related to: Mental Health Counseling and Resources.   Engaged with patient by telephone for follow up visit in response to provider referral for social work chronic care management and care coordination services.   Consent to Services:  The patient was given information about Chronic Care Management services, agreed to services, and gave verbal consent prior to initiation of services.  Please see initial visit note for detailed documentation.   Patient agreed to services and consent obtained.   Assessment: Review of patient past medical history, allergies, medications, and health status, including review of relevant consultants reports was performed today as part of a comprehensive evaluation and provision of chronic care management and care coordination services.     SDOH (Social Determinants of Health) assessments and interventions performed:    Advanced Directives Status: Not addressed in this encounter.  CCM Care Plan  Allergies  Allergen Reactions   Sulfa Antibiotics Shortness Of Breath   Latex     Outpatient Encounter Medications as of 04/11/2022  Medication Sig Note   atorvastatin (LIPITOR) 10 MG tablet Take 1 tablet (10 mg total) by mouth daily.    ezetimibe (ZETIA) 10 MG tablet TAKE 1 TABLET(10 MG) BY MOUTH DAILY    fluticasone (FLONASE) 50 MCG/ACT nasal spray Place 2 sprays into both nostrils as needed. 03/26/2021: prn   loratadine (CLARITIN) 10 MG tablet Take 1 tablet (10 mg total) by mouth daily as needed for allergies. 03/26/2021: prn   metoprolol succinate (TOPROL-XL) 50 MG 24 hr tablet Take 1 tablet (50 mg total) by mouth daily.    Multiple  Vitamin (MULTI-VITAMINS) TABS Take 1 tablet by mouth daily.    psyllium (METAMUCIL) 58.6 % powder Take 1 packet by mouth daily.    No facility-administered encounter medications on file as of 04/11/2022.    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[place]) 12/16/2016   Advance care planning 08/19/2016   Abdominal wall hernia 07/19/2015   Allergic rhinitis, seasonal 07/19/2015   Blood glucose elevated 07/19/2015   Hypertension goal BP (blood pressure) < 140/90 07/19/2015   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   Hyperlipidemia LDL goal <70 07/19/2015    Conditions to be addressed/monitored: Anxiety; Mental Health Concerns   Care Plan : Anxiety (Adult)  Updates made by Vern Claude, LCSW since 04/11/2022 12:00 AM     Problem: Symptoms (Anxiety)      Long-Range Goal: Anxiety Symptoms Monitored and Managed   Start Date: 07/26/2021  Recent Progress: On track  Priority: Medium  Note:   Current Barriers:  Chronic Mental Health needs related to current medical condition Mental Health Concerns  Suicidal Ideation/Homicidal Ideation: No  Clinical Social Work Goal(s):  Over the next 90 days, patient will work with SW bi-weekly by telephone or in person to  reduce or manage symptoms related to anxiety patient will work with SW to address concerns related to management of anxiety symptoms  Interventions:   Patient's challenges with anxiety  explored-anxiety improving with increased socialization with family and friends-worsens with "bad" weather Patient discussed that isolation exacerbates depressive  symptoms Patient continues to  recover from dental work done-pain continues- dentures adjusted by dentist which provided temporary relief-will get permanent dentures in the next few weeks Family relationships continue to improve, anxiety symptoms improving evidenced by increased interaction with family Patient confirmed that positive self talk, her strong faith, regular exercise continues to help managing anxiety Relationship grandson discussed, current discipline practices explored, continued to reinforce need to maintain  boundaries -anticipated summer activities discussed-contact information provided for [employer] program-also suggested going through the school system Verbalization of feelings encouraged, self care , safety and increasing social activities prioritized including physical exercise Supportive Counseling/emotional support provided related to anxiety symptoms and maintaining self management strategies-positive reinforcement provided for continued plan to increase social activities and use of positive self talk  Active listening / Reflection provided/emotional support provided Discussed plans with patient for ongoing care management follow up and provided patient with direct contact information for care management team Plan to follow up in 14 business days Patient Self Care Activities:  Performs ADL's independently Performs IADL's independently  Patient Coping Strengths:  Supportive Relationships Family Friends  Patient Self Care Deficits:  Lacks social connections  Please see past updates related to this goal by clicking on the "Past Updates" button in the selected goal        Follow Up Plan: SW will follow up with patient by phone over the next 14 business days      [employer], [place] Worker  [facility] [phone]

## 2022-04-11 NOTE — Patient Instructions (Addendum)
Visit Information  Thank you for taking time to visit with me today. Please don't hesitate to contact me if I can be of assistance to you before our next scheduled telephone appointment.  Following are the goals we discussed today:   - begin personal counseling-if needed - call and visit an old friend - check out volunteer opportunities - talk about feelings with a friend, family or spiritual advisor - continue daily bible studies  - practice positive thinking and self-talk  -contact [employer] as possible option for grandson  Our next appointment is by telephone on 04/25/22 at 11am  Please call the care guide team at [phone] if you need to cancel or reschedule your appointment.   If you are experiencing a Mental Health or [place] or need someone to talk to, please call the Suicide and Crisis Lifeline: 988   Patient verbalizes understanding of instructions and care plan provided today and agrees to view in [place]. Active MyChart status and patient understanding of how to access instructions and care plan via MyChart confirmed with patient.     Telephone follow up appointment with care management team member scheduled for:  Elliot Gurney, [place] Worker  [facility] [phone]

## 2022-04-25 ENCOUNTER — Ambulatory Visit: Payer: PPO | Admitting: *Deleted

## 2022-04-25 DIAGNOSIS — F419 Anxiety disorder, unspecified: Secondary | ICD-10-CM

## 2022-04-25 DIAGNOSIS — I10 Essential (primary) hypertension: Secondary | ICD-10-CM

## 2022-04-25 NOTE — Chronic Care Management (AMB) (Signed)
Chronic Care Management    Clinical Social Work Note  04/25/2022 Name: Laura Deleon MRN: [id number] DOB: 04-Sep-1946  Laura Deleon is a 76 y.o. year old female who is a primary care patient of Danelle Berry, [place]. The CCM team was consulted to assist the patient with chronic disease management and/or care coordination needs related to: Mental Health Counseling and Resources.   Engaged with patient by telephone for follow up visit in response to provider referral for social work chronic care management and care coordination services.   Consent to Services:  The patient was given information about Chronic Care Management services, agreed to services, and gave verbal consent prior to initiation of services.  Please see initial visit note for detailed documentation.   Patient agreed to services and consent obtained.   Assessment: Review of patient past medical history, allergies, medications, and health status, including review of relevant consultants reports was performed today as part of a comprehensive evaluation and provision of chronic care management and care coordination services.     SDOH (Social Determinants of Health) assessments and interventions performed:    Advanced Directives Status: Not addressed in this encounter.  CCM Care Plan  Allergies  Allergen Reactions   Sulfa Antibiotics Shortness Of Breath   Latex     Outpatient Encounter Medications as of 04/25/2022  Medication Sig Note   atorvastatin (LIPITOR) 10 MG tablet Take 1 tablet (10 mg total) by mouth daily.    ezetimibe (ZETIA) 10 MG tablet TAKE 1 TABLET(10 MG) BY MOUTH DAILY    fluticasone (FLONASE) 50 MCG/ACT nasal spray Place 2 sprays into both nostrils as needed. 03/26/2021: prn   loratadine (CLARITIN) 10 MG tablet Take 1 tablet (10 mg total) by mouth daily as needed for allergies. 03/26/2021: prn   metoprolol succinate (TOPROL-XL) 50 MG 24 hr tablet Take 1 tablet (50 mg total) by mouth daily.     Multiple Vitamin (MULTI-VITAMINS) TABS Take 1 tablet by mouth daily.    psyllium (METAMUCIL) 58.6 % powder Take 1 packet by mouth daily.    No facility-administered encounter medications on file as of 04/25/2022.    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/2017   Osteopenia 09/08/2017   Gallstone 12/16/2016   Calcification of abdominal aorta (HCC) 12/16/2016   Advance care planning 08/19/2016   Abdominal wall hernia 07/19/2015   Allergic rhinitis, seasonal 07/19/2015   Blood glucose elevated 07/19/2015   Hypertension goal BP (blood pressure) < 140/90 07/19/2015   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   Hyperlipidemia LDL goal <70 07/19/2015    Conditions to be addressed/monitored: Anxiety; Mental Health Concerns   Care Plan : Anxiety (Adult)  Updates made by Wenda Overland, LCSW since 04/25/2022 12:00 AM     Problem: Symptoms (Anxiety)      Long-Range Goal: Anxiety Symptoms Monitored and Managed   Start Date: 07/26/2021  Recent Progress: On track  Priority: Medium  Note:   Current Barriers:  Chronic Mental Health needs related to current medical condition Mental Health Concerns  Suicidal Ideation/Homicidal Ideation: No  Clinical Social Work Goal(s):  Over the next 90 days, patient will work with SW bi-weekly by telephone or in person to  reduce or manage symptoms related to anxiety patient will work with SW to address concerns related to management of anxiety symptoms  Interventions:   Patient's challenges with anxiety  explored-anxiety improving with continued family involvement and increasing outdoor activities Patient discussed that isolation exacerbates depressive  symptoms and has been making more of an effort to remain active Patient continues to  recover from dental work done-pain continues- dentures continue to be adjusted by dentist which provides temporary relief-will get permanent dentures 06/24/22 Family relationships continue to improve, anxiety symptoms improving evidenced by increased interaction with family Patient continues to confirm that positive self talk, her strong faith, regular exercise continues to help managing anxiety Relationship grandson discussed, current discipline practices explored, continued to reinforce need to maintain  boundaries -anticipated summer activities discussed-contact information provided for [employer] program-also suggested going through the school system Verbalization of feelings encouraged, self care , safety and increasing social activities prioritized including physical exercise Supportive Counseling/emotional support provided related to anxiety symptoms and maintaining self management strategies-positive reinforcement provided for continued plan to increase social activities and use of positive self talk  Active listening / Reflection provided/emotional support provided Patient to be closed at this time due to improvements made and effective coping strategies utilized Patient Self Care Activities:  Performs ADL's independently Performs IADL's independently  Patient Coping Strengths:  Supportive Relationships Family Friends  Patient Self Care Deficits:  Lacks social connections  Please see past updates related to this goal by clicking on the "Past Updates" button in the selected goal        Follow Up Plan:  No further follow up required due to achieved goals.      Verna Czech, LCSW [facility] [phone]

## 2022-04-25 NOTE — Patient Instructions (Signed)
Visit Information  Thank you for taking time to visit with me today. Please don't hesitate to contact me if I can be of assistance to you before our next scheduled telephone appointment.  Following are the goals we discussed today:   - begin personal counseling-if needed - call and visit an old friend - check out volunteer opportunities - talk about feelings with a friend, family or spiritual advisor - continue daily bible studies  - practice positive thinking and self-talk    If you are experiencing a Mental Health or Behavioral Health Crisis or need someone to talk to, please call the Suicide and Crisis Lifeline: 988   Patient verbalizes understanding of instructions and care plan provided today and agrees to view in MyChart. Active MyChart status and patient understanding of how to access instructions and care plan via MyChart confirmed with patient.     No further follow up required:    Verna Czech, LCSW [facility] [phone]

## 2022-04-29 ENCOUNTER — Other Ambulatory Visit: Payer: Self-pay | Admitting: Nurse Practitioner

## 2022-04-29 DIAGNOSIS — E785 Hyperlipidemia, unspecified: Secondary | ICD-10-CM

## 2022-04-29 MED ORDER — EZETIMIBE 10 MG PO TABS: ORAL_TABLET | ORAL | 3 refills | Status: DC

## 2022-04-29 NOTE — Telephone Encounter (Signed)
Called pt and LM on VM to call back to office to schedule appt. Per last OV pt was to f/I 05/07/22. Pt had appt and canceled it. Last RF 11/06/21 #90 1 RF Refill due, is an active medication.   Requested Prescriptions  Pending Prescriptions Disp Refills   ezetimibe (ZETIA) 10 MG tablet [Pharmacy Med Name: EZETIMIBE 10MG  TABLETS] 90 tablet 1    Sig: TAKE 1 TABLET(10 MG) BY MOUTH DAILY     Cardiovascular:  Antilipid - Sterol Transport Inhibitors Failed - 04/29/2022  9:03 AM      Failed - Lipid Panel in normal range within the last 12 months    Cholesterol, Total  Date Value Ref Range Status  07/20/2015 245 (H) 100 - 199 mg/dL Final   Cholesterol  Date Value Ref Range Status  11/07/2021 189 <200 mg/dL Final   LDL Cholesterol (Calc)  Date Value Ref Range Status  11/07/2021 112 (H) mg/dL (calc) Final    Comment:    Reference range: <100 . Desirable range <100 mg/dL for primary prevention;   <70 mg/dL for patients with CHD or diabetic patients  with > or = 2 CHD risk factors. 11/09/2021 LDL-C is now calculated using the Martin-Hopkins  calculation, which is a validated novel method providing  better accuracy than the Friedewald equation in the  estimation of LDL-C.  Marland Kitchen et al. Horald Pollen. Lenox Ahr): 2061-2068  (http://education.[facility].com/faq/FAQ164)    HDL  Date Value Ref Range Status  11/07/2021 62 > OR = 50 mg/dL Final  11/09/2021 49 83/38/2505 mg/dL Final    Comment:    According to ATP-III Guidelines, HDL-C >59 mg/dL is considered a negative risk factor for CHD.    Triglycerides  Date Value Ref Range Status  11/07/2021 68 <150 mg/dL Final         Passed - AST in normal range and within 360 days    AST  Date Value Ref Range Status  11/07/2021 21 10 - 35 U/L Final         Passed - ALT in normal range and within 360 days    ALT  Date Value Ref Range Status  11/07/2021 15 6 - 29 U/L Final         Passed - Patient is not pregnant      Passed - Valid encounter  within last 12 months    Recent Outpatient Visits           5 months ago Hyperlipidemia LDL goal <70   [facility], FNP   8 months ago Need for influenza vaccination   [facility], NP   9 months ago Routine general medical examination at a health care facility   [facility], NP   1 year ago Osteoporosis with current pathological fracture with routine healing, unspecified osteoporosis type, subsequent encounter   [facility], NP   1 year ago Essential hypertension   [facility], PA-C       Future Appointments             In 1 month [facility], [facility]

## 2022-04-29 NOTE — Telephone Encounter (Signed)
Pt is calling back to make an appointment; however, she is wanting to know if PCP will be drawing blood, and if so, she would like an early morning appointment.     While checking on this pt disconnected call.     Please advise.

## 2022-04-30 ENCOUNTER — Other Ambulatory Visit: Payer: Self-pay | Admitting: Nurse Practitioner

## 2022-04-30 DIAGNOSIS — I10 Essential (primary) hypertension: Secondary | ICD-10-CM

## 2022-04-30 NOTE — Telephone Encounter (Signed)
Spoke with pt and informed her that her prescription has been sent to the pharmacy and for her to keep her for tomorrow. Also informed her that no lab work will be done. Pt verbalized understanding.

## 2022-04-30 NOTE — Telephone Encounter (Signed)
Requested Prescriptions  Pending Prescriptions Disp Refills  . metoprolol succinate (TOPROL-XL) 50 MG 24 hr tablet [Pharmacy Med Name: METOPROLOL ER SUCCINATE 50MG  TABS] 90 tablet 0    Sig: TAKE 1 TABLET(50 MG) BY MOUTH DAILY     Cardiovascular:  Beta Blockers Failed - 04/30/2022  7:20 AM      Failed - Last BP in normal range    BP Readings from Last 1 Encounters:  11/06/21 140/80         Passed - Last Heart Rate in normal range    Pulse Readings from Last 1 Encounters:  11/06/21 83         Passed - Valid encounter within last 6 months    Recent Outpatient Visits          5 months ago Hyperlipidemia LDL goal <70   [facility], FNP   8 months ago Need for influenza vaccination   [facility], NP   9 months ago Routine general medical examination at a health care facility   [facility], NP   1 year ago Osteoporosis with current pathological fracture with routine healing, unspecified osteoporosis type, subsequent encounter   [facility], NP   1 year ago Essential hypertension   [facility], Danelle Berry      Future Appointments            Tomorrow [place], PA-C [facility], [facility]   In 1 month  [facility], [facility]

## 2022-05-01 ENCOUNTER — Encounter: Payer: Self-pay | Admitting: Family Medicine

## 2022-05-01 ENCOUNTER — Ambulatory Visit (INDEPENDENT_AMBULATORY_CARE_PROVIDER_SITE_OTHER): Payer: PPO | Admitting: Family Medicine

## 2022-05-01 VITALS — BP 174/84 | HR 76 | Temp 98.3°F | Resp 16 | Ht 66.0 in | Wt 112.1 lb

## 2022-05-01 DIAGNOSIS — F419 Anxiety disorder, unspecified: Secondary | ICD-10-CM | POA: Diagnosis not present

## 2022-05-01 DIAGNOSIS — I10 Essential (primary) hypertension: Secondary | ICD-10-CM | POA: Diagnosis not present

## 2022-05-01 DIAGNOSIS — E785 Hyperlipidemia, unspecified: Secondary | ICD-10-CM | POA: Diagnosis not present

## 2022-05-01 DIAGNOSIS — Z780 Asymptomatic menopausal state: Secondary | ICD-10-CM | POA: Diagnosis not present

## 2022-05-01 DIAGNOSIS — Z1211 Encounter for screening for malignant neoplasm of colon: Secondary | ICD-10-CM

## 2022-05-01 DIAGNOSIS — R002 Palpitations: Secondary | ICD-10-CM

## 2022-05-01 DIAGNOSIS — Z1231 Encounter for screening mammogram for malignant neoplasm of breast: Secondary | ICD-10-CM | POA: Diagnosis not present

## 2022-05-01 DIAGNOSIS — R636 Underweight: Secondary | ICD-10-CM

## 2022-05-01 DIAGNOSIS — Z79899 Other long term (current) drug therapy: Secondary | ICD-10-CM

## 2022-05-01 DIAGNOSIS — Z23 Encounter for immunization: Secondary | ICD-10-CM | POA: Diagnosis not present

## 2022-05-01 DIAGNOSIS — M80059D Age-related osteoporosis with current pathological fracture, unspecified femur, subsequent encounter for fracture with routine healing: Secondary | ICD-10-CM | POA: Diagnosis not present

## 2022-05-01 DIAGNOSIS — I700 Atherosclerosis of aorta: Secondary | ICD-10-CM | POA: Diagnosis not present

## 2022-05-01 DIAGNOSIS — R634 Abnormal weight loss: Secondary | ICD-10-CM | POA: Diagnosis not present

## 2022-05-01 MED ORDER — SHINGRIX 50 MCG/0.5ML IM SUSR: 0.5000 mL | Freq: Once | INTRAMUSCULAR | 0 refills | Status: AC

## 2022-05-01 MED ORDER — MIRTAZAPINE 15 MG PO TABS: 15.0000 mg | ORAL_TABLET | Freq: Every day | ORAL | 1 refills | Status: DC

## 2022-05-01 NOTE — Progress Notes (Signed)
Name: Laura Deleon   MRN: [id number]    DOB: 1946/11/09   Date:05/01/2022       Progress Note  Chief Complaint  Patient presents with   Follow-up   Hyperlipidemia   Hypertension    BP pill taken before appointment     Subjective:   Laura Deleon is a 76 y.o. female, presents to clinic for routine f/up  HTN - BP high only on metoprolol BP Readings from Last 10 Encounters:  05/01/22 (!) 174/84  11/06/21 140/80  08/08/21 (!) 154/82  07/18/21 (!) 164/84  07/11/21 (!) 184/94  05/23/21 (!) 144/84  03/26/21 138/84  09/10/20 (!) 167/84  08/16/20 118/74  08/08/20 (!) 169/86   Pulse Readings from Last 3 Encounters:  05/01/22 76  11/06/21 83  08/08/21 73     HLD on low intensity statin works on healthy diet  Hip fx and surgery after fall last year  Dental extractions and dentures cannot chew foods normally, having weight loss, drinks one ensure a day, blends up her foods, does feel hungry and is worried about weight and muscle loss Wt Readings from Last 5 Encounters:  05/01/22 112 lb 1.6 oz (50.8 kg)  11/06/21 109 lb 6.4 oz (49.6 kg)  08/08/21 110 lb 8 oz (50.1 kg)  07/11/21 111 lb 8 oz (50.6 kg)  05/23/21 113 lb 6.4 oz (51.4 kg)   BMI Readings from Last 5 Encounters:  05/01/22 18.09 kg/m  11/06/21 17.66 kg/m  08/08/21 17.84 kg/m  07/11/21 18.00 kg/m  05/23/21 18.30 kg/m     Fredricka Bonine grandson lives with pt 47 y/o  She is anxious in general - tends to worry about everything, poor sleep - severe generalized worry since her husband died, not on meds, poor sleep    05/01/2022    9:35 AM 11/06/2021   10:51 AM 08/08/2021   11:14 AM  Depression screen PHQ 2/9  Decreased Interest 0 0 0  Down, Depressed, Hopeless 0 0 1  PHQ - 2 Score 0 0 1  Altered sleeping 0    Tired, decreased energy 0    Change in appetite 0    Feeling bad or failure about yourself  0    Trouble concentrating 0    Moving slowly or fidgety/restless 0    Suicidal thoughts 0     PHQ-9 Score 0    Difficult doing work/chores Not difficult at all        05/01/2022    9:46 AM  GAD 7 : Generalized Anxiety Score  Nervous, Anxious, on Edge 0  Control/stop worrying 0  Worry too much - different things 0  Trouble relaxing 0  Restless 0  Easily annoyed or irritable 0  Afraid - awful might happen 0  Total GAD 7 Score 0  Anxiety Difficulty Not difficult at all   GAD 7 does not reflect all the worries and anxiety that she expresses in appt today      Current Outpatient Medications:    atorvastatin (LIPITOR) 10 MG tablet, Take 1 tablet (10 mg total) by mouth daily., Disp: 90 tablet, Rfl: 3   ezetimibe (ZETIA) 10 MG tablet, TAKE 1 TABLET(10 MG) BY MOUTH DAILY, Disp: 90 tablet, Rfl: 3   fluticasone (FLONASE) 50 MCG/ACT nasal spray, Place 2 sprays into both nostrils as needed., Disp: 1 g, Rfl: 11   loratadine (CLARITIN) 10 MG tablet, Take 1 tablet (10 mg total) by mouth daily as needed for allergies., Disp: 90 tablet, Rfl: 3  metoprolol succinate (TOPROL-XL) 50 MG 24 hr tablet, TAKE 1 TABLET(50 MG) BY MOUTH DAILY, Disp: 90 tablet, Rfl: 0   Multiple Vitamin (MULTI-VITAMINS) TABS, Take 1 tablet by mouth daily., Disp: , Rfl:    psyllium (METAMUCIL) 58.6 % powder, Take 1 packet by mouth daily. (Patient not taking: Reported on 05/01/2022), Disp: , Rfl:   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/2017   Osteopenia 09/08/2017   Gallstone 12/16/2016   Calcification of abdominal aorta (HCC) 12/16/2016   Advance care planning 08/19/2016   Abdominal wall hernia 07/19/2015   Allergic rhinitis, seasonal 07/19/2015   Blood glucose elevated 07/19/2015   Hypertension goal BP  (blood pressure) < 140/90 07/19/2015   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   Hyperlipidemia LDL goal <70 07/19/2015    Past Surgical History:  Procedure Laterality Date   ABDOMINAL HYSTERECTOMY     BLADDER SURGERY     HAND SURGERY  11/14/2016   removal of foreign body   HERNIA REPAIR     with mesh   HIP ARTHROPLASTY Left 07/04/2020   Procedure: ARTHROPLASTY BIPOLAR HIP (HEMIARTHROPLASTY);  Surgeon: Signa KellPatel, Sunny, MD;  Location: [facility] ORS;  Service: Orthopedics;  Laterality: Left;    Family History  Problem Relation Age of Onset   Hyperlipidemia Mother    Hypertension Mother    Hyperlipidemia Father    Emphysema Father    COPD Father    Breast cancer Neg Hx     Social History   Tobacco Use   Smoking status: Never   Smokeless tobacco: Never   Tobacco comments:    smoking cessation materials not required  Vaping Use   Vaping Use: Never used  Substance Use Topics   Alcohol use: No    Alcohol/week: 0.0 standard drinks of alcohol   Drug use: No     Allergies  Allergen Reactions   Sulfa Antibiotics Shortness Of Breath   Latex     Health Maintenance  Topic Date Due   Zoster Vaccines- Shingrix (1 of 2) Never done   DEXA SCAN  09/08/2019   MAMMOGRAM  07/07/2020   COLON CANCER SCREENING ANNUAL FOBT  08/30/2021   COLONOSCOPY (Pts [id number] Insurance coverage will need to be confirmed)  05/02/2023 (Originally 12/11/2018)   INFLUENZA VACCINE  06/10/2022   TETANUS/TDAP  08/08/2030   Pneumonia Vaccine 3765+ Years old  Completed   Hepatitis C Screening  Completed   HPV VACCINES  Aged Out   COVID-19 Vaccine  Discontinued    Chart Review Today: I personally reviewed active problem list, medication list, allergies, family history, social history, health maintenance, notes from last encounter, lab results, imaging with the patient/caregiver today.   Review of Systems  Constitutional: Negative.   HENT: Negative.    Eyes: Negative.   Respiratory: Negative.    Cardiovascular:  Negative.   Gastrointestinal: Negative.   Endocrine: Negative.   Genitourinary: Negative.   Musculoskeletal: Negative.   Skin: Negative.   Allergic/Immunologic: Negative.   Neurological: Negative.   Hematological: Negative.   Psychiatric/Behavioral: Negative.    All other systems reviewed and are negative.    Objective:   Vitals:   05/01/22 0938 05/01/22 0946  BP: (!) 180/84 Marland Kitchen(!)  174/84  Pulse: 76   Resp: 16   Temp: 98.3 F (36.8 C)   TempSrc: Oral   SpO2: 98%   Weight: 112 lb 1.6 oz (50.8 kg)   Height: 5\' 6"  (1.676 m)     Body mass index is 18.09 kg/m.  Physical Exam Vitals and nursing note reviewed.  Constitutional:      General: She is not in acute distress.    Appearance: Normal appearance. She is well-developed, well-groomed and underweight. She is not ill-appearing, toxic-appearing or diaphoretic.     Comments: Thin, but well appearing  HENT:     Head: Normocephalic and atraumatic.     Right Ear: External ear normal.     Left Ear: External ear normal.     Nose: Nose normal.     Mouth/Throat:     Mouth: Mucous membranes are moist.     Pharynx: Oropharynx is clear.  Eyes:     General: No scleral icterus.       Right eye: No discharge.        Left eye: No discharge.     Conjunctiva/sclera: Conjunctivae normal.  Neck:     Trachea: No tracheal deviation.  Cardiovascular:     Rate and Rhythm: Normal rate and regular rhythm.     Pulses: Normal pulses.     Heart sounds: Normal heart sounds. No murmur heard.    No friction rub. No gallop.  Pulmonary:     Effort: Pulmonary effort is normal. No respiratory distress.     Breath sounds: No stridor.  Abdominal:     General: Bowel sounds are normal.     Palpations: Abdomen is soft.  Musculoskeletal:     Right lower leg: No edema.     Left lower leg: No edema.  Skin:    General: Skin is warm and dry.     Coloration: Skin is not jaundiced.     Findings: No rash.  Neurological:     Mental Status: She is  alert.     Motor: No abnormal muscle tone.     Coordination: Coordination normal.     Gait: Gait abnormal (walks with cane).  Psychiatric:        Attention and Perception: Attention normal.        Mood and Affect: Affect normal. Mood is anxious.        Speech: Speech normal.        Behavior: Behavior normal. Behavior is cooperative.     Comments: Nervous and talkative     ECG interpretation   Date: 05/01/22  Rate: 72  Rhythm: sinus rhythm  QRS Axis: normal  Intervals: 1st degree AV block - PR interval 204 ms  ST/T Wave abnormalities: normal  Conduction Disutrbances: none  Old EKG Reviewed: 07/09/2020 prior ecg was sinus tach w/o 1st degree block otherwise No significant changes noted      Assessment & Plan:   Problem List Items Addressed This Visit       Cardiovascular and Mediastinum   Essential hypertension    BP elevated - hx of white coat HTN, difficult to even monitor at home, she gets anxious every time a BP cuff is put on her  Will try to monitor at home, if readings are >160 on average will add a low dose BP med Explained to pt we will be very careful and conservative and trust her sx and if she feels like BP is low (ie lightheaded near syncopal etc.) we will stop the  medicines but it would be difficult to tell since she states her readings are always high due to anxiety She has no chest pain or pressure and she is fairly active she has no exertional symptoms EKG done today I did not hear any murmurs on exam but may want to have her do a cardiology consult       Relevant Orders   Lipid panel   COMPLETE METABOLIC PANEL WITH GFR   24 hour blood pressure monitor   EKG 12-Lead   Calcification of abdominal aorta (HCC)    On statin and Zetia, monitoring        Musculoskeletal and Integument   Hip fracture due to osteoporosis with routine healing   Relevant Orders   VITAMIN D 25 Hydroxy (Vit-D Deficiency, Fractures)     Other   Hyperlipidemia LDL goal <70 -  Primary    On statin and Zetia Tolerating Exercising often and has a very healthy diet      Relevant Orders   Lipid panel   COMPLETE METABOLIC PANEL WITH GFR   Underweight    We discussed increasing calorie intake, reducing restrictive miss on foods in diet Encouraged her to continue to avoid salt but increase fats and proteins Most of her weight loss occurred after the death of her husband, and declined per flowsheets from 2021-2022, however fairly stable weight for the past year Remeron trial Information given regarding increasing calories, discussed adding an additional boost or Ensure per day + her current boost and 3 meals Close follow-up Wt Readings from Last 10 Encounters:  05/01/22 112 lb 1.6 oz (50.8 kg)  11/06/21 109 lb 6.4 oz (49.6 kg)  08/08/21 110 lb 8 oz (50.1 kg)  07/11/21 111 lb 8 oz (50.6 kg)  05/23/21 113 lb 6.4 oz (51.4 kg)  03/26/21 115 lb 4.8 oz (52.3 kg)  08/16/20 119 lb 14.4 oz (54.4 kg)  08/08/20 125 lb (56.7 kg)  07/04/20 125 lb (56.7 kg)  05/23/20 125 lb 14.4 oz (57.1 kg)          Anxiety    Generalized anxiety, she states much worse since the death of her husband she endorses always being a little nervous Is likely affecting her blood pressure, but otherwise it does have some positive aspects in her life including keeping her working on exercise, healthy diet, and being vigilant Her PHQ and GAD-7 are negative today but she expresses several bothersome symptoms that she did not put on the screenings We discussed a trial of medicine that will help her sleep and may soothe her nerves and also increase her appetite and she is in agreement with this Trial of Remeron at bedtime      Relevant Medications   mirtazapine (REMERON) 15 MG tablet   Other Visit Diagnoses     Medication management       Relevant Orders   Lipid panel   COMPLETE METABOLIC PANEL WITH GFR   Postmenopausal estrogen deficiency       Relevant Orders   DG Bone Density   Need  for shingles vaccine       Relevant Medications   Zoster Vaccine Adjuvanted [facility]) injection   Palpitations       on metorpolol, HR in normal range today   Relevant Orders   CBC with Differential/Platelet   TSH   EKG 12-Lead   Unintentional weight loss       trial of remeron, educated about calorie increase - add additional ensure or boost  a day, close weight f/up   Relevant Medications   mirtazapine (REMERON) 15 MG tablet   Other Relevant Orders   CBC with Differential/Platelet   TSH   Screening for malignant neoplasm of colon       Relevant Orders   Fecal Globin By Immunochemistry   Encounter for screening mammogram for malignant neoplasm of breast       Relevant Orders   MM 3D SCREEN BREAST BILATERAL        Health Maintenance  Topic Date Due   Zoster (Shingles) Vaccine (1 of 2) Never done   DEXA scan (bone density measurement)  09/08/2019   Mammogram  07/07/2020   Stool Blood Test  08/30/2021   Colon Cancer Screening  05/02/2023*   Flu Shot  06/10/2022   Tetanus Vaccine  08/08/2030   Pneumonia Vaccine  Completed   Hepatitis C Screening: USPSTF Recommendation to screen - Ages 18-79 yo.  Completed   HPV Vaccine  Aged Out   COVID-19 Vaccine  Discontinued  *Topic was postponed. The date shown is not the original due date.   Mammogram and Dexa ordered - pt needs to schedule FIT test ordered - pt to complete and mail in for CRC screening  Return in about 3 months (around 08/01/2022) for with me for weight/BP/anxiety recheck.   Danelle Berry, PA-C 05/01/22 10:01 AM

## 2022-05-01 NOTE — Patient Instructions (Addendum)
Please add an additional boost or ensure to your diet daily and do not be too restrictive on foods or calories. With high blood pressure I would try to avoid salt, but do increase protein and fats in diet so that you do not loose more weight  Call [place] to schedule your mammogram and bone density  [facility] [address], [place], [place] [address] Scheduling phone #: [phone]   High-Protein and High-Calorie Diet Eating high-protein and high-calorie foods can help you to gain weight, heal after an injury, and recover after an illness or surgery. The specific amount of daily protein and calories you need depends on: Your body weight. The reason this diet is recommended for you. Generally, a high-protein, high-calorie diet involves: Eating 250-500 extra calories each day. Making sure that you get enough of your daily calories from protein. Ask your health care provider how many of your calories should come from protein. Talk with a health care provider or a dietitian about how much protein and how many calories you need each day. Follow the diet as directed by your health care provider. What are tips for following this plan?  Reading food labels Check the nutrition facts label for calories, grams of fat and protein. Items with more than 4 grams of protein are high-protein foods. Preparing meals Add whole milk, half-and-half, or heavy cream to cereal, pudding, soup, or hot cocoa. Add whole milk to instant breakfast drinks. Add peanut butter to oatmeal or smoothies. Add powdered milk to baked goods, smoothies, or milkshakes. Add powdered milk, cream, or butter to mashed potatoes. Add cheese to cooked vegetables. Make whole-milk yogurt parfaits. Top them with granola, fruit, or nuts. Add cottage cheese to fruit. Add avocado, cheese, or both to sandwiches or salads. Add avocado to smoothies. Add meat, poultry, or seafood to rice, pasta,  casseroles, salads, and soups. Use mayonnaise when making egg salad, chicken salad, or tuna salad. Use peanut butter as a dip for fruits and vegetables or as a topping for pretzels, celery, or crackers. Add beans to casseroles, dips, and spreads. Add pureed beans to sauces and soups. Replace calorie-free drinks with calorie-containing drinks, such as milk and fruit juice. Replace water with milk or heavy cream when making foods such as oatmeal, pudding, or cocoa. Add oil or butter to cooked vegetables and grains. Add cream cheese to sandwiches or as a topping on crackers and bread. Make cream-based pastas and soups. General information Ask your health care provider if you should take a nutritional supplement. Try to eat six small meals each day instead of three large meals. A general goal is to eat every 2 to 3 hours. Eat a balanced diet. In each meal, include one food that is high in protein and one food with fat in it. Keep nutritious snacks available, such as nuts, trail mixes, dried fruit, and yogurt. If you have kidney disease or diabetes, talk with your health care provider about how much protein is safe for you. Too much protein may put extra stress on your kidneys. Drink your calories. Choose high-calorie drinks and have them after your meals. Consider setting a timer to remind you to eat. You will want to eat even if you do not feel very hungry. What high-protein foods should I eat?  Vegetables Soybeans. Peas. Grains Quinoa. Bulgur wheat. Buckwheat. Meats and other proteins Beef, pork, and poultry. Fish and seafood. Eggs. Tofu. Textured vegetable protein (TVP). Peanut butter. Nuts and seeds.  Dried beans. Protein powders. Hummus. Dairy Whole milk. Whole-milk yogurt. Powdered milk. Cheese. [employer]. Eggnog. Beverages High-protein supplement drinks. Soy milk. Other foods Protein bars. The items listed above may not be a complete list of foods and beverages you can eat and  drink. Contact a dietitian for more information. What high-calorie foods should I eat? Fruits Dried fruit. Fruit leather. Canned fruit in syrup. Fruit juice. Avocado. Vegetables Vegetables cooked in oil or butter. Fried potatoes. Grains Pasta. Quick breads. Muffins. Pancakes. Ready-to-eat cereal. Meats and other proteins Peanut butter. Nuts and seeds. Dairy Heavy cream. Whipped cream. Cream cheese. Sour cream. Ice cream. Custard. Pudding. Whole milk dairy products. Beverages Meal-replacement beverages. Nutrition shakes. Fruit juice. Seasonings and condiments Salad dressing. Mayonnaise. Alfredo sauce. Fruit preserves or jelly. Honey. Syrup. Sweets and desserts Cake. Cookies. Pie. Pastries. Candy bars. Chocolate. Fats and oils Butter or margarine. Oil. Gravy. Other foods Meal-replacement bars. The items listed above may not be a complete list of foods and beverages you can eat and drink. Contact a dietitian for more information. Summary A high-protein, high-calorie diet can help you gain weight or heal faster after an injury, illness, or surgery. To increase your protein and calories, add ingredients such as whole milk, peanut butter, cheese, beans, meat, or seafood to meal items. To get enough extra calories each day, include high-calorie foods and beverages at each meal. Adding a high-calorie drink or shake can be an easy way to help you get enough calories each day. Talk with your healthcare provider or dietitian about the best options for you. This information is not intended to replace advice given to you by your health care provider. Make sure you discuss any questions you have with your health care provider. Document Revised: 09/30/2020 Document Reviewed: 09/30/2020 Elsevier Patient Education  2023 [employer].

## 2022-05-01 NOTE — Assessment & Plan Note (Signed)
Generalized anxiety, she states much worse since the death of her husband she endorses always being a little nervous Is likely affecting her blood pressure, but otherwise it does have some positive aspects in her life including keeping her working on exercise, healthy diet, and being vigilant Her PHQ and GAD-7 are negative today but she expresses several bothersome symptoms that she did not put on the screenings We discussed a trial of medicine that will help her sleep and may soothe her nerves and also increase her appetite and she is in agreement with this Trial of Remeron at bedtime

## 2022-05-01 NOTE — Assessment & Plan Note (Signed)
BP elevated - hx of white coat HTN, difficult to even monitor at home, she gets anxious every time a BP cuff is put on her  Will try to monitor at home, if readings are >160 on average will add a low dose BP med Explained to pt we will be very careful and conservative and trust her sx and if she feels like BP is low (ie lightheaded near syncopal etc.) we will stop the medicines but it would be difficult to tell since she states her readings are always high due to anxiety She has no chest pain or pressure and she is fairly active she has no exertional symptoms EKG done today I did not hear any murmurs on exam but may want to have her do a cardiology consult

## 2022-05-01 NOTE — Assessment & Plan Note (Signed)
On statin and Zetia, monitoring

## 2022-05-01 NOTE — Assessment & Plan Note (Signed)
On statin and Zetia Tolerating Exercising often and has a very healthy diet

## 2022-05-02 LAB — LIPID PANEL
Cholesterol: 142 mg/dL (ref <200)
HDL: 58 mg/dL (ref >=50)
LDL Cholesterol (Calc): 63 mg/dL (calc)
Non-HDL Cholesterol (Calc): 84 mg/dL (calc) (ref <130)
Total CHOL/HDL Ratio: 2.4 (calc) (ref <5.0)
Triglycerides: 127 mg/dL (ref <150)

## 2022-05-02 LAB — COMPLETE METABOLIC PANEL WITH GFR
AG Ratio: 1.6 (calc) (ref 1.0–2.5)
ALT: 17 U/L (ref 6–29)
AST: 22 U/L (ref 10–35)
Albumin: 4.8 g/dL (ref 3.6–5.1)
Alkaline phosphatase (APISO): 75 U/L (ref 37–153)
BUN/Creatinine Ratio: 27 (calc) — ABNORMAL HIGH (ref 6–22)
BUN: 16 mg/dL (ref 7–25)
CO2: 26 mmol/L (ref 20–32)
Calcium: 10.3 mg/dL (ref 8.6–10.4)
Chloride: 94 mmol/L — ABNORMAL LOW (ref 98–110)
Creat: 0.59 mg/dL — ABNORMAL LOW (ref 0.60–1.00)
Globulin: 3 g/dL (calc) (ref 1.9–3.7)
Glucose, Bld: 109 mg/dL — ABNORMAL HIGH (ref 65–99)
Potassium: 4.5 mmol/L (ref 3.5–5.3)
Sodium: 134 mmol/L — ABNORMAL LOW (ref 135–146)
Total Bilirubin: 0.4 mg/dL (ref 0.2–1.2)
Total Protein: 7.8 g/dL (ref 6.1–8.1)
eGFR: 95 mL/min/{1.73_m2} (ref >=60)

## 2022-05-02 LAB — CBC WITH DIFFERENTIAL/PLATELET
Absolute Monocytes: 562 cells/uL (ref 200–950)
Basophils Absolute: 39 cells/uL (ref 0–200)
Basophils Relative: 0.5 %
Eosinophils Absolute: 100 cells/uL (ref 15–500)
Eosinophils Relative: 1.3 %
HCT: 43.6 % (ref 35.0–45.0)
Hemoglobin: 14.6 g/dL (ref 11.7–15.5)
Lymphs Abs: 1856 cells/uL (ref 850–3900)
MCH: 29.1 pg (ref 27.0–33.0)
MCHC: 33.5 g/dL (ref 32.0–36.0)
MCV: 86.9 fL (ref 80.0–100.0)
MPV: 9.5 fL (ref 7.5–12.5)
Monocytes Relative: 7.3 %
Neutro Abs: 5144 cells/uL (ref 1500–7800)
Neutrophils Relative %: 66.8 %
Platelets: 217 10*3/uL (ref 140–400)
RBC: 5.02 10*6/uL (ref 3.80–5.10)
RDW: 12.1 % (ref 11.0–15.0)
Total Lymphocyte: 24.1 %
WBC: 7.7 10*3/uL (ref 3.8–10.8)

## 2022-05-02 LAB — VITAMIN D 25 HYDROXY (VIT D DEFICIENCY, FRACTURES): Vit D, 25-Hydroxy: 55 ng/mL (ref 30–100)

## 2022-05-02 LAB — TSH: TSH: 1.99 mIU/L (ref 0.40–4.50)

## 2022-05-09 DIAGNOSIS — I10 Essential (primary) hypertension: Secondary | ICD-10-CM

## 2022-05-23 DIAGNOSIS — H47322 Drusen of optic disc, left eye: Secondary | ICD-10-CM | POA: Diagnosis not present

## 2022-05-23 DIAGNOSIS — H2513 Age-related nuclear cataract, bilateral: Secondary | ICD-10-CM | POA: Diagnosis not present

## 2022-05-23 DIAGNOSIS — H43813 Vitreous degeneration, bilateral: Secondary | ICD-10-CM | POA: Diagnosis not present

## 2022-06-05 ENCOUNTER — Ambulatory Visit (INDEPENDENT_AMBULATORY_CARE_PROVIDER_SITE_OTHER): Payer: PPO

## 2022-06-05 DIAGNOSIS — R195 Other fecal abnormalities: Secondary | ICD-10-CM | POA: Diagnosis not present

## 2022-06-05 DIAGNOSIS — Z0000 Encounter for general adult medical examination without abnormal findings: Secondary | ICD-10-CM

## 2022-06-05 NOTE — Patient Instructions (Signed)

## 2022-06-05 NOTE — Progress Notes (Addendum)
Subjective:  I connected with  Laura Deleon on 06/05/22 by a audio enabled telemedicine application and verified that I am speaking with the correct person using two identifiers.  Patient Location: Home  Provider Location: Office/Clinic  I discussed the limitations of evaluation and management by telemedicine. The patient expressed understanding and agreed to proceed.  Laura Deleon is a 76 y.o. female who presents for Medicare Annual (Subsequent) preventive examination.  Review of Systems    Defer to PCP       Objective:    There were no vitals filed for this visit. There is no height or weight on file to calculate BMI.     05/23/2021    2:40 PM 08/08/2020    3:33 PM 07/04/2020   11:50 AM 07/04/2020    2:00 AM 07/03/2020   10:25 PM 05/10/2020    9:44 AM 05/05/2019   10:25 AM  Advanced Directives  Does Patient Have a Medical Advance Directive? No No No No No No No  Would patient like information on creating a medical advance directive? Yes (MAU/Ambulatory/Procedural Areas - Information given) No - Patient declined  Yes (Inpatient - patient requests chaplain consult to create a medical advance directive)  Yes (MAU/Ambulatory/Procedural Areas - Information given) Yes (MAU/Ambulatory/Procedural Areas - Information given)    Current Medications (verified) Outpatient Encounter Medications as of 06/05/2022  Medication Sig   atorvastatin (LIPITOR) 10 MG tablet Take 1 tablet (10 mg total) by mouth daily.   ezetimibe (ZETIA) 10 MG tablet TAKE 1 TABLET(10 MG) BY MOUTH DAILY   fluticasone (FLONASE) 50 MCG/ACT nasal spray Place 2 sprays into both nostrils as needed.   loratadine (CLARITIN) 10 MG tablet Take 1 tablet (10 mg total) by mouth daily as needed for allergies.   metoprolol succinate (TOPROL-XL) 50 MG 24 hr tablet TAKE 1 TABLET(50 MG) BY MOUTH DAILY   mirtazapine (REMERON) 15 MG tablet Take 1 tablet (15 mg total) by mouth at bedtime. For insomnia, anxiety and for weight    Multiple Vitamin (MULTI-VITAMINS) TABS Take 1 tablet by mouth daily.   psyllium (METAMUCIL) 58.6 % powder Take 1 packet by mouth daily. (Patient not taking: Reported on 05/01/2022)   No facility-administered encounter medications on file as of 06/05/2022.    Allergies (verified) Sulfa antibiotics and Latex   History: Past Medical History:  Diagnosis Date   Abdominal wall hernia 07/19/2015   LLQ, uncomplicated   Allergy    Blood glucose elevated 07/19/2015   Closed left hip fracture (HCC) 07/03/2020   Gallstone 12/16/2016   Hernia of anterior abdominal wall    Hyperlipidemia    Hypertension    Osteopenia 09/08/2017   Oct 2018; next scan => Sep 10, 2019   Past Surgical History:  Procedure Laterality Date   ABDOMINAL HYSTERECTOMY     BLADDER SURGERY     HAND SURGERY  11/14/2016   removal of foreign body   HERNIA REPAIR     with mesh   HIP ARTHROPLASTY Left 07/04/2020   Procedure: ARTHROPLASTY BIPOLAR HIP (HEMIARTHROPLASTY);  Surgeon: Signa Kell, MD;  Location: [facility] ORS;  Service: Orthopedics;  Laterality: Left;   Family History  Problem Relation Age of Onset   Hyperlipidemia Mother    Hypertension Mother    Hyperlipidemia Father    Emphysema Father    COPD Father    Breast cancer Neg Hx    Social History   Socioeconomic History   Marital status: Widowed    Spouse name: Faylene Million  Number of children: 2   Years of education: Not on file   Highest education level: 12th grade  Occupational History   Occupation: Retired  Tobacco Use   Smoking status: Never   Smokeless tobacco: Never   Tobacco comments:    smoking cessation materials not required  Vaping Use   Vaping Use: Never used  Substance and Sexual Activity   Alcohol use: No    Alcohol/week: 0.0 standard drinks of alcohol   Drug use: No   Sexual activity: Not Currently  Other Topics Concern   Not on file  Social History Narrative   Husband passed away 03-06-2016, she is raising her 18 year old great grandson    Social Determinants of Health   Financial Resource Strain: Medium Risk (07/26/2021)   Overall Financial Resource Strain (CARDIA)    Difficulty of Paying Living Expenses: Somewhat hard  Food Insecurity: No Food Insecurity (07/26/2021)   Hunger Vital Sign    Worried About Running Out of Food in the Last Year: Never true    Ran Out of Food in the Last Year: Never true  Transportation Needs: No Transportation Needs (07/26/2021)   PRAPARE - Administrator, Civil Service (Medical): No    Lack of Transportation (Non-Medical): No  Physical Activity: Sufficiently Active (07/26/2021)   Exercise Vital Sign    Days of Exercise per Week: 7 days    Minutes of Exercise per Session: 30 min  Recent Concern: Physical Activity - Insufficiently Active (05/23/2021)   Exercise Vital Sign    Days of Exercise per Week: 7 days    Minutes of Exercise per Session: 20 min  Stress: Stress Concern Present (07/26/2021)   [employer] of Occupational Health - Occupational Stress Questionnaire    Feeling of Stress : To some extent  Social Connections: Socially Isolated (07/26/2021)   Social Connection and Isolation Panel [NHANES]    Frequency of Communication with Friends and Family: Once a week    Frequency of Social Gatherings with Friends and Family: Once a week    Attends Religious Services: Never    Database administrator or Organizations: No    Attends Banker Meetings: Never    Marital Status: Widowed    Tobacco Counseling Counseling given: Not Answered Tobacco comments: smoking cessation materials not required   Clinical Intake:                 Diabetic?N/A         Activities of Daily Living    05/01/2022    9:35 AM 11/06/2021   10:51 AM  In your present state of health, do you have any difficulty performing the following activities:  Hearing? 0 0  Vision? 1 1  Difficulty concentrating or making decisions? 0 0  Walking or climbing stairs? 1 1   Dressing or bathing? 0 0  Doing errands, shopping? 0 0    Patient Care Team: Danelle Berry, PA-C as PCP - General (Family Medicine)  Indicate any recent Medical Services you may have received from other than [facility] providers in the past year (date may be approximate).     Assessment:   This is a routine wellness examination for Laura Deleon.  Hearing/Vision screen No results found.  Dietary issues and exercise activities discussed:     Goals Addressed   None   Depression Screen    05/01/2022    9:35 AM 11/06/2021   10:51 AM 08/08/2021   11:14 AM 07/26/2021  2:53 PM 07/25/2021    4:17 PM 07/11/2021   11:04 AM 05/23/2021    2:36 PM  PHQ 2/9 Scores  PHQ - 2 Score 0 0 1 0 0 0 2  PHQ- 9 Score 0     0 5    Fall Risk    05/01/2022    9:35 AM 11/06/2021   10:50 AM 08/08/2021   11:13 AM 07/25/2021    3:18 PM 07/11/2021   11:04 AM  Fall Risk   Falls in the past year? 0 1 1 1 1   Number falls in past yr: 0 1 0 0 0  Injury with Fall? 0 1 1 1 1   Risk for fall due to : No Fall Risks Impaired balance/gait;Impaired mobility;History of fall(s) Impaired balance/gait;History of fall(s);Impaired mobility;Impaired vision Impaired balance/gait   Follow up Falls prevention discussed;Education provided Falls evaluation completed Falls evaluation completed Falls prevention discussed     FALL RISK PREVENTION PERTAINING TO THE HOME:  Any stairs in or around the home? Yes  If so, are there any without handrails? Yes  Home free of loose throw rugs in walkways, pet beds, electrical cords, etc? Yes  Adequate lighting in your home to reduce risk of falls? Yes   ASSISTIVE DEVICES UTILIZED TO PREVENT FALLS:  Life alert? No  Use of a cane, walker or w/c? Yes  Grab bars in the bathroom? No  Shower chair or bench in shower? No  Elevated toilet seat or a handicapped toilet? No   TIMED UP AND GO:  Was the test performed? No .  Length of time to ambulate 10 feet: N/A sec.     Cognitive Function:         05/10/2020    9:46 AM 05/05/2019   10:36 AM 04/29/2018   10:19 AM  6CIT Screen  What Year? 0 points 0 points 0 points  What month? 0 points 0 points 0 points  What time? 0 points 0 points 0 points  Count back from 20 0 points 0 points 0 points  Months in reverse 0 points 0 points 0 points  Repeat phrase 0 points 2 points 2 points  Total Score 0 points 2 points 2 points    Immunizations Immunization History  Administered Date(s) Administered   Fluad Quad(high Dose 65+) 08/04/2019, 08/16/2020, 08/08/2021   Influenza, High Dose Seasonal PF 08/19/2016, 08/27/2017, 07/30/2018   Influenza,inj,Quad PF,6+ Mos 07/19/2015   Influenza-Unspecified 07/11/2014   Pneumococcal Conjugate-13 08/01/2014   Pneumococcal Polysaccharide-23 02/19/2017   Tdap 11/23/2007, 11/14/2016, 08/08/2020    TDAP status: Up to date  Flu Vaccine status: Up to date  Pneumococcal vaccine status: Up to date  Covid-19 vaccine status: Declined, Education has been provided regarding the importance of this vaccine but patient still declined. Advised may receive this vaccine at local pharmacy or Health Dept.or vaccine clinic. Aware to provide a copy of the vaccination record if obtained from local pharmacy or Health Dept. Verbalized acceptance and understanding.  Qualifies for Shingles Vaccine? Yes   Zostavax completed No   Shingrix Completed?: No.    Education has been provided regarding the importance of this vaccine. Patient has been advised to call insurance company to determine out of pocket expense if they have not yet received this vaccine. Advised may also receive vaccine at local pharmacy or Health Dept. Verbalized acceptance and understanding.  Screening Tests Health Maintenance  Topic Date Due   Zoster Vaccines- Shingrix (1 of 2) Never done   DEXA SCAN  09/08/2019  MAMMOGRAM  07/07/2020   COLON CANCER SCREENING ANNUAL FOBT  08/30/2021   COLONOSCOPY (Pts [id number] Insurance coverage will need to be  confirmed)  05/02/2023 (Originally 12/11/2018)   INFLUENZA VACCINE  06/10/2022   TETANUS/TDAP  08/08/2030   Pneumonia Vaccine 2765+ Years old  Completed   Hepatitis C Screening  Completed   HPV VACCINES  Aged Out   COVID-19 Vaccine  Discontinued    Health Maintenance  Health Maintenance Due  Topic Date Due   Zoster Vaccines- Shingrix (1 of 2) Never done   DEXA SCAN  09/08/2019   MAMMOGRAM  07/07/2020   COLON CANCER SCREENING ANNUAL FOBT  08/30/2021  Ordered 06/05/2022 Mammogram scheduled for 07/03/2022   Lung Cancer Screening: (Low Dose CT Chest recommended if Age 63-80 years, 30 pack-year currently smoking OR have quit w/in 15years.) does not qualify.   Lung Cancer Screening Referral: N/A  Additional Screening:  Hepatitis C Screening: does not qualify; Completed 10/12/2017  Vision Screening: Recommended annual ophthalmology exams for early detection of glaucoma and other disorders of the eye. Is the patient up to date with their annual eye exam?  Yes  Who is the provider or what is the name of the office in which the patient attends annual eye exams? [facility] If pt is not established with a provider, would they like to be referred to a provider to establish care?  N/A .   Dental Screening: Recommended annual dental exams for proper oral hygiene  Community Resource Referral / Chronic Care Management: CRR required this visit?  No   CCM required this visit?  No      Plan:     I have personally reviewed and noted the following in the patient's chart:   Medical and social history Use of alcohol, tobacco or illicit drugs  Current medications and supplements including opioid prescriptions.  Functional ability and status Nutritional status Physical activity Advanced directives List of other physicians Hospitalizations, surgeries, and ER visits in previous 12 months Vitals Screenings to include cognitive, depression, and falls Referrals and appointments  In  addition, I have reviewed and discussed with patient certain preventive protocols, quality metrics, and best practice recommendations. A written personalized care plan for preventive services as well as general preventive health recommendations were provided to patient.     Benay PikeSandra N Dimitry Holsworth, CMA   06/05/2022   Nurse Notes: Non-Face to Face 32 minute visit  [name] , Thank you for taking time to come for your Medicare Wellness Visit. I appreciate your ongoing commitment to your health goals. Please review the following plan we discussed and let me know if I can assist you in the future.   These are the goals we discussed:  Goals      DIET - INCREASE WATER INTAKE     Recommend to drink at least 6-8 8oz glasses of water per day.     Manage My Emotions     Timeframe:  Long-Range Goal Priority:  Medium Start Date:   07/26/21                          Expected End Date:  04/25/22                  - begin personal counseling-if needed - call and visit an old friend - check out volunteer opportunities - talk about feelings with a friend, family or spiritual advisor - continue daily bible studies  - practice  positive thinking and self-talk    Why is this important?   When you are stressed, down or upset, your body reacts too.  For example, your blood pressure may get higher; you may have a headache or stomachache.  When your emotions get the best of you, your body's ability to fight off cold and flu gets weak.  These steps will help you manage your emotions.     Notes:         This is a list of the screening recommended for you and due dates:  Health Maintenance  Topic Date Due   DEXA scan (bone density measurement)  09/08/2019   Mammogram  07/07/2020   Stool Blood Test  08/30/2021   Zoster (Shingles) Vaccine (1 of 2) 09/05/2022*   Colon Cancer Screening  05/02/2023*   Flu Shot  06/10/2022   Tetanus Vaccine  08/08/2030   Pneumonia Vaccine  Completed   Hepatitis C Screening:  USPSTF Recommendation to screen - Ages 18-79 yo.  Completed   HPV Vaccine  Aged Out   COVID-19 Vaccine  Discontinued  *Topic was postponed. The date shown is not the original due date.       I have reviewed this encounter including the documentation in this note and/or discussed this patient with the provider, Freada Bergeron, CMA. I am certifying that I agree with the content of this note as supervising physician.  Alba Cory, MD [facility] 06/09/2022, 5:00 PM

## 2022-06-26 ENCOUNTER — Ambulatory Visit: Payer: Self-pay

## 2022-06-26 NOTE — Chronic Care Management (AMB) (Signed)
   06/26/2022  [name] 07/27/1946 [id number]   Documentation encounter created to complete case transition. The care management team will continue to follow Laura Deleon for care coordination as needed.   [facility] Care Management [phone]

## 2022-07-03 ENCOUNTER — Ambulatory Visit
Admission: RE | Admit: 2022-07-03 | Discharge: 2022-07-03 | Disposition: A | Discharge status: Home / Self Care | Payer: PPO | Source: Ambulatory Visit | Attending: Family Medicine | Admitting: Family Medicine

## 2022-07-03 DIAGNOSIS — M810 Age-related osteoporosis without current pathological fracture: Secondary | ICD-10-CM | POA: Diagnosis not present

## 2022-07-03 DIAGNOSIS — Z1231 Encounter for screening mammogram for malignant neoplasm of breast: Secondary | ICD-10-CM | POA: Diagnosis not present

## 2022-07-03 DIAGNOSIS — Z780 Asymptomatic menopausal state: Secondary | ICD-10-CM | POA: Diagnosis not present

## 2022-07-29 ENCOUNTER — Other Ambulatory Visit: Payer: Self-pay | Admitting: Nurse Practitioner

## 2022-07-29 DIAGNOSIS — I10 Essential (primary) hypertension: Secondary | ICD-10-CM

## 2022-07-29 NOTE — Telephone Encounter (Signed)
Requested Prescriptions  Pending Prescriptions Disp Refills  . metoprolol succinate (TOPROL-XL) 50 MG 24 hr tablet [Pharmacy Med Name: METOPROLOL ER SUCCINATE 50MG  TABS] 90 tablet 1    Sig: TAKE 1 TABLET(50 MG) BY MOUTH DAILY     Cardiovascular:  Beta Blockers Failed - 07/29/2022  7:02 AM      Failed - Last BP in normal range    BP Readings from Last 1 Encounters:  05/01/22 (!) 174/84         Passed - Last Heart Rate in normal range    Pulse Readings from Last 1 Encounters:  05/01/22 76         Passed - Valid encounter within last 6 months    Recent Outpatient Visits          2 months ago Hyperlipidemia LDL goal <70   [facility] Delsa Grana, PA-C   8 months ago Hyperlipidemia LDL goal <70   [facility] Bo Merino, FNP   11 months ago Need for influenza vaccination   [facility] Kathrine Haddock, NP   1 year ago Routine general medical examination at a health care facility   [place], NP   1 year ago Osteoporosis with current pathological fracture with routine healing, unspecified osteoporosis type, subsequent encounter   [facility] Kathrine Haddock, NP      Future Appointments            In 3 days Reece Packer, Myna Hidalgo, [facility], [facility]

## 2022-08-01 ENCOUNTER — Other Ambulatory Visit: Payer: Self-pay

## 2022-08-01 ENCOUNTER — Ambulatory Visit (INDEPENDENT_AMBULATORY_CARE_PROVIDER_SITE_OTHER): Payer: PPO | Admitting: Nurse Practitioner

## 2022-08-01 ENCOUNTER — Encounter: Payer: Self-pay | Admitting: Nurse Practitioner

## 2022-08-01 VITALS — BP 148/86 | HR 97 | Temp 98.1°F | Resp 14 | Ht 66.0 in | Wt 120.4 lb

## 2022-08-01 DIAGNOSIS — I10 Essential (primary) hypertension: Secondary | ICD-10-CM | POA: Diagnosis not present

## 2022-08-01 DIAGNOSIS — F419 Anxiety disorder, unspecified: Secondary | ICD-10-CM

## 2022-08-01 DIAGNOSIS — M810 Age-related osteoporosis without current pathological fracture: Secondary | ICD-10-CM

## 2022-08-01 DIAGNOSIS — R636 Underweight: Secondary | ICD-10-CM

## 2022-08-01 DIAGNOSIS — R634 Abnormal weight loss: Secondary | ICD-10-CM | POA: Diagnosis not present

## 2022-08-01 DIAGNOSIS — Z23 Encounter for immunization: Secondary | ICD-10-CM | POA: Diagnosis not present

## 2022-08-01 MED ORDER — VITAMIN D3 50 MCG (2000 UT) PO CAPS: 2000.0000 [IU] | ORAL_CAPSULE | Freq: Every day | ORAL | 3 refills | Status: DC

## 2022-08-01 NOTE — Progress Notes (Signed)
BP (!) 148/86   Pulse 97   Temp 98.1 F (36.7 C) (Oral)   Resp 14   Ht _0  (1.676 m)   Wt 120 lb 6.4 oz (54.6 kg)   SpO2 98%   BMI 19.43 kg/m    Subjective:    Patient ID: Beryle Lathe, female    DOB: 1946-01-17, 76 y.o.   MRN: [id number]  HPI: Laura Deleon is a 76 y.o. female  Chief Complaint  Patient presents with   Weight Check   Hyperlipidemia   Hypertension    3 month follow up   HTN: Patient's blood pressure was elevated at last visit.  She reports that she has whitecoat syndrome.  Her blood pressure is elevated every time she comes to the doctor.  Blood pressure today is 148/86.  She is currently taking metoprolol 50 mg daily. She denies any chest pain, shortness of breath or headaches.  She reports she has been feeling great lately.  Unintentional weight loss/underweight: Patient's weight at last appointment was 112 lbs.  It was discussed that she increase her caloric intake and was prescribed Remeron 15 mg at bedtime. Patient reports she is feeling much better.  Her weight today is 120 lbs. She has gained weight.   Filed Weights   08/01/22 1021  Weight: 120 lb 6.4 oz (54.6 kg)    Osteoporosis: She had a bone density done on 07/03/2022 which was positive for worsening to osteoporosis.  Discussed weight bearing exercises like walking.  Last vitamin d level was 55 on 05/01/2022.  Recommend starting vitamin d and calcium supplements.  Discussed other treatments but she does not really want to take those medications yet.  Discussed treatment options and she would like to stick with calcium and vitamin d supplements.  Recommend she take calcium citrate 250 mg BID and vitamin  D 2000 units daily.  Will also get PTH.   AP Spine L1-L2 07/03/2022 74.9 Osteoporosis -2.5 0.871 g/cm2 - -   Right Femur Total 07/03/2022 74.9 Osteoporosis -2.9 0.646 g/cm2 -17.9% Yes Right Femur Total 09/07/2017 70.1 Osteopenia -1.8 0.787 g/cm2 - -   Left Forearm Radius 33% 07/03/2022  74.9 Osteoporosis -3.2 0.598 g/cm2 -15.3% Yes Left Forearm Radius 33% 09/07/2017 70.1 Osteopenia -1.9 0.706 g/cm2  Anxiety: Last appointment with Threasa Alpha, PA patient was trialed on Remeron 15 mg at bedtime.Patient did not take the remeron. Patient reports that her mood is much better.     08/01/2022   10:27 AM 06/05/2022    1:52 PM 05/01/2022    9:35 AM 11/06/2021   10:51 AM 08/08/2021   11:14 AM  Depression screen PHQ 2/9  Decreased Interest 0 0 0 0 0  Down, Depressed, Hopeless 0 1 0 0 1  PHQ - 2 Score 0 1 0 0 1  Altered sleeping 1 0 0    Tired, decreased energy 0 0 0    Change in appetite 0 0 0    Feeling bad or failure about yourself  0 0 0    Trouble concentrating 0 0 0    Moving slowly or fidgety/restless 0 0 0    Suicidal thoughts 0 0 0    PHQ-9 Score 1 1 0    Difficult doing work/chores Not difficult at all Not difficult at all Not difficult at all         08/01/2022   10:27 AM 06/05/2022    1:58 PM 05/01/2022    9:46 AM  GAD 7 :  Generalized Anxiety Score  Nervous, Anxious, on Edge 0 1 0  Control/stop worrying 0 1 0  Worry too much - different things 0 1 0  Trouble relaxing 0 3 0  Restless 0 1 0  Easily annoyed or irritable 0 0 0  Afraid - awful might happen 0 0 0  Total GAD 7 Score 0 7 0  Anxiety Difficulty Not difficult at all Not difficult at all Not difficult at all     Relevant past medical, surgical, family and social history reviewed and updated as indicated. Interim medical history since our last visit reviewed. Allergies and medications reviewed and updated.  Review of Systems  Constitutional: Negative for fever , positive for weight change.  Respiratory: Negative for cough and shortness of breath.   Cardiovascular: Negative for chest pain or palpitations.  Gastrointestinal: Negative for abdominal pain, no bowel changes.  Musculoskeletal: Negative for gait problem or joint swelling.  Skin: Negative for rash.  Neurological: Negative for dizziness or  headache.  No other specific complaints in a complete review of systems (except as listed in HPI above).      Objective:    BP (!) 148/86   Pulse 97   Temp 98.1 F (36.7 C) (Oral)   Resp 14   Ht _0  (1.676 m)   Wt 120 lb 6.4 oz (54.6 kg)   SpO2 98%   BMI 19.43 kg/m   Wt Readings from Last 3 Encounters:  08/01/22 120 lb 6.4 oz (54.6 kg)  05/01/22 112 lb 1.6 oz (50.8 kg)  11/06/21 109 lb 6.4 oz (49.6 kg)    Physical Exam  Constitutional: Patient appears well-developed and well-nourished.  No distress.  HEENT: head atraumatic, normocephalic, pupils equal and reactive to light, neck supple Cardiovascular: Normal rate, regular rhythm and normal heart sounds.  No murmur heard. No BLE edema. Pulmonary/Chest: Effort normal and breath sounds normal. No respiratory distress. Abdominal: Soft.  There is no tenderness. Psychiatric: Patient has a normal mood and affect. behavior is normal. Judgment and thought content normal.  Results for orders placed or performed in visit on 05/01/22  Lipid panel  Result Value Ref Range   Cholesterol 142 <200 mg/dL   HDL 58 > OR = 50 mg/dL   Triglycerides 127 <150 mg/dL   LDL Cholesterol (Calc) 63 mg/dL (calc)   Total CHOL/HDL Ratio 2.4 <5.0 (calc)   Non-HDL Cholesterol (Calc) 84 <130 mg/dL (calc)  COMPLETE METABOLIC PANEL WITH GFR  Result Value Ref Range   Glucose, Bld 109 (H) 65 - 99 mg/dL   BUN 16 7 - 25 mg/dL   Creat 0.59 (L) 0.60 - 1.00 mg/dL   eGFR 95 > OR = 60 mL/min/1.107m   BUN/Creatinine Ratio 27 (H) 6 - 22 (calc)   Sodium 134 (L) 135 - 146 mmol/L   Potassium 4.5 3.5 - 5.3 mmol/L   Chloride 94 (L) 98 - 110 mmol/L   CO2 26 20 - 32 mmol/L   Calcium 10.3 8.6 - 10.4 mg/dL   Total Protein 7.8 6.1 - 8.1 g/dL   Albumin 4.8 3.6 - 5.1 g/dL   Globulin 3.0 1.9 - 3.7 g/dL (calc)   AG Ratio 1.6 1.0 - 2.5 (calc)   Total Bilirubin 0.4 0.2 - 1.2 mg/dL   Alkaline phosphatase (APISO) 75 37 - 153 U/L   AST 22 10 - 35 U/L   ALT 17 6 - 29 U/L   VITAMIN D 25 Hydroxy (Vit-D Deficiency, Fractures)  Result Value Ref Range  Vit D, 25-Hydroxy 55 30 - 100 ng/mL  CBC with Differential/Platelet  Result Value Ref Range   WBC 7.7 3.8 - 10.8 Thousand/uL   RBC 5.02 3.80 - 5.10 Million/uL   Hemoglobin 14.6 11.7 - 15.5 g/dL   HCT 43.6 35.0 - 45.0 %   MCV 86.9 80.0 - 100.0 fL   MCH 29.1 27.0 - 33.0 pg   MCHC 33.5 32.0 - 36.0 g/dL   RDW 12.1 11.0 - 15.0 %   Platelets 217 140 - 400 Thousand/uL   MPV 9.5 7.5 - 12.5 fL   Neutro Abs 5,144 1,500 - 7,800 cells/uL   Lymphs Abs 1,856 850 - 3,900 cells/uL   Absolute Monocytes 562 200 - 950 cells/uL   Eosinophils Absolute 100 15 - 500 cells/uL   Basophils Absolute 39 0 - 200 cells/uL   Neutrophils Relative % 66.8 %   Total Lymphocyte 24.1 %   Monocytes Relative 7.3 %   Eosinophils Relative 1.3 %   Basophils Relative 0.5 %  TSH  Result Value Ref Range   TSH 1.99 0.40 - 4.50 mIU/L      Assessment & Plan:   Problem List Items Addressed This Visit       Cardiovascular and Mediastinum   Essential hypertension - Primary    Blood pressure better today 148/86.  Patient reports she has been feeling great.  We will continue with current treatment which is metoprolol 50 mg daily.        Musculoskeletal and Integument   Age-related osteoporosis without current pathological fracture    Discussed with patient that her bone density on 07/03/2022 showed worsening osteoporosis.  Discussed treatment options with patient.  Patient would like to stick with vitamins at this time recommend she take calcium citrate 250 mg twice daily and vitamin D 2000 1000 units daily.  We will also get a PTH.      Relevant Medications   Cholecalciferol (VITAMIN D3) 50 MCG (2000 UT) capsule   Other Relevant Orders   Parathyroid hormone, intact (no Ca)     Other   Unintentional weight loss    Has gained about 8 pounds since last visit.  She get her current weight is under 20 pounds.  Patient states she is feeling  great.  Never took the Remeron.      Underweight    Has gained about 8 pounds since last visit.  She get her current weight is under 20 pounds.  Patient states she is feeling great.  Never took the Remeron.      Anxiety    She reports her mood has been much better.  Patient states she never did take the Remeron.  She says things have gotten much better.      Other Visit Diagnoses     Need for influenza vaccination       Relevant Orders   Flu Vaccine QUAD High Dose(Fluad) (Completed)        Follow up plan: Return in about 3 months (around 10/31/2022) for follow up, Leisa.

## 2022-08-01 NOTE — Assessment & Plan Note (Signed)
Has gained about 8 pounds since last visit.  She get her current weight is under 20 pounds.  Patient states she is feeling great.  Never took the Remeron.

## 2022-08-01 NOTE — Assessment & Plan Note (Signed)
Has gained about 8 pounds since last visit.  She get her current weight is under 20 pounds.  Patient states she is feeling great.  Never took the Remeron. 

## 2022-08-01 NOTE — Assessment & Plan Note (Signed)
Blood pressure better today 148/86.  Patient reports she has been feeling great.  We will continue with current treatment which is metoprolol 50 mg daily.

## 2022-08-01 NOTE — Assessment & Plan Note (Signed)
She reports her mood has been much better.  Patient states she never did take the Remeron.  She says things have gotten much better.

## 2022-08-01 NOTE — Assessment & Plan Note (Signed)
Discussed with patient that her bone density on 07/03/2022 showed worsening osteoporosis.  Discussed treatment options with patient.  Patient would like to stick with vitamins at this time recommend she take calcium citrate 250 mg twice daily and vitamin D 2000 1000 units daily.  We will also get a PTH.

## 2022-08-02 LAB — PARATHYROID HORMONE, INTACT (NO CA): PTH: 35 pg/mL (ref 16–77)

## 2022-10-30 ENCOUNTER — Encounter: Payer: Self-pay | Admitting: Family Medicine

## 2022-10-30 ENCOUNTER — Ambulatory Visit (INDEPENDENT_AMBULATORY_CARE_PROVIDER_SITE_OTHER): Payer: PPO | Admitting: Family Medicine

## 2022-10-30 VITALS — BP 174/88 | HR 82 | Temp 98.4°F | Resp 16 | Ht 66.0 in | Wt 126.6 lb

## 2022-10-30 DIAGNOSIS — I10 Essential (primary) hypertension: Secondary | ICD-10-CM | POA: Diagnosis not present

## 2022-10-30 DIAGNOSIS — Z1211 Encounter for screening for malignant neoplasm of colon: Secondary | ICD-10-CM | POA: Diagnosis not present

## 2022-10-30 DIAGNOSIS — E785 Hyperlipidemia, unspecified: Secondary | ICD-10-CM | POA: Diagnosis not present

## 2022-10-30 DIAGNOSIS — M810 Age-related osteoporosis without current pathological fracture: Secondary | ICD-10-CM

## 2022-10-30 DIAGNOSIS — F419 Anxiety disorder, unspecified: Secondary | ICD-10-CM | POA: Diagnosis not present

## 2022-10-30 MED ORDER — AMLODIPINE BESYLATE 2.5 MG PO TABS: 2.5000 mg | ORAL_TABLET | Freq: Every morning | ORAL | 0 refills | Status: DC

## 2022-10-30 NOTE — Progress Notes (Signed)
Name: Laura Deleon   MRN: [id number]    DOB: 12/19/1945   Date:10/30/2022       Progress Note  Chief Complaint  Patient presents with   Follow-up   Hyperlipidemia   Hypertension   Anxiety     Subjective:   Laura Deleon is a 76 y.o. female, presents to clinic for f/up on chronic conditions  HTN on metoprolol - she reports no SE or concerns, no bradycardia or fatigue, BP routinely elevated in clinic and worse with home monitoring due to anxiety Pulse Readings from Last 3 Encounters:  10/30/22 82  08/01/22 97  05/01/22 76   BP Readings from Last 3 Encounters:  10/30/22 (!) 162/84  08/01/22 (!) 148/86  05/01/22 (!) 174/84   HLD on lipitor 10 and zetia Due for labs Denies CP claudication  On vit D, hip fx, reveiwed Dexa    Current Outpatient Medications:    atenolol (TENORMIN) 50 MG tablet, , Disp: , Rfl:    atorvastatin (LIPITOR) 10 MG tablet, Take 1 tablet (10 mg total) by mouth daily., Disp: 90 tablet, Rfl: 3   atorvastatin (LIPITOR) 40 MG tablet, , Disp: , Rfl:    Cholecalciferol (VITAMIN D3) 50 MCG (2000 UT) capsule, Take 1 capsule (2,000 Units total) by mouth daily., Disp: 90 capsule, Rfl: 3   conjugated estrogens (PREMARIN) vaginal cream, APP 1 GRAM  TO AFFECTED VAGINAL AREA TWICE A WEEK UTD, Disp: , Rfl:    cyclobenzaprine (FLEXERIL) 5 MG tablet, , Disp: , Rfl:    ezetimibe (ZETIA) 10 MG tablet, TAKE 1 TABLET(10 MG) BY MOUTH DAILY, Disp: 90 tablet, Rfl: 3   fluticasone (FLONASE) 50 MCG/ACT nasal spray, Place 2 sprays into both nostrils as needed., Disp: 1 g, Rfl: 11   ibuprofen (ADVIL) 400 MG tablet, TK 1 T PO TID, Disp: , Rfl:    loratadine (CLARITIN) 10 MG tablet, Take 1 tablet (10 mg total) by mouth daily as needed for allergies., Disp: 90 tablet, Rfl: 3   metoprolol succinate (TOPROL-XL) 50 MG 24 hr tablet, TAKE 1 TABLET(50 MG) BY MOUTH DAILY, Disp: 90 tablet, Rfl: 1   Multiple Vitamin (MULTI-VITAMINS) TABS, Take 1 tablet by mouth daily., Disp: ,  Rfl:    traMADol (ULTRAM) 50 MG tablet, TK 1 T PO Q 6 H, Disp: , Rfl:    amoxicillin-clavulanate (AUGMENTIN) 875-125 MG tablet, , Disp: , Rfl:   Patient Active Problem List   Diagnosis Date Noted   Age-related osteoporosis without current pathological fracture 08/01/2022   Anxiety 07/11/2021   Underweight 03/26/2021   Hip fracture due to osteoporosis with routine healing 03/26/2021   Hx of transient ischemic attack (TIA) 02/17/2019   Peripheral neuropathy 08/17/2018   Calcification of abdominal aorta (HCC) 12/16/2016   Unintentional weight loss 08/19/2016   Advance care planning 08/19/2016   Allergic rhinitis, seasonal 07/19/2015   Essential hypertension 07/19/2015   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   Hyperlipidemia LDL goal <70 07/19/2015    Past Surgical History:  Procedure Laterality Date   ABDOMINAL HYSTERECTOMY     BLADDER SURGERY     HAND SURGERY  11/14/2016   removal of foreign body   HERNIA REPAIR     with mesh   HIP ARTHROPLASTY Left 07/04/2020   Procedure: ARTHROPLASTY BIPOLAR HIP (HEMIARTHROPLASTY);  Surgeon: Signa KellPatel, Sunny, MD;  Location: [facility] ORS;  Service: Orthopedics;  Laterality: Left;    Family History  Problem Relation Age of Onset   Hyperlipidemia Mother    Hypertension  Mother    Hyperlipidemia Father    Emphysema Father    COPD Father    Breast cancer Neg Hx     Social History   Tobacco Use   Smoking status: Never   Smokeless tobacco: Never   Tobacco comments:    smoking cessation materials not required  Vaping Use   Vaping Use: Never used  Substance Use Topics   Alcohol use: No    Alcohol/week: 0.0 standard drinks of alcohol   Drug use: No     Allergies  Allergen Reactions   Sulfa Antibiotics Shortness Of Breath   Latex     Health Maintenance  Topic Date Due   COLON CANCER SCREENING ANNUAL FOBT  08/30/2021   Zoster Vaccines- Shingrix (1 of 2) 01/29/2023 (Originally 07/31/1965)   COLONOSCOPY (Pts 45-58yrs Insurance coverage will need to  be confirmed)  05/02/2023 (Originally 12/11/2018)   Medicare Annual Wellness (AWV)  06/06/2023   MAMMOGRAM  07/04/2023   DEXA SCAN  07/03/2024   DTaP/Tdap/Td (4 - Td or Tdap) 08/08/2030   Pneumonia Vaccine 64+ Years old  Completed   INFLUENZA VACCINE  Completed   Hepatitis C Screening  Completed   HPV VACCINES  Aged Out   COVID-19 Vaccine  Discontinued    Chart Review Today: I personally reviewed active problem list, medication list, allergies, family history, social history, health maintenance, notes from last encounter, lab results, imaging with the patient/caregiver today.   Review of Systems  Constitutional: Negative.   HENT: Negative.    Eyes: Negative.   Respiratory: Negative.    Cardiovascular: Negative.   Gastrointestinal: Negative.   Endocrine: Negative.   Genitourinary: Negative.   Musculoskeletal: Negative.   Skin: Negative.   Allergic/Immunologic: Negative.   Neurological: Negative.   Hematological: Negative.   Psychiatric/Behavioral: Negative.    All other systems reviewed and are negative.    Objective:   Vitals:   10/30/22 1025 10/30/22 1031  BP: (!) 168/84 (!) 162/84  Pulse: 82   Resp: 16   Temp: 98.4 F (36.9 C)   TempSrc: Oral   SpO2: 96%   Weight: 126 lb 9.6 oz (57.4 kg)   Height: 5\' 6"  (1.676 m)     Body mass index is 20.43 kg/m.  Physical Exam Vitals and nursing note reviewed.  Constitutional:      General: She is not in acute distress.    Appearance: Normal appearance. She is well-developed and normal weight. She is not ill-appearing, toxic-appearing or diaphoretic.     Comments: Elderly, pleasant, well appearing, looks stated age and slightly anxious  HENT:     Head: Normocephalic and atraumatic.     Right Ear: External ear normal.     Left Ear: External ear normal.     Nose: Nose normal.  Eyes:     General: No scleral icterus.       Right eye: No discharge.        Left eye: No discharge.     Conjunctiva/sclera: Conjunctivae  normal.  Neck:     Trachea: No tracheal deviation.  Cardiovascular:     Rate and Rhythm: Normal rate and regular rhythm.     Pulses: Normal pulses.     Heart sounds: Normal heart sounds. No murmur heard.    No friction rub. No gallop.  Pulmonary:     Effort: Pulmonary effort is normal. No respiratory distress.     Breath sounds: No stridor.  Abdominal:     General: Bowel sounds are normal.  Musculoskeletal:     Right lower leg: No edema.     Left lower leg: No edema.  Skin:    General: Skin is warm and dry.     Findings: No rash.  Neurological:     Mental Status: She is alert.     Motor: No abnormal muscle tone.     Coordination: Coordination normal.     Gait: Gait abnormal.  Psychiatric:        Mood and Affect: Mood normal.        Behavior: Behavior normal.         Assessment & Plan:   Problem List Items Addressed This Visit       Cardiovascular and Mediastinum   Essential hypertension - Primary    Pt has white coat HTN and severe anxiety even with checking BP at home - it has been difficult to determine her normal resting BP and manage accordingly It has been elevated at several OV, she is on BB, repeated today it remained elevated Will try adding low dose norvasc 2.5 and have her recheck in office in 2-3 weeks Repeated readings in clinic today improved slightly to 168/84 and then 162/84 BP Readings from Last 3 Encounters:  10/30/22 (!) 174/88  08/01/22 (!) 148/86  05/01/22 (!) 174/84         Relevant Medications   amLODipine (NORVASC) 2.5 MG tablet     Musculoskeletal and Integument   Age-related osteoporosis without current pathological fracture    Reviewed her recent dexa, will repeat in 2 years She is supplementing with Vit D, continue PT/walking and supplementation and reducing fall risks        Other   Hyperlipidemia LDL goal <70    Good compliance with lipitor and zetia Lab Results  Component Value Date   CHOL 142 05/01/2022   HDL 58  05/01/2022   LDLCALC 63 05/01/2022   TRIG 127 05/01/2022   CHOLHDL 2.4 05/01/2022  Lipids recently checked and LDL at goal       Relevant Medications   amLODipine (NORVASC) 2.5 MG tablet   Anxiety    Pt not currently on meds, often gets anxious about BP or coming to the doctors office    10/30/2022   10:28 AM 08/01/2022   10:27 AM 06/05/2022    1:52 PM  Depression screen PHQ 2/9  Decreased Interest 0 0 0  Down, Depressed, Hopeless 0 0 1  PHQ - 2 Score 0 0 1  Altered sleeping 0 1 0  Tired, decreased energy 0 0 0  Change in appetite 0 0 0  Feeling bad or failure about yourself  0 0 0  Trouble concentrating 0 0 0  Moving slowly or fidgety/restless 0 0 0  Suicidal thoughts 0 0 0  PHQ-9 Score 0 1 1  Difficult doing work/chores Not difficult at all Not difficult at all Not difficult at all  Overall mood is great, phq 9 and GAD 7 reviewed and negative    10/30/2022   10:29 AM 08/01/2022   10:27 AM 06/05/2022    1:58 PM 05/01/2022    9:46 AM  GAD 7 : Generalized Anxiety Score  Nervous, Anxious, on Edge 0 0 1 0  Control/stop worrying 0 0 1 0  Worry too much - different things 0 0 1 0  Trouble relaxing 0 0 3 0  Restless 0 0 1 0  Easily annoyed or irritable 0 0 0 0  Afraid - awful might happen 0 0  0 0  Total GAD 7 Score 0 0 7 0  Anxiety Difficulty Not difficult at all Not difficult at all Not difficult at all Not difficult at all         Other Visit Diagnoses     Screening for malignant neoplasm of colon       Relevant Orders   Fecal Globin By Immunochemistry        Return in about 18 days (around 11/17/2022) for BP med check virtual or in office - just f/up on SE/concerns .  Plan for HTN - add new med, close f/up in office to recheck BP and ensure not SE or concerns Then will need 6 month routine f/up    Danelle Berry, PA-C 10/30/22 10:37 AM

## 2022-10-31 ENCOUNTER — Ambulatory Visit: Payer: PPO | Admitting: Family Medicine

## 2022-11-05 NOTE — Assessment & Plan Note (Signed)
Reviewed her recent dexa, will repeat in 2 years She is supplementing with Vit D, continue PT/walking and supplementation and reducing fall risks

## 2022-11-05 NOTE — Assessment & Plan Note (Signed)
Pt not currently on meds, often gets anxious about BP or coming to the doctors office    10/30/2022   10:28 AM 08/01/2022   10:27 AM 06/05/2022    1:52 PM  Depression screen PHQ 2/9  Decreased Interest 0 0 0  Down, Depressed, Hopeless 0 0 1  PHQ - 2 Score 0 0 1  Altered sleeping 0 1 0  Tired, decreased energy 0 0 0  Change in appetite 0 0 0  Feeling bad or failure about yourself  0 0 0  Trouble concentrating 0 0 0  Moving slowly or fidgety/restless 0 0 0  Suicidal thoughts 0 0 0  PHQ-9 Score 0 1 1  Difficult doing work/chores Not difficult at all Not difficult at all Not difficult at all   Overall mood is great, phq 9 and GAD 7 reviewed and negative    10/30/2022   10:29 AM 08/01/2022   10:27 AM 06/05/2022    1:58 PM 05/01/2022    9:46 AM  GAD 7 : Generalized Anxiety Score  Nervous, Anxious, on Edge 0 0 1 0  Control/stop worrying 0 0 1 0  Worry too much - different things 0 0 1 0  Trouble relaxing 0 0 3 0  Restless 0 0 1 0  Easily annoyed or irritable 0 0 0 0  Afraid - awful might happen 0 0 0 0  Total GAD 7 Score 0 0 7 0  Anxiety Difficulty Not difficult at all Not difficult at all Not difficult at all Not difficult at all

## 2022-11-05 NOTE — Assessment & Plan Note (Signed)
Good compliance with lipitor and zetia Lab Results  Component Value Date   CHOL 142 05/01/2022   HDL 58 05/01/2022   LDLCALC 63 05/01/2022   TRIG 127 05/01/2022   CHOLHDL 2.4 05/01/2022  Lipids recently checked and LDL at goal

## 2022-11-05 NOTE — Assessment & Plan Note (Signed)
Pt has white coat HTN and severe anxiety even with checking BP at home - it has been difficult to determine her normal resting BP and manage accordingly It has been elevated at several OV, she is on BB, repeated today it remained elevated Will try adding low dose norvasc 2.5 and have her recheck in office in 2-3 weeks Repeated readings in clinic today improved slightly to 168/84 and then 162/84 BP Readings from Last 3 Encounters:  10/30/22 (!) 174/88  08/01/22 (!) 148/86  05/01/22 (!) 174/84

## 2022-11-17 ENCOUNTER — Encounter: Payer: Self-pay | Admitting: Family Medicine

## 2022-11-17 ENCOUNTER — Ambulatory Visit (INDEPENDENT_AMBULATORY_CARE_PROVIDER_SITE_OTHER): Payer: PPO | Admitting: Family Medicine

## 2022-11-17 VITALS — BP 162/88 | HR 87 | Temp 98.3°F | Resp 16 | Ht 66.0 in | Wt 125.8 lb

## 2022-11-17 DIAGNOSIS — F419 Anxiety disorder, unspecified: Secondary | ICD-10-CM

## 2022-11-17 DIAGNOSIS — I10 Essential (primary) hypertension: Secondary | ICD-10-CM

## 2022-11-17 NOTE — Progress Notes (Signed)
Patient ID: Laura Deleon, female    DOB: [birth year], 77 y.o.   MRN: [id number]  PCP: Danelle Berry, PA-C  Chief Complaint  Patient presents with   Follow-up   Hypertension    Pt has been complaint with med unable to check at home due to no machine    Subjective:   Laura Deleon is a 77 y.o. female, presents to clinic with CC of the following:  HPI  Here for BP recheck She started amlidiopine 2.5 mg daily in addition to her metoprolol She was unable to check her BP at home, could not find her cuff She was rushing here today, upset when she got here that she forgot her cell phone -history of whitecoat hypertension her blood pressure was significantly elevated when initially checked it decreased a little bit but is still high in office at 162/88 She did add amlodipine to her metoprolol she has not noticed any side effects and she has had no concerns     Patient Active Problem List   Diagnosis Date Noted   Age-related osteoporosis without current pathological fracture 08/01/2022   Anxiety 07/11/2021   Underweight 03/26/2021   Hip fracture due to osteoporosis with routine healing 03/26/2021   Hx of transient ischemic attack (TIA) 02/17/2019   Peripheral neuropathy 08/17/2018   Calcification of abdominal aorta (HCC) 12/16/2016   Unintentional weight loss 08/19/2016   Advance care planning 08/19/2016   Allergic rhinitis, seasonal 07/19/2015   Essential hypertension 07/19/2015   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   Hyperlipidemia LDL goal <70 07/19/2015      Current Outpatient Medications:    amLODipine (NORVASC) 2.5 MG tablet, Take 1 tablet (2.5 mg total) by mouth in the morning., Disp: 90 tablet, Rfl: 0   atorvastatin (LIPITOR) 10 MG tablet, Take 1 tablet (10 mg total) by mouth daily., Disp: 90 tablet, Rfl: 3   ezetimibe (ZETIA) 10 MG tablet, TAKE 1 TABLET(10 MG) BY MOUTH DAILY, Disp: 90 tablet, Rfl: 3   metoprolol succinate (TOPROL-XL) 50 MG 24 hr tablet, TAKE 1  TABLET(50 MG) BY MOUTH DAILY, Disp: 90 tablet, Rfl: 1   Multiple Vitamin (MULTI-VITAMINS) TABS, Take 1 tablet by mouth daily., Disp: , Rfl:    Allergies  Allergen Reactions   Sulfa Antibiotics Shortness Of Breath   Latex      Social History   Tobacco Use   Smoking status: Never   Smokeless tobacco: Never   Tobacco comments:    smoking cessation materials not required  Vaping Use   Vaping Use: Never used  Substance Use Topics   Alcohol use: No    Alcohol/week: 0.0 standard drinks of alcohol   Drug use: No      Chart Review Today: I personally reviewed active problem list, medication list, allergies, family history, social history, health maintenance, notes from last encounter, lab results, imaging with the patient/caregiver today.   Review of Systems  Constitutional: Negative.   HENT: Negative.    Eyes: Negative.   Respiratory: Negative.    Cardiovascular: Negative.   Gastrointestinal: Negative.   Endocrine: Negative.   Genitourinary: Negative.   Musculoskeletal: Negative.   Skin: Negative.   Allergic/Immunologic: Negative.   Neurological: Negative.   Hematological: Negative.   Psychiatric/Behavioral: Negative.    All other systems reviewed and are negative.      Objective:   Vitals:   11/17/22 1113 11/17/22 1148  BP: (!) 184/90 (!) 162/88  Pulse: 93 87  Resp: 16   Temp: 98.3  F (36.8 C)   TempSrc: Oral   SpO2: 98%   Weight: 125 lb 12.8 oz (57.1 kg)   Height: 5\' 6"  (1.676 m)     Body mass index is 20.3 kg/m.  Physical Exam Vitals and nursing note reviewed.  Constitutional:      General: She is not in acute distress.    Appearance: Normal appearance. She is well-developed. She is not ill-appearing, toxic-appearing or diaphoretic.  HENT:     Head: Normocephalic and atraumatic.     Nose: Nose normal.  Eyes:     General:        Right eye: No discharge.        Left eye: No discharge.     Conjunctiva/sclera: Conjunctivae normal.  Neck:      Trachea: No tracheal deviation.  Cardiovascular:     Rate and Rhythm: Normal rate and regular rhythm.     Pulses: Normal pulses.     Heart sounds: Normal heart sounds.  Pulmonary:     Effort: Pulmonary effort is normal. No respiratory distress.     Breath sounds: Normal breath sounds. No stridor.  Musculoskeletal:        General: Normal range of motion.  Skin:    General: Skin is warm and dry.     Findings: No rash.  Neurological:     Mental Status: She is alert.     Motor: No abnormal muscle tone.     Coordination: Coordination normal.  Psychiatric:        Mood and Affect: Affect normal. Mood is anxious.        Behavior: Behavior normal.      Results for orders placed or performed in visit on 08/01/22  Parathyroid hormone, intact (no Ca)  Result Value Ref Range   PTH 35 16 - 77 pg/mL       Assessment & Plan:     ICD-10-CM   1. Essential hypertension  I10    Still elevated in clinic after adding amlodipine, strongly encouraged her to get blood pressure cuff for home monitoring - f/up 2 weeks via phone call    2. White coat syndrome with hypertension  I10    Readings very elevated in the office -goal to help her check at home when she is relaxed    3. Anxiety  F41.9    Anxiety is likely a large part of her elevated blood pressure, very fidgety, anxious appearing, speaks rapidly when in clinic      Would like to avoid overmedicating this patient she recently fell and had a hip fracture -she did not have any side effects on the low-dose amlodipine that we started her on She has not had any symptoms concerning for hypertensive emergency Blood pressure today is still elevated -but I think it is largely due to whitecoat hypertension and anxiety -plan will be for her to continue her metoprolol and amlodipine and find a way to check her blood pressure at home when relaxed.    Telephone/video call f/up in 2 weeks to review home BP readings    Delsa Grana, PA-C 11/17/22  11:32 AM

## 2022-12-11 ENCOUNTER — Other Ambulatory Visit: Payer: Self-pay | Admitting: Nurse Practitioner

## 2022-12-11 DIAGNOSIS — E785 Hyperlipidemia, unspecified: Secondary | ICD-10-CM

## 2022-12-11 NOTE — Telephone Encounter (Signed)
Requested Prescriptions  Pending Prescriptions Disp Refills   atorvastatin (LIPITOR) 10 MG tablet [Pharmacy Med Name: ATORVASTATIN 10MG  TABLETS] 90 tablet 0    Sig: TAKE 1 TABLET(10 MG) BY MOUTH DAILY     Cardiovascular:  Antilipid - Statins Failed - 12/11/2022  3:12 AM      Failed - Lipid Panel in normal range within the last 12 months    Cholesterol, Total  Date Value Ref Range Status  07/20/2015 245 (H) 100 - 199 mg/dL Final   Cholesterol  Date Value Ref Range Status  05/01/2022 142 <200 mg/dL Final   LDL Cholesterol (Calc)  Date Value Ref Range Status  05/01/2022 63 mg/dL (calc) Final    Comment:    Reference range: <100 . Desirable range <100 mg/dL for primary prevention;   <70 mg/dL for patients with CHD or diabetic patients  with > or = 2 CHD risk factors. Marland Kitchen LDL-C is now calculated using the Martin-Hopkins  calculation, which is a validated novel method providing  better accuracy than the Friedewald equation in the  estimation of LDL-C.  Cresenciano Genre et al. Annamaria Helling. 2458;099(83): 2061-2068  (http://education.[facility].com/faq/FAQ164)    HDL  Date Value Ref Range Status  05/01/2022 58 > OR = 50 mg/dL Final  07/20/2015 49 >39 mg/dL Final    Comment:    According to ATP-III Guidelines, HDL-C >59 mg/dL is considered a negative risk factor for CHD.    Triglycerides  Date Value Ref Range Status  05/01/2022 127 <150 mg/dL Final         Passed - Patient is not pregnant      Passed - Valid encounter within last 12 months    Recent Outpatient Visits           3 weeks ago Essential hypertension   [facility] Delsa Grana, PA-C   1 month ago Essential hypertension   [facility] Delsa Grana, PA-C   4 months ago Essential hypertension   [place], FNP   7 months ago Hyperlipidemia LDL goal <70   [facility] Delsa Grana, PA-C   1 year  ago Hyperlipidemia LDL goal <70   [facility] Bo Merino, [place]

## 2023-01-05 ENCOUNTER — Ambulatory Visit: Payer: Self-pay

## 2023-01-05 NOTE — Telephone Encounter (Signed)
Pt is going to see if her daughter can bring her and what day is good. Will call us back.

## 2023-01-05 NOTE — Telephone Encounter (Signed)
  Pt tested positive for covid at home today and would like a nurse to call her back.  CB#  [phone]     Chief Complaint: COVID positive, dry cough, bloody mucus from nose. Declines OV. Wants advice what to take OTC. Symptoms: Above Frequency: Last  Pertinent Negatives: Patient denies fever Disposition: []$ ED /[]$ Urgent Care (no appt availability in office) / []$ Appointment(In office/virtual)/ []$  [place] [facility]/ []$ Home Care/ []$ Refused Recommended Disposition /[]$ [facility]/ [x]$  Follow-up with PCP Additional Notes: Please advise pt.   Answer Assessment - Initial Assessment Questions 1. COVID-19 DIAGNOSIS: "How do you know that you have COVID?" (e.g., positive lab test or self-test, diagnosed by doctor or NP/PA, symptoms after exposure).     Home test 2. COVID-19 EXPOSURE: "Was there any known exposure to COVID before the symptoms began?" CDC Definition of close contact: within 6 feet (2 meters) for a total of 15 minutes or more over a 24-hour period.      No 3. ONSET: "When did the COVID-19 symptoms start?"      Last Wednesday 4. WORST SYMPTOM: "What is your worst symptom?" (e.g., cough, fever, shortness of breath, muscle aches)     Sinus,Body aches, joint pain 5. COUGH: "Do you have a cough?" If Yes, ask: "How bad is the cough?"       Mild 6. FEVER: "Do you have a fever?" If Yes, ask: "What is your temperature, how was it measured, and when did it start?"     No 7. RESPIRATORY STATUS: "Describe your breathing?" (e.g., normal; shortness of breath, wheezing, unable to speak)      Sneezing 8. BETTER-SAME-WORSE: "Are you getting better, staying the same or getting worse compared to yesterday?"  If getting worse, ask, "In what way?"     Same 9. OTHER SYMPTOMS: "Do you have any other symptoms?"  (e.g., chills, fatigue, headache, loss of smell or taste, muscle pain, sore throat)     Body aches 10. HIGH RISK DISEASE: "Do you have any chronic medical problems?" (e.g.,  asthma, heart or lung disease, weak immune system, obesity, etc.)       Yes 11. VACCINE: "Have you had the COVID-19 vaccine?" If Yes, ask: "Which one, how many shots, when did you get it?"       N/a 12. PREGNANCY: "Is there any chance you are pregnant?" "When was your last menstrual period?"       No 13. O2 SATURATION MONITOR:  "Do you use an oxygen saturation monitor (pulse oximeter) at home?" If Yes, ask "What is your reading (oxygen level) today?" "What is your usual oxygen saturation reading?" (e.g., 95%)       No  Protocols used: Coronavirus (COVID-19) Diagnosed or Suspected-A-AH

## 2023-01-23 ENCOUNTER — Other Ambulatory Visit: Payer: Self-pay | Admitting: Family Medicine

## 2023-01-23 DIAGNOSIS — I10 Essential (primary) hypertension: Secondary | ICD-10-CM

## 2023-01-25 ENCOUNTER — Other Ambulatory Visit: Payer: Self-pay | Admitting: Family Medicine

## 2023-01-25 DIAGNOSIS — I10 Essential (primary) hypertension: Secondary | ICD-10-CM

## 2023-01-26 ENCOUNTER — Other Ambulatory Visit: Payer: Self-pay | Admitting: Family Medicine

## 2023-01-26 DIAGNOSIS — I10 Essential (primary) hypertension: Secondary | ICD-10-CM

## 2023-01-26 DIAGNOSIS — E785 Hyperlipidemia, unspecified: Secondary | ICD-10-CM

## 2023-01-26 NOTE — Telephone Encounter (Signed)
Medication Refill - Medication: ezetimibe (ZETIA) 10 MG tablet  Patient stated she took her last pill today.  Has the patient contacted their pharmacy? Yes.   Pharmacy did not have the refill request  Preferred Pharmacy (with phone number or street name):  [facility], [address] Phone: [phone]  Fax: [phone]   Has the patient been seen for an appointment in the last year OR does the patient have an upcoming appointment? Yes.

## 2023-01-27 NOTE — Telephone Encounter (Signed)
[facility] in [place], [place]. Spoke with the Pharmacist Shaun, in regards to medication refill for  Requested Prescriptions   Pending Prescriptions Disp Refills   ezetimibe (ZETIA) 10 MG tablet 90 tablet 3    Sig: TAKE 1 TABLET(10 MG) BY MOUTH DAILY    He states that patient picked up refill for 90 days 01/26/23. There are no remaining refills. Will refuse request until time for next refill in 90 days.

## 2023-02-17 ENCOUNTER — Telehealth: Payer: Self-pay | Admitting: Family Medicine

## 2023-02-17 NOTE — Telephone Encounter (Signed)
Contacted Laura Deleon to schedule their annual wellness visit. Appointment made for 06/11/2023.  [facility] Care Guide [facility] AWV TEAM Direct Dial: [phone]

## 2023-03-23 ENCOUNTER — Ambulatory Visit: Payer: Self-pay | Admitting: *Deleted

## 2023-03-23 NOTE — Telephone Encounter (Signed)
Message from Valora Piccolo sent at 03/23/2023  9:56 AM EDT  Summary: Right Leg & Ankle Swollen   Pt is calling to report swelling in the right leg and ankle. Please advise          Call History   Type Contact Phone/Fax User  03/23/2023 09:55 AM EDT Phone (Incoming) Laura, Whitcomb Deleon (Self) [phone] Rexene Edison) Jens Som A   Reason for Disposition  [1] MODERATE leg swelling (e.g., swelling extends up to knees) AND [2] new-onset or worsening  Answer Assessment - Initial Assessment Questions 1. ONSET: "When did the swelling start?" (e.g., minutes, hours, days)     I'm having swelling in my right leg and ankle.     I had surgery on left hip so I favor my right leg 2021. My ankle is hurting.  When I walk it feels sore.   My leg is bigger than my left leg.  Started 2 weeks ago.     2. LOCATION: "What part of the leg is swollen?"  "Are both legs swollen or just one leg?"     My ankle is red but not swollen.  If I stand a lot it swells.  It's sore.   One little spot hurts.   I was working in the yard.    The redness is spreading.   I'm swollen from my knee down especially in my calf.   It's hot feeling.   It's tight.   The pain is behind the heel above it.     3. SEVERITY: "How bad is the swelling?" (e.g., localized; mild, moderate, severe)   - Localized: Small area of swelling localized to one leg.   - MILD pedal edema: Swelling limited to foot and ankle, pitting edema < 1/4 inch (6 mm) deep, rest and elevation eliminate most or all swelling.   - MODERATE edema: Swelling of lower leg to knee, pitting edema > 1/4 inch (6 mm) deep, rest and elevation only partially reduce swelling.   - SEVERE edema: Swelling extends above knee, facial or hand swelling present.      I have swelling from knee down.   Ankle is red but not swollen.    4. REDNESS: "Does the swelling look red or infected?"     The ankle looks red 5. PAIN: "Is the swelling painful to touch?" If Yes, ask: "How painful is it?"    (Scale 1-10; mild, moderate or severe)     It is sore.  When I walk it's sore.    The swelling goes down some when I elevate my legs.   6. FEVER: "Do you have a fever?" If Yes, ask: "What is it, how was it measured, and when did it start?"      Not asked 7. CAUSE: "What do you think is causing the leg swelling?"     I don't know 8. MEDICAL HISTORY: "Do you have a history of blood clots (e.g., DVT), cancer, heart failure, kidney disease, or liver failure?"     I had phlebitis years ago on left side  9. RECURRENT SYMPTOM: "Have you had leg swelling before?" If Yes, ask: "When was the last time?" "What happened that time?"     No 10. OTHER SYMPTOMS: "Do you have any other symptoms?" (e.g., chest pain, difficulty breathing)       Ankle is red and sore.   Calf is swollen to knee 11. PREGNANCY: "Is there any chance you are pregnant?" "When was your last menstrual period?"  N/A due to age  Protocols used: Leg Swelling and Edema-A-AH

## 2023-03-23 NOTE — Telephone Encounter (Signed)
  Chief Complaint: Right leg swollen for 2 weeks.  Symptoms: Right ankle is sore and has redness that is swelling.   Has a history of Phlebitis years ago. Frequency: The last 2 weeks.   Elevating her legs helps the swelling. Pertinent Negatives: Patient denies blood clot history.  Denies shortness of breath or chest pain. Disposition: [] ED /[] Urgent Care (no appt availability in office) / [x] Appointment(In office/virtual)/ []  [place] [facility]/ [] Home Care/ [] Refused Recommended Disposition /[] [place] [facility]/ []  Follow-up with PCP Additional Notes: Appt made for 03/24/2023 at 11:00 with Danelle Berry, PA-C.

## 2023-03-24 ENCOUNTER — Ambulatory Visit (INDEPENDENT_AMBULATORY_CARE_PROVIDER_SITE_OTHER): Payer: PPO | Admitting: Family Medicine

## 2023-03-24 ENCOUNTER — Ambulatory Visit
Admission: RE | Admit: 2023-03-24 | Discharge: 2023-03-24 | Disposition: A | Discharge status: Home / Self Care | Payer: PPO | Source: Ambulatory Visit | Attending: Family Medicine | Admitting: Family Medicine

## 2023-03-24 VITALS — BP 134/76 | HR 93 | Temp 98.5°F | Resp 16 | Ht 66.0 in | Wt 126.9 lb

## 2023-03-24 DIAGNOSIS — R21 Rash and other nonspecific skin eruption: Secondary | ICD-10-CM

## 2023-03-24 DIAGNOSIS — L309 Dermatitis, unspecified: Secondary | ICD-10-CM

## 2023-03-24 DIAGNOSIS — R600 Localized edema: Secondary | ICD-10-CM

## 2023-03-24 MED ORDER — PREDNISONE 10 MG (21) PO TBPK: ORAL_TABLET | ORAL | 0 refills | Status: DC

## 2023-03-24 NOTE — Progress Notes (Addendum)
Patient ID: [name], female    DOB: 1946-05-31, 77 y.o.   MRN: [id number]  PCP: Danelle Berry, PA-C  Chief Complaint  Patient presents with   Leg Swelling    Right leg onset for 2 weeks   Rash    Bilateral feet red/itches    Subjective:   Laura Deleon is a 77 y.o. female, presents to clinic with CC of the following:  HPI  Rash onset about a month ago - started to left lower leg/ankle, now on both legs and worse on the right She applied hydrocortisone cream to the area off and on, it keeps coming back  Then two weeks ago she has gotten rash and itching to right LE and new progressive swelling moving up her right leg with  associated heaviness in leg, pain in calf   No CP SOB tachycardia No prior DVT but hx of varicose veins and phlebitis   Patient Active Problem List   Diagnosis Date Noted   Age-related osteoporosis without current pathological fracture 08/01/2022   Anxiety 07/11/2021   Underweight 03/26/2021   Hip fracture due to osteoporosis with routine healing 03/26/2021   Hx of transient ischemic attack (TIA) 02/17/2019   Peripheral neuropathy 08/17/2018   Calcification of abdominal aorta (HCC) 12/16/2016   Unintentional weight loss 08/19/2016   Advance care planning 08/19/2016   Allergic rhinitis, seasonal 07/19/2015   Essential hypertension 07/19/2015   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   Hyperlipidemia LDL goal <70 07/19/2015      Current Outpatient Medications:    amLODipine (NORVASC) 2.5 MG tablet, TAKE 1 TABLET(2.5 MG) BY MOUTH IN THE MORNING, Disp: 90 tablet, Rfl: 0   atorvastatin (LIPITOR) 10 MG tablet, TAKE 1 TABLET(10 MG) BY MOUTH DAILY, Disp: 90 tablet, Rfl: 0   ezetimibe (ZETIA) 10 MG tablet, TAKE 1 TABLET(10 MG) BY MOUTH DAILY, Disp: 90 tablet, Rfl: 3   metoprolol succinate (TOPROL-XL) 50 MG 24 hr tablet, TAKE 1 TABLET(50 MG) BY MOUTH DAILY, Disp: 90 tablet, Rfl: 1   Multiple Vitamin (MULTI-VITAMINS) TABS, Take 1 tablet by mouth daily.,  Disp: , Rfl:    Allergies  Allergen Reactions   Sulfa Antibiotics Shortness Of Breath   Latex      Social History   Tobacco Use   Smoking status: Never   Smokeless tobacco: Never   Tobacco comments:    smoking cessation materials not required  Vaping Use   Vaping Use: Never used  Substance Use Topics   Alcohol use: No    Alcohol/week: 0.0 standard drinks of alcohol   Drug use: No      Chart Review Today: I personally reviewed active problem list, medication list, allergies, family history, social history, health maintenance, notes from last encounter, lab results, imaging with the patient/caregiver today.   Review of Systems  Constitutional: Negative.   HENT: Negative.    Eyes: Negative.   Respiratory: Negative.    Cardiovascular: Negative.   Gastrointestinal: Negative.   Endocrine: Negative.   Genitourinary: Negative.   Musculoskeletal: Negative.   Skin: Negative.   Allergic/Immunologic: Negative.   Neurological: Negative.   Hematological: Negative.   Psychiatric/Behavioral: Negative.    All other systems reviewed and are negative.      Objective:   Vitals:   03/24/23 1052 03/24/23 1110  BP: (!) 148/82 134/76  Pulse: 93   Resp: 16   Temp: 98.5 F (36.9 C)   TempSrc: Oral   SpO2: 98%   Weight: 126 lb 14.4  oz (57.6 kg)   Height: 5\' 6"  (1.676 m)     Body mass index is 20.48 kg/m.  Physical Exam Vitals and nursing note reviewed.  Constitutional:      Appearance: She is well-developed.  HENT:     Head: Normocephalic and atraumatic.     Nose: Nose normal.  Eyes:     General:        Right eye: No discharge.        Left eye: No discharge.     Conjunctiva/sclera: Conjunctivae normal.  Neck:     Trachea: No tracheal deviation.  Cardiovascular:     Rate and Rhythm: Normal rate and regular rhythm.     Pulses: Normal pulses.     Heart sounds: Normal heart sounds.  Pulmonary:     Effort: Pulmonary effort is normal. No respiratory distress.      Breath sounds: Normal breath sounds. No stridor.  Musculoskeletal:     Right lower leg: Edema (pitting pretibial, calf tender) present.  Skin:    General: Skin is warm and dry.     Findings: Rash present.  Neurological:     Mental Status: She is alert.     Motor: No abnormal muscle tone.     Coordination: Coordination normal.  Psychiatric:        Behavior: Behavior normal.           Results for orders placed or performed in visit on 08/01/22  Parathyroid hormone, intact (no Ca)  Result Value Ref Range   PTH 35 16 - 77 pg/mL       Assessment & Plan:    1. Edema of right lower leg Onset 2 weeks ago - leg pain/heaviness, calf tender on exam, obvious asymmetry, concern for DVT Stat study Labs for H/H and renal function in case she will get DOAC  - US Venous Img Lower Unilateral Right - BASIC METABOLIC PANEL WITH GFR - CBC with Differential/Platelet  2. Rash and nonspecific skin eruption May be a contact dermatitis - will plan tx after DVT work up - CBC with Differential/Platelet  3. Dermatitis  - CBC with Differential/Platelet      Danelle Berry, PA-C 03/24/23 11:20 AM      4:54 PM Pt imaging results received and reviewed US Venous Img Lower Unilateral Right CLINICAL DATA:  Right lower extremity edema and pain for 2 weeks  EXAM: Right LOWER EXTREMITY VENOUS DOPPLER ULTRASOUND  TECHNIQUE: Gray-scale sonography with compression, as well as color and duplex ultrasound, were performed to evaluate the deep venous system(s) from the level of the common femoral vein through the popliteal and proximal calf veins.  COMPARISON:  None Available.  FINDINGS: VENOUS  Normal compressibility of the common femoral, superficial femoral, and popliteal veins, as well as the visualized calf veins. Visualized portions of profunda femoral vein and great saphenous vein unremarkable. No filling defects to suggest DVT on grayscale or color Doppler imaging. Doppler  waveforms show normal direction of venous flow, normal respiratory plasticity and response to augmentation.  Limited views of the contralateral common femoral vein are unremarkable.  OTHER  Multiple varicose veins along the calf region.  Limitations: none  IMPRESSION: No evidence of right lower extremity DVT. Multiple varicosities along the calf region.  Electronically Signed   By: Karen Kays M.D.   On: 03/24/2023 15:46  Neg for DVT Pt notified by staff, will do steroid taper for rash/dermatitis Pt will need to f/up in office for recheck of rash and swelling  If any worse swelling or leg pain that is severe pt was encouraged to go to the ED

## 2023-03-24 NOTE — Addendum Note (Signed)
Addended by: Danelle Berry on: 03/24/2023 04:55 PM   Modules accepted: Orders

## 2023-03-25 ENCOUNTER — Telehealth: Payer: Self-pay | Admitting: Family Medicine

## 2023-03-25 LAB — BASIC METABOLIC PANEL WITH GFR
BUN/Creatinine Ratio: 28 (calc) — ABNORMAL HIGH (ref 6–22)
BUN: 16 mg/dL (ref 7–25)
CO2: 27 mmol/L (ref 20–32)
Calcium: 9.7 mg/dL (ref 8.6–10.4)
Chloride: 95 mmol/L — ABNORMAL LOW (ref 98–110)
Creat: 0.58 mg/dL — ABNORMAL LOW (ref 0.60–1.00)
Glucose, Bld: 106 mg/dL — ABNORMAL HIGH (ref 65–99)
Potassium: 4.6 mmol/L (ref 3.5–5.3)
Sodium: 133 mmol/L — ABNORMAL LOW (ref 135–146)
eGFR: 94 mL/min/{1.73_m2} (ref >=60)

## 2023-03-25 LAB — CBC WITH DIFFERENTIAL/PLATELET
Absolute Monocytes: 731 cells/uL (ref 200–950)
Basophils Absolute: 44 cells/uL (ref 0–200)
Basophils Relative: 0.5 %
Eosinophils Absolute: 183 cells/uL (ref 15–500)
Eosinophils Relative: 2.1 %
HCT: 41.3 % (ref 35.0–45.0)
Hemoglobin: 14.2 g/dL (ref 11.7–15.5)
Lymphs Abs: 2288 cells/uL (ref 850–3900)
MCH: 29 pg (ref 27.0–33.0)
MCHC: 34.4 g/dL (ref 32.0–36.0)
MCV: 84.3 fL (ref 80.0–100.0)
MPV: 9.6 fL (ref 7.5–12.5)
Monocytes Relative: 8.4 %
Neutro Abs: 5455 cells/uL (ref 1500–7800)
Neutrophils Relative %: 62.7 %
Platelets: 234 10*3/uL (ref 140–400)
RBC: 4.9 10*6/uL (ref 3.80–5.10)
RDW: 12.6 % (ref 11.0–15.0)
Total Lymphocyte: 26.3 %
WBC: 8.7 10*3/uL (ref 3.8–10.8)

## 2023-03-25 NOTE — Telephone Encounter (Signed)
Pt is calling in because she was prescribed predniSONE (STERAPRED UNI-PAK 21 TAB) 10 MG (21) TBPK tablet [[id number]] and pt says she's afraid to take the medication after doing some research. Pt is requesting a nurse call her back regarding the medication.

## 2023-03-26 ENCOUNTER — Ambulatory Visit: Payer: Self-pay

## 2023-03-26 NOTE — Telephone Encounter (Signed)
Called pt back and she was asking about her lab work. I advised patient we have not called her since Laura Deleon has not reviewed them yet, once she does we will call her. I also asked if she had questions about prednisone. She states no more concerns since [name] covered that part with her.

## 2023-03-26 NOTE — Telephone Encounter (Signed)
  Chief Complaint: questions about prednisone Symptoms: none Disposition: [] ED /[] Urgent Care (no appt availability in office) / [] Appointment(In office/virtual)/ []  [place] [facility]/ [x] Home Care/ [] Refused Recommended Disposition /[] [place] [facility]/ []  Follow-up with PCP Additional Notes: n/a Message from Arther Dames sent at 03/26/2023  8:08 AM EDT  Summary: Medication Questions   Patient has questions about how to take the predniSONE (STERAPRED UNI-PAK 21 TAB) 10 MG (21) TBPK tablet that she was given on 03/24/23. Please advise         Reason for Disposition  Caller has medicine question only, adult not sick, AND triager answers question  Answer Assessment - Initial Assessment Questions 1. NAME of MEDICINE: "What medicine(s) are you calling about?"     Steroid  2. QUESTION: "What is your question?" (e.g., double dose of medicine, side effect)     How to take Prednisone 3. PRESCRIBER: "Who prescribed the medicine?" Reason: if prescribed by specialist, call should be referred to that group.     PCP 4. SYMPTOMS: "Do you have any symptoms?" If Yes, ask: "What symptoms are you having?"  "How bad are the symptoms (e.g., mild, moderate, severe)     no 5. PREGNANCY:  "Is there any chance that you are pregnant?" "When was your last menstrual period?"     N/a  Protocols used: Medication Question Call-A-AH

## 2023-03-26 NOTE — Telephone Encounter (Signed)
Called pt back no answer left vm to return call to office.

## 2023-04-20 ENCOUNTER — Other Ambulatory Visit: Payer: Self-pay | Admitting: Family Medicine

## 2023-04-20 ENCOUNTER — Telehealth: Payer: Self-pay | Admitting: Family Medicine

## 2023-04-20 DIAGNOSIS — E785 Hyperlipidemia, unspecified: Secondary | ICD-10-CM

## 2023-04-20 NOTE — Telephone Encounter (Signed)
LVM informing patient to take medication as instructed on medication bottle.

## 2023-04-20 NOTE — Telephone Encounter (Signed)
Pt would like to know if she needs to take her morning medication prior to her appointment tomorrow morning. Pt also wants to know if she needs to fast. Please follow up with pt.

## 2023-04-20 NOTE — Telephone Encounter (Signed)
Lvm for pt to call back and schedule an appt.  °

## 2023-04-21 ENCOUNTER — Other Ambulatory Visit: Payer: Self-pay | Admitting: Family Medicine

## 2023-04-21 ENCOUNTER — Ambulatory Visit (INDEPENDENT_AMBULATORY_CARE_PROVIDER_SITE_OTHER): Payer: PPO | Admitting: Family Medicine

## 2023-04-21 ENCOUNTER — Encounter: Payer: Self-pay | Admitting: Family Medicine

## 2023-04-21 VITALS — BP 160/92 | HR 82 | Temp 98.0°F | Resp 12 | Ht 65.0 in | Wt 127.2 lb

## 2023-04-21 DIAGNOSIS — F419 Anxiety disorder, unspecified: Secondary | ICD-10-CM | POA: Diagnosis not present

## 2023-04-21 DIAGNOSIS — M810 Age-related osteoporosis without current pathological fracture: Secondary | ICD-10-CM

## 2023-04-21 DIAGNOSIS — I10 Essential (primary) hypertension: Secondary | ICD-10-CM

## 2023-04-21 DIAGNOSIS — Z8673 Personal history of transient ischemic attack (TIA), and cerebral infarction without residual deficits: Secondary | ICD-10-CM

## 2023-04-21 DIAGNOSIS — E785 Hyperlipidemia, unspecified: Secondary | ICD-10-CM

## 2023-04-21 DIAGNOSIS — E871 Hypo-osmolality and hyponatremia: Secondary | ICD-10-CM | POA: Diagnosis not present

## 2023-04-21 DIAGNOSIS — R21 Rash and other nonspecific skin eruption: Secondary | ICD-10-CM

## 2023-04-21 DIAGNOSIS — M25561 Pain in right knee: Secondary | ICD-10-CM | POA: Diagnosis not present

## 2023-04-21 DIAGNOSIS — I700 Atherosclerosis of aorta: Secondary | ICD-10-CM | POA: Diagnosis not present

## 2023-04-21 DIAGNOSIS — J302 Other seasonal allergic rhinitis: Secondary | ICD-10-CM

## 2023-04-21 DIAGNOSIS — Z5181 Encounter for therapeutic drug level monitoring: Secondary | ICD-10-CM

## 2023-04-21 DIAGNOSIS — M79604 Pain in right leg: Secondary | ICD-10-CM

## 2023-04-21 DIAGNOSIS — G6289 Other specified polyneuropathies: Secondary | ICD-10-CM

## 2023-04-21 MED ORDER — TRIAMCINOLONE ACETONIDE 0.1 % EX OINT: 1.0000 | TOPICAL_OINTMENT | Freq: Two times a day (BID) | CUTANEOUS | 0 refills | Status: DC | PRN

## 2023-04-21 NOTE — Patient Instructions (Signed)
Do the steroid prescription ointment to raised red itchy places on your right ankle/leg for 1-2 weeks and as soon as it improves reduce the use to once daily and then stop completely  Follow up in office if the rash does not improve, definitely follow up or give [place] a call if it never goes away or if it worsens because I will want to recheck you or have a dermatologist see you.   You should get a call from [facility] to do an evaluation on your knee and right leg pain [facility] [address] [place], [place] [address] Phone: [phone] [email or web address]

## 2023-04-21 NOTE — Progress Notes (Signed)
Name: Rodneisha Tawater   MRN: [id number]    DOB: 03/10/1946   Date:04/21/2023       Progress Note  Chief Complaint  Patient presents with   Follow-up     Subjective:   Tangee Peddle is a 77 y.o. female, presents to clinic for routine f/up  HTN white coat syndrome - BP often high in office on norvasc, metoprolol  BP is again elevated here, checking it at home also makes her anxious She overall feels good, denies CP, SOB, palpitations, exertional sx, LE edema right leg is still bigger then left but has improved BP Readings from Last 3 Encounters:  04/21/23 (!) 160/92  03/24/23 134/76  11/17/22 (!) 162/88     Hyperlipidemia: Currently treated with zetia and lipitor 10 mg, pt reports good med compliance Last Lipids: Lab Results  Component Value Date   CHOL 142 05/01/2022   HDL 58 05/01/2022   LDLCALC 63 05/01/2022   TRIG 127 05/01/2022   CHOLHDL 2.4 05/01/2022   - Denies: Chest pain, shortness of breath, myalgias, claudication   Rash has improved some  (to LE after steroids) but right ankle redness and rash persists and itches   Right leg/calf/knee still larger than left and swelling has improved some and DVT study was negative but it continues to ache from knee down, after left hip surgery her right knee has been bothering her more    Current Outpatient Medications:    amLODipine (NORVASC) 2.5 MG tablet, TAKE 1 TABLET(2.5 MG) BY MOUTH IN THE MORNING, Disp: 90 tablet, Rfl: 0   atorvastatin (LIPITOR) 10 MG tablet, TAKE 1 TABLET(10 MG) BY MOUTH DAILY, Disp: 30 tablet, Rfl: 0   ezetimibe (ZETIA) 10 MG tablet, TAKE 1 TABLET(10 MG) BY MOUTH DAILY, Disp: 90 tablet, Rfl: 3   metoprolol succinate (TOPROL-XL) 50 MG 24 hr tablet, TAKE 1 TABLET(50 MG) BY MOUTH DAILY, Disp: 90 tablet, Rfl: 1   Multiple Vitamin (MULTI-VITAMINS) TABS, Take 1 tablet by mouth daily., Disp: , Rfl:    predniSONE (STERAPRED UNI-PAK 21 TAB) 10 MG (21) TBPK tablet, Take as directed on package.  (60  mg po on day 1, 50 mg po on day 2...) (Patient not taking: Reported on 04/21/2023), Disp: 21 tablet, Rfl: 0  Patient Active Problem List   Diagnosis Date Noted   Age-related osteoporosis without current pathological fracture 08/01/2022   Anxiety 07/11/2021   Underweight 03/26/2021   Hip fracture due to osteoporosis with routine healing 03/26/2021   Hx of transient ischemic attack (TIA) 02/17/2019   Peripheral neuropathy 08/17/2018   Calcification of abdominal aorta (HCC) 12/16/2016   Unintentional weight loss 08/19/2016   Advance care planning 08/19/2016   Allergic rhinitis, seasonal 07/19/2015   Essential hypertension 07/19/2015   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   Hyperlipidemia LDL goal <70 07/19/2015    Past Surgical History:  Procedure Laterality Date   ABDOMINAL HYSTERECTOMY     BLADDER SURGERY     HAND SURGERY  11/14/2016   removal of foreign body   HERNIA REPAIR     with mesh   HIP ARTHROPLASTY Left 07/04/2020   Procedure: ARTHROPLASTY BIPOLAR HIP (HEMIARTHROPLASTY);  Surgeon: Signa Kell, MD;  Location: [facility] ORS;  Service: Orthopedics;  Laterality: Left;    Family History  Problem Relation Age of Onset   Hyperlipidemia Mother    Hypertension Mother    Hyperlipidemia Father    Emphysema Father    COPD Father    Breast cancer Neg Hx  Social History   Tobacco Use   Smoking status: Never   Smokeless tobacco: Never   Tobacco comments:    smoking cessation materials not required  Vaping Use   Vaping Use: Never used  Substance Use Topics   Alcohol use: No    Alcohol/week: 0.0 standard drinks of alcohol   Drug use: No     Allergies  Allergen Reactions   Sulfa Antibiotics Shortness Of Breath   Latex     Health Maintenance  Topic Date Due   COLON CANCER SCREENING ANNUAL FOBT  08/30/2021   Medicare Annual Wellness (AWV)  06/06/2023   Colonoscopy  05/02/2023 (Originally 12/11/2018)   Zoster Vaccines- Shingrix (1 of 2) 07/22/2023 (Originally 07/31/1966)    INFLUENZA VACCINE  06/11/2023   MAMMOGRAM  07/04/2023   DEXA SCAN  07/03/2024   DTaP/Tdap/Td (4 - Td or Tdap) 08/08/2030   Pneumonia Vaccine 43+ Years old  Completed   Hepatitis C Screening  Completed   HPV VACCINES  Aged Out   COVID-19 Vaccine  Discontinued    Chart Review Today: I personally reviewed active problem list, medication list, allergies, family history, social history, health maintenance, notes from last encounter, lab results, imaging with the patient/caregiver today.   Review of Systems  Constitutional: Negative.   HENT: Negative.    Eyes: Negative.   Respiratory: Negative.    Cardiovascular: Negative.   Gastrointestinal: Negative.   Endocrine: Negative.   Genitourinary: Negative.   Musculoskeletal: Negative.   Skin: Negative.   Allergic/Immunologic: Negative.   Neurological: Negative.   Hematological: Negative.   Psychiatric/Behavioral: Negative.    All other systems reviewed and are negative.    Objective:   Vitals:   04/21/23 1158  BP: (!) 160/92  Pulse: 82  Resp: 12  Temp: 98 F (36.7 C)  TempSrc: Oral  SpO2: 97%  Weight: 127 lb 3.2 oz (57.7 kg)  Height: 5\' 5"  (1.651 m)    Body mass index is 21.17 kg/m.  Physical Exam Vitals and nursing note reviewed.  Constitutional:      General: She is not in acute distress.    Appearance: Normal appearance. She is normal weight. She is not ill-appearing, toxic-appearing or diaphoretic.  HENT:     Head: Normocephalic and atraumatic.  Cardiovascular:     Rate and Rhythm: Normal rate and regular rhythm.     Pulses: Normal pulses.     Heart sounds: Normal heart sounds.  Pulmonary:     Effort: Pulmonary effort is normal.     Breath sounds: Normal breath sounds.  Abdominal:     Palpations: Abdomen is soft.  Skin:    General: Skin is warm.     Coloration: Skin is not jaundiced.     Findings: Rash present. No bruising.  Neurological:     Mental Status: Mental status is at baseline.     Gait: Gait  abnormal.  Psychiatric:        Mood and Affect: Mood normal.        Behavior: Behavior normal.         Assessment & Plan:   Problem List Items Addressed This Visit       Cardiovascular and Mediastinum   Essential hypertension    Pt has white coat HTN and severe anxiety even with checking BP at home - it has been difficult to determine her normal resting BP and manage accordingly It has been elevated at several OV, she is on BB we added norvasc 2.5 mg and  it has helped BP some, but still difficult to get baseline readings Have encouraged her to get home BP monitor and see if someone can help get some readings at home when relaxed Send in readings if at goal (<140/90) and come into office sooner if BP remains elevated or she is symptomatic BP Readings from Last 3 Encounters:  04/21/23 (!) 160/92  03/24/23 134/76  11/17/22 (!) 162/88   Labs today         Relevant Orders   COMPLETE METABOLIC PANEL WITH GFR (Completed)   Calcification of abdominal aorta (HCC)   Relevant Orders   COMPLETE METABOLIC PANEL WITH GFR (Completed)   Lipid panel (Completed)     Respiratory   Allergic rhinitis, seasonal (Chronic)     Nervous and Auditory   Peripheral neuropathy     Other   Hyperlipidemia LDL goal <70 - Primary    On lipitor and zetia, good compliance w/o SE or concerns Due for recheck of labs      Relevant Orders   COMPLETE METABOLIC PANEL WITH GFR (Completed)   Lipid panel (Completed)   Hx of transient ischemic attack (TIA)   Anxiety    Often nervous, particularly in office, but she denies daily sx causing negative impact to life and she is not currently on any medications    04/21/2023   12:00 PM 03/24/2023   10:53 AM 11/17/2022   11:13 AM  Depression screen PHQ 2/9  Decreased Interest 0 0 0  Down, Depressed, Hopeless 1 1 0  PHQ - 2 Score 1 1 0  Altered sleeping 1 1 0  Tired, decreased energy 0 0 0  Change in appetite 0 0 0  Feeling bad or failure about yourself  0  0 0  Trouble concentrating 0 0 0  Moving slowly or fidgety/restless 0 0 0  Suicidal thoughts 0 0 0  PHQ-9 Score 2 2 0  Difficult doing work/chores Not difficult at all Somewhat difficult Not difficult at all      04/21/2023   12:00 PM 03/24/2023   10:53 AM 10/30/2022   10:29 AM 08/01/2022   10:27 AM  GAD 7 : Generalized Anxiety Score  Nervous, Anxious, on Edge 0 0 0 0  Control/stop worrying 0 0 0 0  Worry too much - different things 0 0 0 0  Trouble relaxing 0 0 0 0  Restless 0 0 0 0  Easily annoyed or irritable 0 0 0 0  Afraid - awful might happen 0 0 0 0  Total GAD 7 Score 0 0 0 0  Anxiety Difficulty  Not difficult at all Not difficult at all Not difficult at all         Other Visit Diagnoses     White coat syndrome with hypertension       Bp continues to be high in office   Relevant Orders   COMPLETE METABOLIC PANEL WITH GFR (Completed)   Hyponatremia       recheck labs   Relevant Orders   COMPLETE METABOLIC PANEL WITH GFR (Completed)   Osteoporosis, unspecified osteoporosis type, unspecified pathological fracture presence       with recent fx   Relevant Orders   COMPLETE METABOLIC PANEL WITH GFR (Completed)   Encounter for medication monitoring       Relevant Orders   CBC with Differential/Platelet (Completed)   COMPLETE METABOLIC PANEL WITH GFR (Completed)   Lipid panel (Completed)   Rash and nonspecific skin eruption  better but not yet resolved - persisting raised chronic appearing rash to right ankle, topical steroids bid and f/up if not improving, likely needs derm consult   Relevant Medications   triamcinolone ointment (KENALOG) 0.1 %   Acute pain of right knee       worsening achy knee pain that radiates down leg, onset after fx and left hip surgery as she used right leg more, consult ortho   Relevant Orders   Ambulatory referral to Orthopedics   Right leg pain       see above   Relevant Orders   Ambulatory referral to Orthopedics          Plan put on AVS and printed for pt and reviewed: Do the steroid prescription ointment to raised red itchy places on your right ankle/leg for 1-2 weeks and as soon as it improves reduce the use to once daily and then stop completely  Follow up in office if the rash does not improve, definitely follow up or give [place] a call if it never goes away or if it worsens because I will want to recheck you or have a dermatologist see you.   You should get a call from [facility] to do an evaluation on your knee and right leg pain [facility] [address] [place], [place] [address] Phone: [phone] [email or web address]  Return for 4 month f/up BP/HLD, return in 2-4 weeks if rash is not better.   Danelle Berry, PA-C 04/21/23 12:01 PM

## 2023-04-22 LAB — CBC WITH DIFFERENTIAL/PLATELET
Absolute Monocytes: 648 cells/uL (ref 200–950)
Basophils Absolute: 57 cells/uL (ref 0–200)
Basophils Relative: 0.7 %
Eosinophils Absolute: 164 cells/uL (ref 15–500)
Eosinophils Relative: 2 %
HCT: 43.8 % (ref 35.0–45.0)
Hemoglobin: 14.6 g/dL (ref 11.7–15.5)
Lymphs Abs: 2099 cells/uL (ref 850–3900)
MCH: 28.6 pg (ref 27.0–33.0)
MCHC: 33.3 g/dL (ref 32.0–36.0)
MCV: 85.9 fL (ref 80.0–100.0)
MPV: 9.3 fL (ref 7.5–12.5)
Monocytes Relative: 7.9 %
Neutro Abs: 5232 cells/uL (ref 1500–7800)
Neutrophils Relative %: 63.8 %
Platelets: 259 10*3/uL (ref 140–400)
RBC: 5.1 10*6/uL (ref 3.80–5.10)
RDW: 12.6 % (ref 11.0–15.0)
Total Lymphocyte: 25.6 %
WBC: 8.2 10*3/uL (ref 3.8–10.8)

## 2023-04-22 LAB — COMPLETE METABOLIC PANEL WITH GFR
AG Ratio: 1.7 (calc) (ref 1.0–2.5)
ALT: 17 U/L (ref 6–29)
AST: 19 U/L (ref 10–35)
Albumin: 4.4 g/dL (ref 3.6–5.1)
Alkaline phosphatase (APISO): 82 U/L (ref 37–153)
BUN/Creatinine Ratio: 30 (calc) — ABNORMAL HIGH (ref 6–22)
BUN: 17 mg/dL (ref 7–25)
CO2: 24 mmol/L (ref 20–32)
Calcium: 9.7 mg/dL (ref 8.6–10.4)
Chloride: 95 mmol/L — ABNORMAL LOW (ref 98–110)
Creat: 0.56 mg/dL — ABNORMAL LOW (ref 0.60–1.00)
Globulin: 2.6 g/dL (calc) (ref 1.9–3.7)
Glucose, Bld: 90 mg/dL (ref 65–99)
Potassium: 4.5 mmol/L (ref 3.5–5.3)
Sodium: 133 mmol/L — ABNORMAL LOW (ref 135–146)
Total Bilirubin: 0.5 mg/dL (ref 0.2–1.2)
Total Protein: 7 g/dL (ref 6.1–8.1)
eGFR: 95 mL/min/{1.73_m2} (ref >=60)

## 2023-04-22 LAB — LIPID PANEL
Cholesterol: 135 mg/dL (ref <200)
HDL: 58 mg/dL (ref >=50)
LDL Cholesterol (Calc): 62 mg/dL (calc)
Non-HDL Cholesterol (Calc): 77 mg/dL (calc) (ref <130)
Total CHOL/HDL Ratio: 2.3 (calc) (ref <5.0)
Triglycerides: 69 mg/dL (ref <150)

## 2023-04-23 ENCOUNTER — Other Ambulatory Visit: Payer: Self-pay | Admitting: Family Medicine

## 2023-04-23 DIAGNOSIS — I10 Essential (primary) hypertension: Secondary | ICD-10-CM

## 2023-04-23 DIAGNOSIS — E785 Hyperlipidemia, unspecified: Secondary | ICD-10-CM

## 2023-04-23 MED ORDER — METOPROLOL SUCCINATE ER 50 MG PO TB24: ORAL_TABLET | ORAL | 1 refills | Status: DC

## 2023-04-23 MED ORDER — EZETIMIBE 10 MG PO TABS: ORAL_TABLET | ORAL | 3 refills | Status: DC

## 2023-04-23 MED ORDER — AMLODIPINE BESYLATE 2.5 MG PO TABS: ORAL_TABLET | ORAL | 1 refills | Status: DC

## 2023-04-23 MED ORDER — ATORVASTATIN CALCIUM 10 MG PO TABS: ORAL_TABLET | ORAL | 3 refills | Status: DC

## 2023-04-29 NOTE — Assessment & Plan Note (Signed)
Often nervous, particularly in office, but she denies daily sx causing negative impact to life and she is not currently on any medications    04/21/2023   12:00 PM 03/24/2023   10:53 AM 11/17/2022   11:13 AM  Depression screen PHQ 2/9  Decreased Interest 0 0 0  Down, Depressed, Hopeless 1 1 0  PHQ - 2 Score 1 1 0  Altered sleeping 1 1 0  Tired, decreased energy 0 0 0  Change in appetite 0 0 0  Feeling bad or failure about yourself  0 0 0  Trouble concentrating 0 0 0  Moving slowly or fidgety/restless 0 0 0  Suicidal thoughts 0 0 0  PHQ-9 Score 2 2 0  Difficult doing work/chores Not difficult at all Somewhat difficult Not difficult at all      04/21/2023   12:00 PM 03/24/2023   10:53 AM 10/30/2022   10:29 AM 08/01/2022   10:27 AM  GAD 7 : Generalized Anxiety Score  Nervous, Anxious, on Edge 0 0 0 0  Control/stop worrying 0 0 0 0  Worry too much - different things 0 0 0 0  Trouble relaxing 0 0 0 0  Restless 0 0 0 0  Easily annoyed or irritable 0 0 0 0  Afraid - awful might happen 0 0 0 0  Total GAD 7 Score 0 0 0 0  Anxiety Difficulty  Not difficult at all Not difficult at all Not difficult at all

## 2023-04-29 NOTE — Assessment & Plan Note (Signed)
On lipitor and zetia, good compliance w/o SE or concerns Due for recheck of labs

## 2023-04-29 NOTE — Assessment & Plan Note (Signed)
Pt has white coat HTN and severe anxiety even with checking BP at home - it has been difficult to determine her normal resting BP and manage accordingly It has been elevated at several OV, she is on BB we added norvasc 2.5 mg and it has helped BP some, but still difficult to get baseline readings Have encouraged her to get home BP monitor and see if someone can help get some readings at home when relaxed Send in readings if at goal (<140/90) and come into office sooner if BP remains elevated or she is symptomatic BP Readings from Last 3 Encounters:  04/21/23 (!) 160/92  03/24/23 134/76  11/17/22 (!) 162/88   Labs today

## 2023-06-11 ENCOUNTER — Ambulatory Visit (INDEPENDENT_AMBULATORY_CARE_PROVIDER_SITE_OTHER): Payer: PPO

## 2023-06-11 VITALS — BP 122/78 | Ht 65.0 in | Wt 129.4 lb

## 2023-06-11 DIAGNOSIS — Z0000 Encounter for general adult medical examination without abnormal findings: Secondary | ICD-10-CM

## 2023-06-11 NOTE — Patient Instructions (Signed)
Laura Deleon , Thank you for taking time to come for your Medicare Wellness Visit. I appreciate your ongoing commitment to your health goals. Please review the following plan we discussed and let me know if I can assist you in the future.   Referrals/Orders/Follow-Ups/Clinician Recommendations: none  This is a list of the screening recommended for you and due dates:  Health Maintenance  Topic Date Due   Colon Cancer Screening  12/11/2018   Stool Blood Test  08/30/2021   Flu Shot  06/11/2023   Zoster (Shingles) Vaccine (1 of 2) 07/22/2023*   Mammogram  07/04/2023   Medicare Annual Wellness Visit  06/10/2024   DEXA scan (bone density measurement)  07/03/2024   DTaP/Tdap/Td vaccine (4 - Td or Tdap) 08/08/2030   Pneumonia Vaccine  Completed   Hepatitis C Screening  Completed   HPV Vaccine  Aged Out   COVID-19 Vaccine  Discontinued  *Topic was postponed. The date shown is not the original due date.    Advanced directives: (Provided) Advance directive discussed with you today. I have provided a copy for you to complete at home and have notarized. Once this is complete, please bring a copy in to our office so we can scan it into your chart.   Next Medicare Annual Wellness Visit scheduled for next year: Yes 06/16/24 @ 1pm telephone  Preventive Care 65 Years and Older, Female Preventive care refers to lifestyle choices and visits with your health care provider that can promote health and wellness. What does preventive care include? A yearly physical exam. This is also called an annual well check. Dental exams once or twice a year. Routine eye exams. Ask your health care provider how often you should have your eyes checked. Personal lifestyle choices, including: Daily care of your teeth and gums. Regular physical activity. Eating a healthy diet. Avoiding tobacco and drug use. Limiting alcohol use. Practicing safe sex. Taking low-dose aspirin every day. Taking vitamin and mineral supplements  as recommended by your health care provider. What happens during an annual well check? The services and screenings done by your health care provider during your annual well check will depend on your age, overall health, lifestyle risk factors, and family history of disease. Counseling  Your health care provider may ask you questions about your: Alcohol use. Tobacco use. Drug use. Emotional well-being. Home and relationship well-being. Sexual activity. Eating habits. History of falls. Memory and ability to understand (cognition). Work and work Astronomer. Reproductive health. Screening  You may have the following tests or measurements: Height, weight, and BMI. Blood pressure. Lipid and cholesterol levels. These may be checked every 5 years, or more frequently if you are over 41 years old. Skin check. Lung cancer screening. You may have this screening every year starting at age 45 if you have a 30-pack-year history of smoking and currently smoke or have quit within the past 15 years. Fecal occult blood test (FOBT) of the stool. You may have this test every year starting at age 52. Flexible sigmoidoscopy or colonoscopy. You may have a sigmoidoscopy every 5 years or a colonoscopy every 10 years starting at age 39. Hepatitis C blood test. Hepatitis B blood test. Sexually transmitted disease (STD) testing. Diabetes screening. This is done by checking your blood sugar (glucose) after you have not eaten for a while (fasting). You may have this done every 1-3 years. Bone density scan. This is done to screen for osteoporosis. You may have this done starting at age 55. Mammogram. This  may be done every 1-2 years. Talk to your health care provider about how often you should have regular mammograms. Talk with your health care provider about your test results, treatment options, and if necessary, the need for more tests. Vaccines  Your health care provider may recommend certain vaccines, such  as: Influenza vaccine. This is recommended every year. Tetanus, diphtheria, and acellular pertussis (Tdap, Td) vaccine. You may need a Td booster every 10 years. Zoster vaccine. You may need this after age 73. Pneumococcal 13-valent conjugate (PCV13) vaccine. One dose is recommended after age 21. Pneumococcal polysaccharide (PPSV23) vaccine. One dose is recommended after age 39. Talk to your health care provider about which screenings and vaccines you need and how often you need them. This information is not intended to replace advice given to you by your health care provider. Make sure you discuss any questions you have with your health care provider. Document Released: 11/23/2015 Document Revised: 07/16/2016 Document Reviewed: 08/28/2015 Elsevier Interactive Patient Education  2017 [employer].  Fall Prevention in the Home Falls can cause injuries. They can happen to people of all ages. There are many things you can do to make your home safe and to help prevent falls. What can I do on the outside of my home? Regularly fix the edges of walkways and driveways and fix any cracks. Remove anything that might make you trip as you walk through a door, such as a raised step or threshold. Trim any bushes or trees on the path to your home. Use bright outdoor lighting. Clear any walking paths of anything that might make someone trip, such as rocks or tools. Regularly check to see if handrails are loose or broken. Make sure that both sides of any steps have handrails. Any raised decks and porches should have guardrails on the edges. Have any leaves, snow, or ice cleared regularly. Use sand or salt on walking paths during winter. Clean up any spills in your garage right away. This includes oil or grease spills. What can I do in the bathroom? Use night lights. Install grab bars by the toilet and in the tub and shower. Do not use towel bars as grab bars. Use non-skid mats or decals in the tub or  shower. If you need to sit down in the shower, use a plastic, non-slip stool. Keep the floor dry. Clean up any water that spills on the floor as soon as it happens. Remove soap buildup in the tub or shower regularly. Attach bath mats securely with double-sided non-slip rug tape. Do not have throw rugs and other things on the floor that can make you trip. What can I do in the bedroom? Use night lights. Make sure that you have a light by your bed that is easy to reach. Do not use any sheets or blankets that are too big for your bed. They should not hang down onto the floor. Have a firm chair that has side arms. You can use this for support while you get dressed. Do not have throw rugs and other things on the floor that can make you trip. What can I do in the kitchen? Clean up any spills right away. Avoid walking on wet floors. Keep items that you use a lot in easy-to-reach places. If you need to reach something above you, use a strong step stool that has a grab bar. Keep electrical cords out of the way. Do not use floor polish or wax that makes floors slippery. If you must  use wax, use non-skid floor wax. Do not have throw rugs and other things on the floor that can make you trip. What can I do with my stairs? Do not leave any items on the stairs. Make sure that there are handrails on both sides of the stairs and use them. Fix handrails that are broken or loose. Make sure that handrails are as long as the stairways. Check any carpeting to make sure that it is firmly attached to the stairs. Fix any carpet that is loose or worn. Avoid having throw rugs at the top or bottom of the stairs. If you do have throw rugs, attach them to the floor with carpet tape. Make sure that you have a light switch at the top of the stairs and the bottom of the stairs. If you do not have them, ask someone to add them for you. What else can I do to help prevent falls? Wear shoes that: Do not have high heels. Have  rubber bottoms. Are comfortable and fit you well. Are closed at the toe. Do not wear sandals. If you use a stepladder: Make sure that it is fully opened. Do not climb a closed stepladder. Make sure that both sides of the stepladder are locked into place. Ask someone to hold it for you, if possible. Clearly mark and make sure that you can see: Any grab bars or handrails. First and last steps. Where the edge of each step is. Use tools that help you move around (mobility aids) if they are needed. These include: Canes. Walkers. Scooters. Crutches. Turn on the lights when you go into a dark area. Replace any light bulbs as soon as they burn out. Set up your furniture so you have a clear path. Avoid moving your furniture around. If any of your floors are uneven, fix them. If there are any pets around you, be aware of where they are. Review your medicines with your doctor. Some medicines can make you feel dizzy. This can increase your chance of falling. Ask your doctor what other things that you can do to help prevent falls. This information is not intended to replace advice given to you by your health care provider. Make sure you discuss any questions you have with your health care provider. Document Released: 08/23/2009 Document Revised: 04/03/2016 Document Reviewed: 12/01/2014 Elsevier Interactive Patient Education  2017 [employer].

## 2023-06-11 NOTE — Progress Notes (Signed)
Subjective:   Laura Deleon is a 77 y.o. female who presents for Medicare Annual (Subsequent) preventive examination.  Visit Complete: In person  Patient Medicare AWV questionnaire was completed by the patient on (not done); I have confirmed that all information answered by patient is correct and no changes since this date.  Review of Systems    Cardiac Risk Factors include: advanced age (>52men, >5 women);dyslipidemia;hypertension    Objective:    Today's Vitals   06/11/23 1304 06/11/23 1318  BP: 122/78   Weight: 129 lb 6.4 oz (58.7 kg)   Height: 5\' 5"  (1.651 m)   PainSc:  0-No pain   Body mass index is 21.53 kg/m.     06/11/2023    1:40 PM 05/23/2021    2:40 PM 08/08/2020    3:33 PM 07/04/2020   11:50 AM 07/04/2020    2:00 AM 07/03/2020   10:25 PM 05/10/2020    9:44 AM  Advanced Directives  Does Patient Have a Medical Advance Directive? No No No No No No No  Type of Diplomatic Services operational officer;Living will        Would patient like information on creating a medical advance directive?  Yes (MAU/Ambulatory/Procedural Areas - Information given) No - Patient declined  Yes (Inpatient - patient requests chaplain consult to create a medical advance directive)  Yes (MAU/Ambulatory/Procedural Areas - Information given)    Current Medications (verified) Outpatient Encounter Medications as of 06/11/2023  Medication Sig   amLODipine (NORVASC) 2.5 MG tablet TAKE 1 TABLET(2.5 MG) BY MOUTH IN THE MORNING   atorvastatin (LIPITOR) 10 MG tablet TAKE 1 TABLET(10 MG) BY MOUTH DAILY at bedtime   ezetimibe (ZETIA) 10 MG tablet TAKE 1 TABLET(10 MG) BY MOUTH DAILY   metoprolol succinate (TOPROL-XL) 50 MG 24 hr tablet TAKE 1 TABLET(50 MG) BY MOUTH DAILY take after a meal   Multiple Vitamin (MULTI-VITAMINS) TABS Take 1 tablet by mouth daily.   triamcinolone ointment (KENALOG) 0.1 % Apply 1 Application topically 2 (two) times daily as needed (area of raised red rash/itchy).    No facility-administered encounter medications on file as of 06/11/2023.    Allergies (verified) Sulfa antibiotics and Latex   History: Past Medical History:  Diagnosis Date   Abdominal wall hernia 07/19/2015   LLQ, uncomplicated   Allergy    Blood glucose elevated 07/19/2015   Closed left hip fracture (HCC) 07/03/2020   Gallstone 12/16/2016   Hernia of anterior abdominal wall    Hyperlipidemia    Hypertension    Osteopenia 09/08/2017   Oct 2018; next scan => Sep 10, 2019   Past Surgical History:  Procedure Laterality Date   ABDOMINAL HYSTERECTOMY     BLADDER SURGERY     HAND SURGERY  11/14/2016   removal of foreign body   HERNIA REPAIR     with mesh   HIP ARTHROPLASTY Left 07/04/2020   Procedure: ARTHROPLASTY BIPOLAR HIP (HEMIARTHROPLASTY);  Surgeon: Signa Kell, MD;  Location: [facility] ORS;  Service: Orthopedics;  Laterality: Left;   Family History  Problem Relation Age of Onset   Hyperlipidemia Mother    Hypertension Mother    Hyperlipidemia Father    Emphysema Father    COPD Father    Breast cancer Neg Hx    Social History   Socioeconomic History   Marital status: Widowed    Spouse name: Faylene Million   Number of children: 2   Years of education: Not on file   Highest education level: 12th  grade  Occupational History   Occupation: Retired  Tobacco Use   Smoking status: Never   Smokeless tobacco: Never   Tobacco comments:    smoking cessation materials not required  Vaping Use   Vaping status: Never Used  Substance and Sexual Activity   Alcohol use: No    Alcohol/week: 0.0 standard drinks of alcohol   Drug use: No   Sexual activity: Not Currently  Other Topics Concern   Not on file  Social History Narrative   Husband passed away 2016-07-08, she is raising her 63 year old great grandson   Social Determinants of Health   Financial Resource Strain: Medium Risk (06/11/2023)   Overall Financial Resource Strain (CARDIA)    Difficulty of Paying Living Expenses: Somewhat  hard  Food Insecurity: No Food Insecurity (06/11/2023)   Hunger Vital Sign    Worried About Running Out of Food in the Last Year: Never true    Ran Out of Food in the Last Year: Never true  Transportation Needs: No Transportation Needs (06/11/2023)   PRAPARE - Administrator, Civil Service (Medical): No    Lack of Transportation (Non-Medical): No  Physical Activity: Sufficiently Active (06/11/2023)   Exercise Vital Sign    Days of Exercise per Week: 7 days    Minutes of Exercise per Session: 30 min  Stress: No Stress Concern Present (06/11/2023)   [employer] of Occupational Health - Occupational Stress Questionnaire    Feeling of Stress : Only a little  Social Connections: Moderately Isolated (06/11/2023)   Social Connection and Isolation Panel [NHANES]    Frequency of Communication with Friends and Family: Once a week    Frequency of Social Gatherings with Friends and Family: More than three times a week    Attends Religious Services: More than 4 times per year    Active Member of [employer] or Organizations: No    Attends Banker Meetings: Never    Marital Status: Widowed    Tobacco Counseling Counseling given: Not Answered Tobacco comments: smoking cessation materials not required   Clinical Intake:  Pre-visit preparation completed: Yes  Pain : No/denies pain Pain Score: 0-No pain     BMI - recorded: 21.53 Nutritional Status: BMI of 19-24  Normal Nutritional Risks: None Diabetes: No  How often do you need to have someone help you when you read instructions, pamphlets, or other written materials from your doctor or pharmacy?: 1 - Never  Interpreter Needed?: No  Comments: great grandson lives with her and helps with household Information entered by :: B.Soliyana Mcchristian,LPN   Activities of Daily Living    06/11/2023    1:41 PM 04/21/2023   12:00 PM  In your present state of health, do you have any difficulty performing the following activities:   Hearing? 0 0  Vision? 0 0  Difficulty concentrating or making decisions? 0 0  Walking or climbing stairs? 1 1  Dressing or bathing? 0 0  Doing errands, shopping? 0 0  Preparing Food and eating ? N   Using the Toilet? N   In the past six months, have you accidently leaked urine? Y   Do you have problems with loss of bowel control? N   Managing your Medications? N   Managing your Finances? N   Housekeeping or managing your Housekeeping? N     Patient Care Team: Danelle Berry, PA-C as PCP - General (Family Medicine) [facility], [facility] (Optometry)  Indicate any recent Medical Services you  may have received from other than [facility] providers in the past year (date may be approximate).     Assessment:   This is a routine wellness examination for [place].  Hearing/Vision screen Hearing Screening - Comments:: Adequate hearing Vision Screening - Comments:: Adequate vision with glasses; near-sighted  Eye  Dietary issues and exercise activities discussed:     Goals Addressed             This Visit's Progress    DIET - INCREASE WATER INTAKE   On track    Recommend to drink at least 6-8 8oz glasses of water per day.     Manage My Emotions   On track    Timeframe:  Long-Range Goal Priority:  Medium Start Date:   07/26/21                          Expected End Date:  04/25/22                  - begin personal counseling-if needed - call and visit an old friend - check out volunteer opportunities - talk about feelings with a friend, family or spiritual advisor - continue daily bible studies  - practice positive thinking and self-talk    Why is this important?   When you are stressed, down or upset, your body reacts too.  For example, your blood pressure may get higher; you may have a headache or stomachache.  When your emotions get the best of you, your body's ability to fight off cold and flu gets weak.  These steps will help you manage your emotions.     Notes:         Depression Screen    06/11/2023    1:33 PM 04/21/2023   12:00 PM 03/24/2023   10:53 AM 11/17/2022   11:13 AM 10/30/2022   10:28 AM 08/01/2022   10:27 AM 06/05/2022    1:52 PM  PHQ 2/9 Scores  PHQ - 2 Score 0 1 1 0 0 0 1  PHQ- 9 Score  2 2 0 0 1 1    Fall Risk    06/11/2023    1:25 PM 04/21/2023   12:00 PM 03/24/2023   10:48 AM 11/17/2022   11:13 AM 10/30/2022   10:20 AM  Fall Risk   Falls in the past year? 0 0 0 0 0  Number falls in past yr: 0  0 0 0  Injury with Fall? 0  0 0 0  Risk for fall due to : No Fall Risks No Fall Risks No Fall Risks No Fall Risks No Fall Risks  Follow up Education provided;Falls prevention discussed Falls prevention discussed;Education provided Falls prevention discussed;Education provided;Falls evaluation completed Falls prevention discussed;Education provided;Falls evaluation completed Education provided;Falls evaluation completed;Falls prevention discussed    MEDICARE RISK AT HOME:  Medicare Risk at Home - 06/11/23 1325     Any stairs in or around the home? Yes    If so, are there any without handrails? Yes    Home free of loose throw rugs in walkways, pet beds, electrical cords, etc? Yes    Adequate lighting in your home to reduce risk of falls? Yes    Life alert? No    Use of a cane, walker or w/c? Yes   cane   Grab bars in the bathroom? No    Shower chair or bench in shower? No    Elevated toilet seat or  a handicapped toilet? No             TIMED UP AND GO:  Was the test performed?  Yes  Length of time to ambulate 10 feet: 12 sec Gait slow and steady with assistive device    Cognitive Function:        06/11/2023    1:48 PM 06/05/2022    1:57 PM 05/10/2020    9:46 AM 05/05/2019   10:36 AM 04/29/2018   10:19 AM  6CIT Screen  What Year? 0 points 0 points 0 points 0 points 0 points  What month? 0 points 0 points 0 points 0 points 0 points  What time? 0 points 0 points 0 points 0 points 0 points  Count back from 20 0 points 0 points  0 points 0 points 0 points  Months in reverse 2 points 0 points 0 points 0 points 0 points  Repeat phrase 0 points 0 points 0 points 2 points 2 points  Total Score 2 points 0 points 0 points 2 points 2 points    Immunizations Immunization History  Administered Date(s) Administered   Fluad Quad(high Dose 65+) 08/04/2019, 08/16/2020, 08/08/2021, 08/01/2022   Influenza, High Dose Seasonal PF 08/19/2016, 08/27/2017, 07/30/2018   Influenza,inj,Quad PF,6+ Mos 07/19/2015   Influenza-Unspecified 07/11/2014   Pneumococcal Conjugate-13 08/01/2014   Pneumococcal Polysaccharide-23 02/19/2017   Tdap 11/23/2007, 11/14/2016, 08/08/2020    TDAP status: Up to date  Flu Vaccine status: Up to date  Pneumococcal vaccine status: Up to date  Covid-19 vaccine status: Completed vaccines DISCONTINUED  Qualifies for Shingles Vaccine? Yes   Zostavax completed No   Shingrix Completed?: No.    Education has been provided regarding the importance of this vaccine. Patient has been advised to call insurance company to determine out of pocket expense if they have not yet received this vaccine. Advised may also receive vaccine at local pharmacy or Health Dept. Verbalized acceptance and understanding.  Screening Tests Health Maintenance  Topic Date Due   Colonoscopy  12/11/2018   COLON CANCER SCREENING ANNUAL FOBT  08/30/2021   INFLUENZA VACCINE  06/11/2023   Zoster Vaccines- Shingrix (1 of 2) 07/22/2023 (Originally 07/31/1965)   MAMMOGRAM  07/04/2023   Medicare Annual Wellness (AWV)  06/10/2024   DEXA SCAN  07/03/2024   DTaP/Tdap/Td (4 - Td or Tdap) 08/08/2030   Pneumonia Vaccine 45+ Years old  Completed   Hepatitis C Screening  Completed   HPV VACCINES  Aged Out   COVID-19 Vaccine  Discontinued    Health Maintenance  Health Maintenance Due  Topic Date Due   Colonoscopy  12/11/2018   COLON CANCER SCREENING ANNUAL FOBT  08/30/2021   INFLUENZA VACCINE  06/11/2023    Colorectal cancer screening: No  longer required.   Mammogram status: No longer required due to age.  Bone Density status: Completed yes. Results reflect: Bone density results: OSTEOPOROSIS. Repeat every 3 years.  Lung Cancer Screening: (Low Dose CT Chest recommended if Age 77-80 years, 20 pack-year currently smoking OR have quit w/in 15years.) does not qualify.   Lung Cancer Screening Referral: no  Additional Screening:  Hepatitis C Screening: does not qualify; Completed yes  Vision Screening: Recommended annual ophthalmology exams for early detection of glaucoma and other disorders of the eye. Is the patient up to date with their annual eye exam?  Yes  Who is the provider or what is the name of the office in which the patient attends annual eye exams? [facility] If pt is  not established with a provider, would they like to be referred to a provider to establish care? No .   Dental Screening: Recommended annual dental exams for proper oral hygiene  Diabetic Foot Exam: n/a  Community Resource Referral / Chronic Care Management: CRR required this visit?  No   CCM required this visit?  No     Plan:     I have personally reviewed and noted the following in the patient's chart:   Medical and social history Use of alcohol, tobacco or illicit drugs  Current medications and supplements including opioid prescriptions. Patient is not currently taking opioid prescriptions. Functional ability and status Nutritional status Physical activity Advanced directives List of other physicians Hospitalizations, surgeries, and ER visits in previous 12 months Vitals Screenings to include cognitive, depression, and falls Referrals and appointments  In addition, I have reviewed and discussed with patient certain preventive protocols, quality metrics, and best practice recommendations. A written personalized care plan for preventive services as well as general preventive health recommendations were provided to patient.      Sue Lush, LPN   11/15/1094   After Visit Summary: PRINTED AND GIVEN TO PT  Nurse Notes: The patient states she is doing well and has no concerns or questions at this time. Pt does request Flonase refill . I informed pt it was not on her medlist. Pt says she will make appt if nasal congestion/runny nose worsens.

## 2023-08-20 ENCOUNTER — Ambulatory Visit: Payer: PPO | Admitting: Family Medicine

## 2023-08-27 ENCOUNTER — Encounter: Payer: Self-pay | Admitting: Physician Assistant

## 2023-08-27 ENCOUNTER — Ambulatory Visit (INDEPENDENT_AMBULATORY_CARE_PROVIDER_SITE_OTHER): Payer: PPO | Admitting: Physician Assistant

## 2023-08-27 VITALS — BP 154/82 | HR 92 | Temp 97.8°F | Resp 16 | Ht 65.0 in | Wt 129.5 lb

## 2023-08-27 DIAGNOSIS — Z1231 Encounter for screening mammogram for malignant neoplasm of breast: Secondary | ICD-10-CM

## 2023-08-27 DIAGNOSIS — Z23 Encounter for immunization: Secondary | ICD-10-CM

## 2023-08-27 DIAGNOSIS — I10 Essential (primary) hypertension: Secondary | ICD-10-CM

## 2023-08-27 MED ORDER — METOPROLOL SUCCINATE ER 50 MG PO TB24: ORAL_TABLET | ORAL | 1 refills | Status: DC

## 2023-08-27 MED ORDER — AMLODIPINE BESYLATE 5 MG PO TABS: 5.0000 mg | ORAL_TABLET | Freq: Every day | ORAL | 0 refills | Status: DC

## 2023-08-27 NOTE — Patient Instructions (Signed)
I am increasing your Amlodipine to 5 mg once per day  You can pick up the new script at your pharmacy once it is ready   If you want,  you can take 2 tablets of your Amlodipine 2.5 mg tablets until they run out and then start the one tablet per day of the Amlodipine 5 mg after that  If possible, please get a blood pressure cuff to check your blood pressure at home at least 3 times per week You should write down these results and bring them to your follow up apt in 4 weeks so we can see what your blood pressure is like at home   Please let us know if you have further questions or concerns.

## 2023-08-27 NOTE — Assessment & Plan Note (Signed)
Chronic, ongoing Unsure of outside BP readings but past few office results have been elevated and well above goal Will try increasing Amlodipine to 5 mg PO every day for further control Continue metoprolol 50 mg PO every day Encouraged her to check BP at home for monitoring and log results for review at follow up  Follow up in 4 weeks or sooner if concerns arise

## 2023-08-27 NOTE — Progress Notes (Signed)
Established Patient Office Visit  Name: Laura Deleon   MRN: [id number]    DOB: 1945-11-24   Date:08/27/2023  Today's Provider: Jacquelin Hawking, MHS, PA-C Introduced myself to the patient as a PA-C and provided education on APPs in clinical practice.         Subjective  Chief Complaint  Chief Complaint  Patient presents with   Hypertension   Hyperlipidemia    HPI  HYPERTENSION / HYPERLIPIDEMIA Satisfied with current treatment? yes Duration of hypertension: years BP monitoring frequency: not checking BP range:  BP medication side effects: no Past BP meds: Amlodipine 2.5 mg PO every day, metoprolol 50 mg PO every day  Duration of hyperlipidemia: years Cholesterol medication side effects: no Cholesterol supplements: none Past cholesterol medications: atorvastain (lipitor) and ezetimide (zetia) Medication compliance: good compliance Aspirin: no Recent stressors: no Recurrent headaches: no Visual changes: no Palpitations: no Dyspnea: no Chest pain: no Lower extremity edema: no Dizzy/lightheaded: no   Patient Active Problem List   Diagnosis Date Noted   Age-related osteoporosis without current pathological fracture 08/01/2022   Anxiety 07/11/2021   Underweight 03/26/2021   Hip fracture due to osteoporosis with routine healing 03/26/2021   Hx of transient ischemic attack (TIA) 02/17/2019   Peripheral neuropathy 08/17/2018   Calcification of abdominal aorta (HCC) 12/16/2016   Unintentional weight loss 08/19/2016   Advance care planning 08/19/2016   Allergic rhinitis, seasonal 07/19/2015   Essential hypertension 07/19/2015   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   Hyperlipidemia LDL goal <70 07/19/2015    Past Surgical History:  Procedure Laterality Date   ABDOMINAL HYSTERECTOMY     BLADDER SURGERY     HAND SURGERY  11/14/2016   removal of foreign body   HERNIA REPAIR     with mesh   HIP ARTHROPLASTY Left 07/04/2020   Procedure: ARTHROPLASTY BIPOLAR HIP  (HEMIARTHROPLASTY);  Surgeon: Signa Kell, MD;  Location: [facility] ORS;  Service: Orthopedics;  Laterality: Left;    Family History  Problem Relation Age of Onset   Hyperlipidemia Mother    Hypertension Mother    Hyperlipidemia Father    Emphysema Father    COPD Father    Breast cancer Neg Hx     Social History   Tobacco Use   Smoking status: Never   Smokeless tobacco: Never   Tobacco comments:    smoking cessation materials not required  Substance Use Topics   Alcohol use: No    Alcohol/week: 0.0 standard drinks of alcohol     Current Outpatient Medications:    atorvastatin (LIPITOR) 10 MG tablet, TAKE 1 TABLET(10 MG) BY MOUTH DAILY at bedtime, Disp: 90 tablet, Rfl: 3   ezetimibe (ZETIA) 10 MG tablet, TAKE 1 TABLET(10 MG) BY MOUTH DAILY, Disp: 90 tablet, Rfl: 3   Multiple Vitamin (MULTI-VITAMINS) TABS, Take 1 tablet by mouth daily., Disp: , Rfl:    triamcinolone ointment (KENALOG) 0.1 %, Apply 1 Application topically 2 (two) times daily as needed (area of raised red rash/itchy)., Disp: 30 g, Rfl: 0   amLODipine (NORVASC) 5 MG tablet, Take 1 tablet (5 mg total) by mouth daily. TAKE 1 TABLET(2.5 MG) BY MOUTH IN THE MORNING, Disp: 60 tablet, Rfl: 0   metoprolol succinate (TOPROL-XL) 50 MG 24 hr tablet, TAKE 1 TABLET(50 MG) BY MOUTH DAILY take after a meal, Disp: 90 tablet, Rfl: 1  Allergies  Allergen Reactions   Sulfa Antibiotics Shortness Of Breath   Latex     I  personally reviewed active problem list, medication list, allergies, health maintenance, notes from last encounter, lab results with the patient/caregiver today.   Review of Systems  Constitutional:  Negative for chills, fever and malaise/fatigue.  HENT:         Right sided sinus pressure - chronic    Eyes:  Negative for blurred vision, double vision and photophobia.  Respiratory:  Negative for cough, shortness of breath and wheezing.   Cardiovascular:  Negative for chest pain, palpitations and leg swelling.   Neurological:  Positive for headaches. Negative for dizziness and tingling.      Objective  Vitals:   08/27/23 1125 08/27/23 1140  BP: (!) 164/82 (!) 154/82  Pulse: 92   Resp: 16   Temp: 97.8 F (36.6 C)   TempSrc: Oral   SpO2: 99%   Weight: 129 lb 8 oz (58.7 kg)   Height: 5\' 5"  (1.651 m)     Body mass index is 21.55 kg/m.  Physical Exam Vitals reviewed.  Constitutional:      General: She is awake.     Appearance: Normal appearance. She is well-developed.  HENT:     Head: Normocephalic and atraumatic.  Cardiovascular:     Rate and Rhythm: Normal rate and regular rhythm.     Pulses: Normal pulses.          Radial pulses are 2+ on the right side and 2+ on the left side.     Heart sounds: Normal heart sounds. No murmur heard.    No friction rub. No gallop.  Pulmonary:     Effort: Pulmonary effort is normal.     Breath sounds: Normal breath sounds. No decreased air movement. No decreased breath sounds, wheezing, rhonchi or rales.  Musculoskeletal:     Right lower leg: No edema.     Left lower leg: No edema.  Neurological:     General: No focal deficit present.     Mental Status: She is alert and oriented to person, place, and time.     GCS: GCS eye subscore is 4. GCS verbal subscore is 5. GCS motor subscore is 6.  Psychiatric:        Attention and Perception: Attention and perception normal.        Mood and Affect: Mood and affect normal.        Speech: Speech normal.        Behavior: Behavior normal. Behavior is cooperative.        Thought Content: Thought content normal.      No results found for this or any previous visit (from the past 2160 hour(s)).   PHQ2/9:    08/27/2023   11:24 AM 06/11/2023    1:33 PM 04/21/2023   12:00 PM 03/24/2023   10:53 AM 11/17/2022   11:13 AM  Depression screen PHQ 2/9  Decreased Interest 0 0 0 0 0  Down, Depressed, Hopeless 0 0 1 1 0  PHQ - 2 Score 0 0 1 1 0  Altered sleeping 0  1 1 0  Tired, decreased energy 0  0 0 0   Change in appetite 0  0 0 0  Feeling bad or failure about yourself  0  0 0 0  Trouble concentrating 0  0 0 0  Moving slowly or fidgety/restless 0  0 0 0  Suicidal thoughts 0  0 0 0  PHQ-9 Score 0  2 2 0  Difficult doing work/chores Not difficult at all Not difficult at all Not  difficult at all Somewhat difficult Not difficult at all      Fall Risk:    08/27/2023   11:24 AM 06/11/2023    1:25 PM 04/21/2023   12:00 PM 03/24/2023   10:48 AM 11/17/2022   11:13 AM  Fall Risk   Falls in the past year? 0 0 0 0 0  Number falls in past yr: 0 0  0 0  Injury with Fall? 0 0  0 0  Risk for fall due to : No Fall Risks No Fall Risks No Fall Risks No Fall Risks No Fall Risks  Follow up Falls prevention discussed;Education provided;Falls evaluation completed Education provided;Falls prevention discussed Falls prevention discussed;Education provided Falls prevention discussed;Education provided;Falls evaluation completed Falls prevention discussed;Education provided;Falls evaluation completed      Functional Status Survey: Is the patient deaf or have difficulty hearing?: No Does the patient have difficulty seeing, even when wearing glasses/contacts?: No Does the patient have difficulty concentrating, remembering, or making decisions?: No Does the patient have difficulty walking or climbing stairs?: Yes Does the patient have difficulty dressing or bathing?: No Does the patient have difficulty doing errands alone such as visiting a doctor's office or shopping?: No    Assessment & Plan  Problem List Items Addressed This Visit       Cardiovascular and Mediastinum   Essential hypertension - Primary    Chronic, ongoing Unsure of outside BP readings but past few office results have been elevated and well above goal Will try increasing Amlodipine to 5 mg PO every day for further control Continue metoprolol 50 mg PO every day Encouraged her to check BP at home for monitoring and log results for  review at follow up  Follow up in 4 weeks or sooner if concerns arise       Relevant Medications   metoprolol succinate (TOPROL-XL) 50 MG 24 hr tablet   amLODipine (NORVASC) 5 MG tablet   Other Visit Diagnoses     Encounter for screening mammogram for malignant neoplasm of breast       Relevant Orders   MM 3D SCREENING MAMMOGRAM BILATERAL BREAST   Need for influenza vaccination       Relevant Orders   Flu Vaccine Trivalent High Dose (Fluad) (Completed)        Return in about 4 weeks (around 09/24/2023) for HTN- med change .   I, Brionne Mertz E Karmello Abercrombie, PA-C, have reviewed all documentation for this visit. The documentation on 08/27/23 for the exam, diagnosis, procedures, and orders are all accurate and complete.   Jacquelin Hawking, MHS, PA-C [facility]

## 2023-09-29 ENCOUNTER — Ambulatory Visit: Payer: Self-pay | Admitting: *Deleted

## 2023-09-29 ENCOUNTER — Encounter: Payer: Self-pay | Admitting: Physician Assistant

## 2023-09-29 ENCOUNTER — Ambulatory Visit (INDEPENDENT_AMBULATORY_CARE_PROVIDER_SITE_OTHER): Payer: PPO | Admitting: Physician Assistant

## 2023-09-29 VITALS — BP 148/84 | HR 106 | Resp 16 | Ht 65.0 in | Wt 131.1 lb

## 2023-09-29 DIAGNOSIS — I10 Essential (primary) hypertension: Secondary | ICD-10-CM | POA: Diagnosis not present

## 2023-09-29 MED ORDER — VALSARTAN 40 MG PO TABS: 40.0000 mg | ORAL_TABLET | Freq: Every day | ORAL | 0 refills | Status: DC

## 2023-09-29 MED ORDER — METOPROLOL SUCCINATE ER 50 MG PO TB24: 25.0000 mg | ORAL_TABLET | Freq: Every day | ORAL | Status: DC

## 2023-09-29 MED ORDER — AMLODIPINE BESYLATE 5 MG PO TABS: 5.0000 mg | ORAL_TABLET | Freq: Every day | ORAL | 1 refills | Status: DC

## 2023-09-29 NOTE — Telephone Encounter (Signed)
  Chief Complaint: For Erin Mecum, PA-C   Amlodipine 5 mg Symptoms: See message from pharmacy.   Needing clarification  Frequency: N/A Pertinent Negatives: Patient denies N/A Disposition: [] ED /[] Urgent Care (no appt availability in office) / [] Appointment(In office/virtual)/ []  [place] [facility]/ [] Home Care/ [] Refused Recommended Disposition /[]  Mobile Bus/ [x]  Follow-up with PCP Additional Notes: Message sent to [employer], PA-C

## 2023-09-29 NOTE — Progress Notes (Signed)
Established Patient Office Visit  Name: Laura Deleon   MRN: [id number]    DOB: [birth year]   Date:09/29/2023  Today's Provider: Jacquelin Hawking, MHS, PA-C Introduced myself to the patient as a PA-C and provided education on APPs in clinical practice.         Subjective  Chief Complaint  Chief Complaint  Patient presents with   Hypertension    Has been compliant with her meds    HPI  She reports she has been feeling well and denies side effects with medications She has not been checking BP at home- states she does not have meter    Patient Active Problem List   Diagnosis Date Noted   Age-related osteoporosis without current pathological fracture 08/01/2022   Anxiety 07/11/2021   Underweight 03/26/2021   Hip fracture due to osteoporosis with routine healing 03/26/2021   Hx of transient ischemic attack (TIA) 02/17/2019   Peripheral neuropathy 08/17/2018   Calcification of abdominal aorta (HCC) 12/16/2016   Unintentional weight loss 08/19/2016   Advance care planning 08/19/2016   Allergic rhinitis, seasonal 07/19/2015   Essential hypertension 07/19/2015   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   Hyperlipidemia LDL goal <70 07/19/2015    Past Surgical History:  Procedure Laterality Date   ABDOMINAL HYSTERECTOMY     BLADDER SURGERY     HAND SURGERY  11/14/2016   removal of foreign body   HERNIA REPAIR     with mesh   HIP ARTHROPLASTY Left 07/04/2020   Procedure: ARTHROPLASTY BIPOLAR HIP (HEMIARTHROPLASTY);  Surgeon: Signa Kell, MD;  Location: [facility] ORS;  Service: Orthopedics;  Laterality: Left;    Family History  Problem Relation Age of Onset   Hyperlipidemia Mother    Hypertension Mother    Hyperlipidemia Father    Emphysema Father    COPD Father    Breast cancer Neg Hx     Social History   Tobacco Use   Smoking status: Never   Smokeless tobacco: Never   Tobacco comments:    smoking cessation materials not required  Substance Use Topics   Alcohol  use: No    Alcohol/week: 0.0 standard drinks of alcohol     Current Outpatient Medications:    valsartan (DIOVAN) 40 MG tablet, Take 1 tablet (40 mg total) by mouth daily., Disp: 60 tablet, Rfl: 0   amLODipine (NORVASC) 5 MG tablet, Take 1 tablet (5 mg total) by mouth daily. TAKE 1 TABLET(2.5 MG) BY MOUTH IN THE MORNING, Disp: 90 tablet, Rfl: 1   atorvastatin (LIPITOR) 10 MG tablet, TAKE 1 TABLET(10 MG) BY MOUTH DAILY at bedtime, Disp: 90 tablet, Rfl: 3   ezetimibe (ZETIA) 10 MG tablet, TAKE 1 TABLET(10 MG) BY MOUTH DAILY, Disp: 90 tablet, Rfl: 3   metoprolol succinate (TOPROL-XL) 50 MG 24 hr tablet, Take 0.5 tablets (25 mg total) by mouth daily. For one week then take one half tablet every other day for one week. Stop after the second week, Disp: , Rfl:    Multiple Vitamin (MULTI-VITAMINS) TABS, Take 1 tablet by mouth daily., Disp: , Rfl:    triamcinolone ointment (KENALOG) 0.1 %, Apply 1 Application topically 2 (two) times daily as needed (area of raised red rash/itchy)., Disp: 30 g, Rfl: 0  Allergies  Allergen Reactions   Sulfa Antibiotics Shortness Of Breath   Latex     I personally reviewed active problem list, medication list, allergies, notes from last encounter with the patient/caregiver today.  Review of Systems  Eyes:  Negative for blurred vision, double vision and photophobia.  Respiratory:  Negative for shortness of breath and wheezing.   Cardiovascular:  Negative for chest pain, palpitations and leg swelling.  Musculoskeletal:  Negative for falls.  Neurological:  Negative for dizziness, loss of consciousness, weakness and headaches.      Objective  Vitals:   09/29/23 1049 09/29/23 1135  BP: (!) 144/80 (!) 148/84  Pulse: (!) 106   Resp: 16   SpO2: 97%   Weight: 131 lb 1.6 oz (59.5 kg)   Height: 5\' 5"  (1.651 m)     Body mass index is 21.82 kg/m.  Physical Exam Vitals reviewed.  Constitutional:      General: She is awake.     Appearance: Normal  appearance. She is well-developed and well-groomed.  HENT:     Head: Normocephalic and atraumatic.  Cardiovascular:     Rate and Rhythm: Normal rate and regular rhythm.     Pulses: Normal pulses.          Radial pulses are 2+ on the right side and 2+ on the left side.     Heart sounds: Normal heart sounds. No murmur heard.    No friction rub. No gallop.  Pulmonary:     Effort: Pulmonary effort is normal.     Breath sounds: Normal breath sounds. No decreased air movement. No decreased breath sounds, wheezing, rhonchi or rales.  Musculoskeletal:     Cervical back: Normal range of motion.     Right lower leg: No edema.     Left lower leg: No edema.  Neurological:     General: No focal deficit present.     Mental Status: She is alert and oriented to person, place, and time. Mental status is at baseline.     GCS: GCS eye subscore is 4. GCS verbal subscore is 5. GCS motor subscore is 6.  Psychiatric:        Attention and Perception: Attention and perception normal.        Mood and Affect: Mood and affect normal.        Speech: Speech normal.        Behavior: Behavior normal. Behavior is cooperative.        Thought Content: Thought content normal.        Cognition and Memory: Cognition normal.      No results found for this or any previous visit (from the past 2160 hour(s)).   PHQ2/9:    09/29/2023   10:48 AM 08/27/2023   11:24 AM 06/11/2023    1:33 PM 04/21/2023   12:00 PM 03/24/2023   10:53 AM  Depression screen PHQ 2/9  Decreased Interest 0 0 0 0 0  Down, Depressed, Hopeless 0 0 0 1 1  PHQ - 2 Score 0 0 0 1 1  Altered sleeping 0 0  1 1  Tired, decreased energy 0 0  0 0  Change in appetite 0 0  0 0  Feeling bad or failure about yourself  0 0  0 0  Trouble concentrating 0 0  0 0  Moving slowly or fidgety/restless 0 0  0 0  Suicidal thoughts 0 0  0 0  PHQ-9 Score 0 0  2 2  Difficult doing work/chores Not difficult at all Not difficult at all Not difficult at all Not  difficult at all Somewhat difficult      Fall Risk:    09/29/2023   10:48 AM  08/27/2023   11:24 AM 06/11/2023    1:25 PM 04/21/2023   12:00 PM 03/24/2023   10:48 AM  Fall Risk   Falls in the past year? 0 0 0 0 0  Number falls in past yr: 0 0 0  0  Injury with Fall? 0 0 0  0  Risk for fall due to : No Fall Risks No Fall Risks No Fall Risks No Fall Risks No Fall Risks  Follow up Falls prevention discussed;Education provided;Falls evaluation completed Falls prevention discussed;Education provided;Falls evaluation completed Education provided;Falls prevention discussed Falls prevention discussed;Education provided Falls prevention discussed;Education provided;Falls evaluation completed      Functional Status Survey: Is the patient deaf or have difficulty hearing?: No Does the patient have difficulty seeing, even when wearing glasses/contacts?: No Does the patient have difficulty concentrating, remembering, or making decisions?: No Does the patient have difficulty walking or climbing stairs?: Yes Does the patient have difficulty dressing or bathing?: No Does the patient have difficulty doing errands alone such as visiting a doctor's office or shopping?: No    Assessment & Plan  Problem List Items Addressed This Visit       Cardiovascular and Mediastinum   Essential hypertension - Primary    Chronic, historic condition, currently exacerbated Patient has been taking amlodipine 5 mg p.o. daily along with metoprolol 50 mg p.o. daily Blood pressures appear to have improved since adding amlodipine but they are still not within goal in office She does not check her blood pressures at home We will try weaning her off of metoprolol-take half tablet for 1 week then half tablet every other day for 1 week prior to stopping completely Will try adding valsartan 40 mg p.o. daily for further BP control as well as kidney protection Medication changes were reviewed extensively and provided on after  visit summary Follow-up in 4 weeks or sooner if concerns arise      Relevant Medications   amLODipine (NORVASC) 5 MG tablet   valsartan (DIOVAN) 40 MG tablet   metoprolol succinate (TOPROL-XL) 50 MG 24 hr tablet     Return in about 4 weeks (around 10/27/2023) for HTN.   I, Terree Gaultney E Cheng Dec, PA-C, have reviewed all documentation for this visit. The documentation on 09/29/23 for the exam, diagnosis, procedures, and orders are all accurate and complete.   Jacquelin Hawking, MHS, PA-C [facility]

## 2023-09-29 NOTE — Telephone Encounter (Signed)
Message from [place] S sent at 09/29/2023 12:40 PM EST  Summary: Prescription clarification?   Kaityln with [facility] called and needs clarification on a prescription. The prescription is Disp Refills Start End amLODipine (NORVASC) 5 MG tablet 90 tablet 1 09/29/2023 - Sig - Route: Take 1 tablet (5 mg total) by mouth daily. TAKE 1 TABLET(2.5 MG) BY MOUTH IN THE MORNING - Oral   Please assist patient further          Call History  Contact Date/Time Type Contact Phone/Fax User  09/29/2023 12:37 PM EST Phone (Incoming) Sigel with [facility] [phone] Kandis Cocking   Reason for Disposition  [1] Pharmacy calling with prescription question AND [2] triager unable to answer question  Answer Assessment - Initial Assessment Questions 1. NAME of MEDICINE: "What medicine(s) are you calling about?"     Amlodipine 5 mg 2. QUESTION: "What is your question?" (e.g., double dose of medicine, side effect)     Kaityln with [facility] needing clarification 3. PRESCRIBER: "Who prescribed the medicine?" Reason: if prescribed by specialist, call should be referred to that group.      4. SYMPTOMS: "Do you have any symptoms?" If Yes, ask: "What symptoms are you having?"  "How bad are the symptoms (e.g., mild, moderate, severe)     N/A 5. PREGNANCY:  "Is there any chance that you are pregnant?" "When was your last menstrual period?"     N/A  Protocols used: Medication Question Call-A-AH

## 2023-09-29 NOTE — Assessment & Plan Note (Signed)
Chronic, historic condition, currently exacerbated Patient has been taking amlodipine 5 mg p.o. daily along with metoprolol 50 mg p.o. daily Blood pressures appear to have improved since adding amlodipine but they are still not within goal in office She does not check her blood pressures at home We will try weaning her off of metoprolol-take half tablet for 1 week then half tablet every other day for 1 week prior to stopping completely Will try adding valsartan 40 mg p.o. daily for further BP control as well as kidney protection Medication changes were reviewed extensively and provided on after visit summary Follow-up in 4 weeks or sooner if concerns arise

## 2023-09-29 NOTE — Telephone Encounter (Signed)
She should be taking Amlodipine 5 mg PO every day

## 2023-09-29 NOTE — Patient Instructions (Addendum)
Please take a half tablet of the Metoprolol 50 mg ( to equal 25 mg) once per day for one week  After this, please take a half tablet every other day for one week then stop taking it completely  Please continue taking your other medications as directed I am starting you on a new blood pressure medication called Valsartan 40 mg to be taken by mouth once per day (start this after you pick it up from the pharmacy)  Please start this and take as directed unless you develop low blood pressure, dizziness, chest pain, palpitations, or an allergic reaction Please call us if you have questions or concerns.

## 2023-09-29 NOTE — Telephone Encounter (Signed)
Called pharmacy back and spoke to Abby, verbalized 5mg  everyday.

## 2023-09-30 NOTE — Telephone Encounter (Signed)
Pt would like to know out of her 3 medicines for HTN. How would you like for her to take them all off them at the same time or some in the evening?

## 2023-09-30 NOTE — Telephone Encounter (Signed)
Copied from CRM [id number]. Topic: General - Other >> Sep 30, 2023  8:13 AM Everette C wrote: Reason for CRM: The patient would like to speak with a member of clinical staff when possible  The patient shares that their medications were recently changed at their latest visit on 09/29/23 and they would like to review prior to taking them   Please contact the patient further when possible

## 2023-10-01 ENCOUNTER — Telehealth: Payer: Self-pay | Admitting: Family Medicine

## 2023-10-01 NOTE — Telephone Encounter (Signed)
Copied from CRM [id number]. Topic: General - Other >> Sep 30, 2023  4:55 PM Everette C wrote: Reason for CRM: The patient would like to be contacted by [facility]. Mecum directly when possible to review the instructions for their valsartan (DIOVAN) 40 MG tablet [[id number]] prescription   Please contact further when possible

## 2023-10-01 NOTE — Telephone Encounter (Signed)
Different telephone encounter sent yesterday to you. Pt wants to know specifically from you how to take her bp meds in the morning or evening all at once. Only take some in the morning or evening. Please advice

## 2023-10-02 NOTE — Telephone Encounter (Signed)
Pt notified verbalized understanding. Pt stated she has been taking Amlodipine and metoprolol in the morning and valsartan in the evening. I advised to continue the pattern as long as she is consistent and compliant daily

## 2023-10-02 NOTE — Telephone Encounter (Signed)
Called pt no answer left vm to call back office. ?

## 2023-10-27 ENCOUNTER — Ambulatory Visit (INDEPENDENT_AMBULATORY_CARE_PROVIDER_SITE_OTHER): Payer: PPO | Admitting: Physician Assistant

## 2023-10-27 VITALS — BP 158/80 | HR 114 | Resp 16 | Ht 65.0 in | Wt 131.0 lb

## 2023-10-27 DIAGNOSIS — I10 Essential (primary) hypertension: Secondary | ICD-10-CM | POA: Diagnosis not present

## 2023-10-27 MED ORDER — AMLODIPINE BESYLATE 10 MG PO TABS: 10.0000 mg | ORAL_TABLET | Freq: Every day | ORAL | 1 refills | Status: DC

## 2023-10-27 NOTE — Assessment & Plan Note (Addendum)
Chronic, historic condition, currently exacerbated Patient has been taking amlodipine 5 mg p.o. daily, Valsartan 40 mg PO every day She has stopped taking metoprolol as directed at previous visit Minimal improvement with BP as measured in office today. She is tachycardic today in office- may need to resume beta blocker if still elevated at next apt  Will try increasing amlodipine to 10 mg PO every day today and continue Valsartan 40 mg PO every day  Follow up in 4 weeks or sooner if concerns arise

## 2023-10-27 NOTE — Progress Notes (Signed)
Established Patient Office Visit  Name: Laura Deleon   MRN: [id number]    DOB: 1946-05-26   Date:10/27/2023  Today's Provider: Jacquelin Hawking, MHS, PA-C Introduced myself to the patient as a PA-C and provided education on APPs in clinical practice.         Subjective  Chief Complaint  Chief Complaint  Patient presents with   Hypertension    4 weeks follow-up    HPI  Hypertension: - Medications: Valsartan 40 mg PO QD, Amlodipine 5 mg PO QD - Compliance: good compliance  - Checking BP at home: Not checking at home  - Denies any SOB, CP, vision changes, LE edema, medication SEs, or symptoms of hypotension    Patient Active Problem List   Diagnosis Date Noted   Age-related osteoporosis without current pathological fracture 08/01/2022   Anxiety 07/11/2021   Underweight 03/26/2021   Hip fracture due to osteoporosis with routine healing 03/26/2021   Hx of transient ischemic attack (TIA) 02/17/2019   Peripheral neuropathy 08/17/2018   Calcification of abdominal aorta (HCC) 12/16/2016   Unintentional weight loss 08/19/2016   Advance care planning 08/19/2016   Allergic rhinitis, seasonal 07/19/2015   Essential hypertension 07/19/2015   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   Hyperlipidemia LDL goal <70 07/19/2015    Past Surgical History:  Procedure Laterality Date   ABDOMINAL HYSTERECTOMY     BLADDER SURGERY     HAND SURGERY  11/14/2016   removal of foreign body   HERNIA REPAIR     with mesh   HIP ARTHROPLASTY Left 07/04/2020   Procedure: ARTHROPLASTY BIPOLAR HIP (HEMIARTHROPLASTY);  Surgeon: Signa Kell, MD;  Location: [facility] OR[facility];  Service: Orthopedics;  Laterality: Left;    Family History  Problem Relation Age of Onset   Hyperlipidemia Mother    Hypertension Mother    Hyperlipidemia Father    Emphysema Father    COPD Father    Breast cancer Neg Hx     Social History   Tobacco Use   Smoking status: Never   Smokeless tobacco: Never   Tobacco comments:     smoking cessation materials not required  Substance Use Topics   Alcohol use: No    Alcohol/week: 0.0 standard drinks of alcohol     Current Outpatient Medications:    amLODipine (NORVASC) 10 MG tablet, Take 1 tablet (10 mg total) by mouth daily., Disp: 60 tablet, Rfl: 1   atorvastatin (LIPITOR) 10 MG tablet, TAKE 1 TABLET(10 MG) BY MOUTH DAILY at bedtime, Disp: 90 tablet, Rfl: 3   ezetimibe (ZETIA) 10 MG tablet, TAKE 1 TABLET(10 MG) BY MOUTH DAILY, Disp: 90 tablet, Rfl: 3   Multiple Vitamin (MULTI-VITAMINS) TABS, Take 1 tablet by mouth daily., Disp: , Rfl:    triamcinolone ointment (KENALOG) 0.1 %, Apply 1 Application topically 2 (two) times daily as needed (area of raised red rash/itchy)., Disp: 30 g, Rfl: 0   valsartan (DIOVAN) 40 MG tablet, Take 1 tablet (40 mg total) by mouth daily., Disp: 60 tablet, Rfl: 0   metoprolol succinate (TOPROL-XL) 50 MG 24 hr tablet, Take 0.5 tablets (25 mg total) by mouth daily. For one week then take one half tablet every other day for one week. Stop after the second week (Patient not taking: Reported on 10/27/2023), Disp: , Rfl:   Allergies  Allergen Reactions   Sulfa Antibiotics Shortness Of Breath   Latex     I personally reviewed active problem list, medication list, allergies,  notes from last encounter with the patient/caregiver today.   Review of Systems  Eyes:  Negative for blurred vision and double vision.  Respiratory:  Negative for shortness of breath and wheezing.   Cardiovascular:  Negative for chest pain, palpitations and leg swelling.  Neurological:  Negative for dizziness and headaches.      Objective  Vitals:   10/27/23 1245  BP: (!) 158/80  Pulse: (!) 114  Resp: 16  SpO2: 98%  Weight: 131 lb (59.4 kg)  Height: 5\' 5"  (1.651 m)    Body mass index is 21.8 kg/m.  Physical Exam Vitals reviewed.  Constitutional:      General: She is awake.     Appearance: Normal appearance. She is well-developed and well-groomed.   HENT:     Head: Normocephalic and atraumatic.  Cardiovascular:     Rate and Rhythm: Regular rhythm. Tachycardia present.     Pulses: Normal pulses.          Radial pulses are 2+ on the right side and 2+ on the left side.     Heart sounds: Normal heart sounds. No murmur heard.    No friction rub. No gallop.  Pulmonary:     Effort: Pulmonary effort is normal.     Breath sounds: Normal breath sounds. No decreased air movement. No decreased breath sounds, wheezing, rhonchi or rales.  Musculoskeletal:     Cervical back: Normal range of motion.     Right lower leg: No edema.     Left lower leg: No edema.  Neurological:     General: No focal deficit present.     Mental Status: She is alert and oriented to person, place, and time. Mental status is at baseline.     GCS: GCS eye subscore is 4. GCS verbal subscore is 5. GCS motor subscore is 6.  Psychiatric:        Attention and Perception: Attention and perception normal.        Mood and Affect: Mood and affect normal.        Speech: Speech normal.        Behavior: Behavior normal. Behavior is cooperative.        Thought Content: Thought content normal.        Cognition and Memory: Cognition normal.      No results found for this or any previous visit (from the past 2160 hours).   PHQ2/9:    09/29/2023   10:48 AM 08/27/2023   11:24 AM 06/11/2023    1:33 PM 04/21/2023   12:00 PM 03/24/2023   10:53 AM  Depression screen PHQ 2/9  Decreased Interest 0 0 0 0 0  Down, Depressed, Hopeless 0 0 0 1 1  PHQ - 2 Score 0 0 0 1 1  Altered sleeping 0 0  1 1  Tired, decreased energy 0 0  0 0  Change in appetite 0 0  0 0  Feeling bad or failure about yourself  0 0  0 0  Trouble concentrating 0 0  0 0  Moving slowly or fidgety/restless 0 0  0 0  Suicidal thoughts 0 0  0 0  PHQ-9 Score 0 0  2 2  Difficult doing work/chores Not difficult at all Not difficult at all Not difficult at all Not difficult at all Somewhat difficult      Fall  Risk:    09/29/2023   10:48 AM 08/27/2023   11:24 AM 06/11/2023    1:25 PM 04/21/2023  12:00 PM 03/24/2023   10:48 AM  Fall Risk   Falls in the past year? 0 0 0 0 0  Number falls in past yr: 0 0 0  0  Injury with Fall? 0 0 0  0  Risk for fall due to : No Fall Risks No Fall Risks No Fall Risks No Fall Risks No Fall Risks  Follow up Falls prevention discussed;Education provided;Falls evaluation completed Falls prevention discussed;Education provided;Falls evaluation completed Education provided;Falls prevention discussed Falls prevention discussed;Education provided Falls prevention discussed;Education provided;Falls evaluation completed      Functional Status Survey:      Assessment & Plan  Problem List Items Addressed This Visit       Cardiovascular and Mediastinum   Essential hypertension - Primary   Chronic, historic condition, currently exacerbated Patient has been taking amlodipine 5 mg p.o. daily, Valsartan 40 mg PO every day She has stopped taking metoprolol as directed at previous visit Minimal improvement with BP as measured in office today. She is tachycardic today in office- may need to resume beta blocker if still elevated at next apt  Will try increasing amlodipine to 10 mg PO every day today and continue Valsartan 40 mg PO every day  Follow up in 4 weeks or sooner if concerns arise      Relevant Medications   amLODipine (NORVASC) 10 MG tablet     Return in about 4 weeks (around 11/24/2023) for HTN, HLD, Annual Physical.   I, Zaidin Blyden E Elidia Bonenfant, PA-C, have reviewed all documentation for this visit. The documentation on 10/27/23 for the exam, diagnosis, procedures, and orders are all accurate and complete.   Jacquelin Hawking, MHS, PA-C [facility]

## 2023-10-27 NOTE — Patient Instructions (Addendum)
Your blood pressure seems to be stable at this time but it is still above goal At this time I recommend that we increase your Amlodipine to 10 mg by mouth once per day  - I have sent in a script of the new dose. You can take two tablets of the 5 mg (to equal 10 mg total per day) until you use up your script and then you can start the 10 mg after that (only take one tablet of the 10 mg formulation)  Please continue to take the Valsartan 40 mg by mouth once per day as well

## 2023-11-23 ENCOUNTER — Other Ambulatory Visit: Payer: Self-pay | Admitting: Physician Assistant

## 2023-11-23 DIAGNOSIS — I10 Essential (primary) hypertension: Secondary | ICD-10-CM

## 2023-11-24 ENCOUNTER — Telehealth: Payer: Self-pay | Admitting: Family Medicine

## 2023-11-24 NOTE — Telephone Encounter (Signed)
 Medication Refill -  Most Recent Primary Care Visit:  Provider: MECUM, ERIN E  Department: [facility]  Visit Type: OFFICE VISIT  Date: 10/27/2023  Medication: valsartan  (DIOVAN ) 40 MG tablet   Has the patient contacted their pharmacy? Yes (Agent: If no, request that the patient contact the pharmacy for the refill. If patient does not wish to contact the pharmacy document the reason why and proceed with request.) (Agent: If yes, when and what did the pharmacy advise?)  Is this the correct pharmacy for this prescription? Yes If no, delete pharmacy and type the correct one.  This is the patient's preferred pharmacy:  [id number] - [place], [place] - [address] AT [facility] OF [address] [place] [address] [place] [address] Phone: [phone] Fax: [phone]   Has the prescription been filled recently? Yes  Is the patient out of the medication? Yes (one tab left)  Has the patient been seen for an appointment in the last year OR does the patient have an upcoming appointment? Yes  Can we respond through MyChart? No  Agent: Please be advised that Rx refills may take up to 3 business days. We ask that you follow-up with your pharmacy.

## 2023-11-24 NOTE — Telephone Encounter (Signed)
 Requested Prescriptions  Pending Prescriptions Disp Refills   valsartan  (DIOVAN ) 40 MG tablet [Pharmacy Med Name: VALSARTAN  40MG  TABLETS] 60 tablet 0    Sig: TAKE 1 TABLET(40 MG) BY MOUTH DAILY     Cardiovascular:  Angiotensin Receptor Blockers Failed - 11/24/2023  8:18 AM      Failed - Cr in normal range and within 180 days    Creat  Date Value Ref Range Status  04/21/2023 0.56 (L) 0.60 - 1.00 mg/dL Final         Failed - K in normal range and within 180 days    Potassium  Date Value Ref Range Status  04/21/2023 4.5 3.5 - 5.3 mmol/L Final         Failed - Last BP in normal range    BP Readings from Last 1 Encounters:  10/27/23 (!) 158/80         Passed - Patient is not pregnant      Passed - Valid encounter within last 6 months    Recent Outpatient Visits           4 weeks ago Essential hypertension   [facility] Mecum, Rocky BRAVO, PA-C   1 month ago Essential hypertension   [facility] Mecum, Rocky BRAVO, PA-C   2 months ago Essential hypertension   [facility] Mecum, Rocky BRAVO, PA-C   7 months ago Hyperlipidemia LDL goal <70   [facility] Leavy Mole, PA-C   8 months ago Edema of right lower leg   [facility] Leavy Mole, PA-C       Future Appointments             In 1 week Mecum, Erin E, PA-C [facility], [facility]

## 2023-12-01 ENCOUNTER — Encounter: Payer: PPO | Admitting: Physician Assistant

## 2023-12-01 DIAGNOSIS — I10 Essential (primary) hypertension: Secondary | ICD-10-CM

## 2023-12-01 DIAGNOSIS — Z0000 Encounter for general adult medical examination without abnormal findings: Secondary | ICD-10-CM

## 2023-12-01 DIAGNOSIS — I700 Atherosclerosis of aorta: Secondary | ICD-10-CM

## 2023-12-01 DIAGNOSIS — F419 Anxiety disorder, unspecified: Secondary | ICD-10-CM

## 2023-12-01 DIAGNOSIS — Z1231 Encounter for screening mammogram for malignant neoplasm of breast: Secondary | ICD-10-CM

## 2023-12-14 ENCOUNTER — Encounter: Payer: Self-pay | Admitting: Family Medicine

## 2023-12-14 ENCOUNTER — Ambulatory Visit (INDEPENDENT_AMBULATORY_CARE_PROVIDER_SITE_OTHER): Payer: PPO | Admitting: Family Medicine

## 2023-12-14 VITALS — BP 142/78 | HR 116 | Resp 16 | Ht 65.0 in | Wt 126.0 lb

## 2023-12-14 DIAGNOSIS — F419 Anxiety disorder, unspecified: Secondary | ICD-10-CM | POA: Diagnosis not present

## 2023-12-14 DIAGNOSIS — Z0001 Encounter for general adult medical examination with abnormal findings: Secondary | ICD-10-CM | POA: Diagnosis not present

## 2023-12-14 DIAGNOSIS — E785 Hyperlipidemia, unspecified: Secondary | ICD-10-CM

## 2023-12-14 DIAGNOSIS — I10 Essential (primary) hypertension: Secondary | ICD-10-CM

## 2023-12-14 DIAGNOSIS — K432 Incisional hernia without obstruction or gangrene: Secondary | ICD-10-CM | POA: Diagnosis not present

## 2023-12-14 DIAGNOSIS — Z0000 Encounter for general adult medical examination without abnormal findings: Secondary | ICD-10-CM | POA: Diagnosis not present

## 2023-12-14 DIAGNOSIS — M810 Age-related osteoporosis without current pathological fracture: Secondary | ICD-10-CM | POA: Diagnosis not present

## 2023-12-14 DIAGNOSIS — Z1231 Encounter for screening mammogram for malignant neoplasm of breast: Secondary | ICD-10-CM

## 2023-12-14 DIAGNOSIS — R000 Tachycardia, unspecified: Secondary | ICD-10-CM

## 2023-12-14 DIAGNOSIS — Z7189 Other specified counseling: Secondary | ICD-10-CM | POA: Diagnosis not present

## 2023-12-14 NOTE — Patient Instructions (Signed)
Health Maintenance  Topic Date Due   Mammogram  07/04/2023   Zoster (Shingles) Vaccine (1 of 2) 02/28/2024*   Medicare Annual Wellness Visit  06/10/2024   DEXA scan (bone density measurement)  07/03/2024   DTaP/Tdap/Td vaccine (4 - Td or Tdap) 08/08/2030   Pneumonia Vaccine  Completed   Flu Shot  Completed   Hepatitis C Screening  Completed   HPV Vaccine  Aged Out   Colon Cancer Screening  Discontinued   COVID-19 Vaccine  Discontinued  *Topic was postponed. The date shown is not the original due date.   [facility] [address], [place], [place] [address] Scheduling phone #: [phone]  Call to schedule mammogram whenever you are able to  In August this year your bone density will be due as well.

## 2023-12-14 NOTE — Progress Notes (Unsigned)
Patient: Laura Deleon, Female    DOB: 1946-10-04, 78 y.o.   MRN: [id number] Danelle Berry, PA-C Visit Date: 12/14/2023  Today's Provider: Danelle Berry, PA-C   Chief Complaint  Patient presents with  . Annual Exam   Subjective:   Annual physical exam:  Laura Deleon is a 78 y.o. female who presents today for complete physical exam:  Exercise/Activity:  limited activity with ortho/hip, but walks almost daily Diet/nutrition:   healthy, drinking boost has gained some weight Sleep:  she wakes up a lot, a lot of times having to pee   SDOH Screenings   Food Insecurity: No Food Insecurity (12/14/2023)  Housing: Unknown (12/14/2023)  Transportation Needs: No Transportation Needs (12/14/2023)  Utilities: Not At Risk (12/14/2023)  Alcohol Screen: Low Risk  (06/11/2023)  Depression (PHQ2-9): Low Risk  (12/14/2023)  Financial Resource Strain: Low Risk  (12/14/2023)  Physical Activity: Sufficiently Active (12/14/2023)  Social Connections: Moderately Isolated (12/14/2023)  Stress: No Stress Concern Present (12/14/2023)  Tobacco Use: Low Risk  (12/14/2023)  Health Literacy: Adequate Health Literacy (12/14/2023)    USPSTF grade A and B recommendations - reviewed and addressed today  Depression:  Phq 9 completed today by patient, was reviewed by me with patient in the room PHQ score is neg, pt feels feeling     12/14/2023    1:33 PM 09/29/2023   10:48 AM 08/27/2023   11:24 AM 06/11/2023    1:33 PM  PHQ 2/9 Scores  PHQ - 2 Score 0 0 0 0  PHQ- 9 Score 0 0 0       12/14/2023    1:33 PM 09/29/2023   10:48 AM 08/27/2023   11:24 AM 06/11/2023    1:33 PM 04/21/2023   12:00 PM  Depression screen PHQ 2/9  Decreased Interest 0 0 0 0 0  Down, Depressed, Hopeless 0 0 0 0 1  PHQ - 2 Score 0 0 0 0 1  Altered sleeping 0 0 0  1  Tired, decreased energy 0 0 0  0  Change in appetite 0 0 0  0  Feeling bad or failure about yourself  0 0 0  0  Trouble concentrating 0 0 0  0  Moving slowly or fidgety/restless  0 0 0  0  Suicidal thoughts 0 0 0  0  PHQ-9 Score 0 0 0  2  Difficult doing work/chores  Not difficult at all Not difficult at all Not difficult at all Not difficult at all    Alcohol screening: Flowsheet Row Clinical Support from 06/11/2023 in [facility]  AUDIT-C Score 0       Immunizations and Health Maintenance: Health Maintenance  Topic Date Due  . MAMMOGRAM  07/04/2023  . Zoster Vaccines- Shingrix (1 of 2) 02/28/2024 (Originally 07/31/1965)  . Medicare Annual Wellness (AWV)  06/10/2024  . DEXA SCAN  07/03/2024  . DTaP/Tdap/Td (4 - Td or Tdap) 08/08/2030  . Pneumonia Vaccine 35+ Years old  Completed  . INFLUENZA VACCINE  Completed  . Hepatitis C Screening  Completed  . HPV VACCINES  Aged Out  . Colonoscopy  Discontinued  . COVID-19 Vaccine  Discontinued     Hep C Screening: done  STD testing and prevention (HIV/chl/gon/syphilis):  see above, no additional testing desired by pt today, none needed  Intimate partner violence: safe, no abuse   Sexual History/Pain during Intercourse: Widowed  Menstrual History/LMP/Abnormal Bleeding:  none  No LMP recorded. Patient has had a  hysterectomy.  Incontinence Symptoms: stable incontinence, hx of bladder surgery /urge incontinence  Breast cancer:   mammogram due  Last Mammogram: *see HM list above   Osteoporosis:    Discussion on osteoporosis per age, including high calcium and vitamin D supplementation, weight bearing exercises Pt is supplementing with daily calcium/Vit D. 06/2022  Bone scan/dexa   Skin cancer:  Hx of skin CA -  NO Discussed atypical lesions   Colorectal cancer:   Colonoscopy is - discontinued by GI Discussed concerning signs and sx of CRC, pt denies bowel changes, blood in stool  Lung cancer:   Low Dose CT Chest recommended if Age 51-80 years, 20 pack-year currently smoking OR have quit w/in 15years. Patient does not qualify.    Social History   Tobacco Use  . Smoking  status: Never  . Smokeless tobacco: Never  . Tobacco comments:    smoking cessation materials not required  Vaping Use  . Vaping status: Never Used  Substance Use Topics  . Alcohol use: No    Alcohol/week: 0.0 standard drinks of alcohol  . Drug use: No     Flowsheet Row Clinical Support from 06/11/2023 in [facility]  AUDIT-C Score 0       Family History  Problem Relation Age of Onset  . Hyperlipidemia Mother   . Hypertension Mother   . Hyperlipidemia Father   . Emphysema Father   . COPD Father   . Breast cancer Neg Hx      Blood pressure/Hypertension: BP Readings from Last 3 Encounters:  12/14/23 (!) 142/78  10/27/23 (!) 158/80  09/29/23 (!) 148/84    Weight/Obesity: Wt Readings from Last 3 Encounters:  12/14/23 126 lb (57.2 kg)  10/27/23 131 lb (59.4 kg)  09/29/23 131 lb 1.6 oz (59.5 kg)   BMI Readings from Last 3 Encounters:  12/14/23 20.97 kg/m  10/27/23 21.80 kg/m  09/29/23 21.82 kg/m     Lipids:  Lab Results  Component Value Date   CHOL 135 04/21/2023   CHOL 142 05/01/2022   CHOL 189 11/07/2021   Lab Results  Component Value Date   HDL 58 04/21/2023   HDL 58 05/01/2022   HDL 62 11/07/2021   Lab Results  Component Value Date   LDLCALC 62 04/21/2023   LDLCALC 63 05/01/2022   LDLCALC 112 (H) 11/07/2021   Lab Results  Component Value Date   TRIG 69 04/21/2023   TRIG 127 05/01/2022   TRIG 68 11/07/2021   Lab Results  Component Value Date   CHOLHDL 2.3 04/21/2023   CHOLHDL 2.4 05/01/2022   CHOLHDL 3.0 11/07/2021   No results found for: "LDLDIRECT" Based on the results of lipid panel his/her cardiovascular risk factor ( using Poole Cohort )  in the next 10 years is: The 10-year ASCVD risk score (Arnett DK, et al., 2019) is: 27.4%   Values used to calculate the score:     Age: 76 years     Sex: Female     Is Non-Hispanic African American: No     Diabetic: No     Tobacco smoker: No     Systolic Blood  Pressure: 142 mmHg     Is BP treated: Yes     HDL Cholesterol: 58 mg/dL     Total Cholesterol: 135 mg/dL  Glucose:  Glucose, Bld  Date Value Ref Range Status  04/21/2023 90 65 - 99 mg/dL Final    Comment:    .  Fasting reference interval .   03/24/2023 106 (H) 65 - 99 mg/dL Final    Comment:    .            Fasting reference interval . For someone without known diabetes, a glucose value between 100 and 125 mg/dL is consistent with prediabetes and should be confirmed with a follow-up test. .   05/01/2022 109 (H) 65 - 99 mg/dL Final    Comment:    .            Fasting reference interval . For someone without known diabetes, a glucose value between 100 and 125 mg/dL is consistent with prediabetes and should be confirmed with a follow-up test. .    Glucose-Capillary  Date Value Ref Range Status  07/09/2020 168 (H) 70 - 99 mg/dL Final    Comment:    Glucose reference range applies only to samples taken after fasting for at least 8 hours.  07/09/2020 99 70 - 99 mg/dL Final    Comment:    Glucose reference range applies only to samples taken after fasting for at least 8 hours.  07/08/2020 139 (H) 70 - 99 mg/dL Final    Comment:    Glucose reference range applies only to samples taken after fasting for at least 8 hours.    Advanced Care Planning:  A voluntary discussion about advance care planning including the explanation and discussion of advance directives.   Discussed health care proxy and Living will, and the patient was able to identify a health care proxy as not decided - daughter and son both would disagree.    Patient does not have a living will at present time.   Social History       Social History   Socioeconomic History  . Marital status: Widowed    Spouse name: Faylene Million  . Number of children: 2  . Years of education: Not on file  . Highest education level: 12th grade  Occupational History  . Occupation: Retired  Tobacco Use  . Smoking  status: Never  . Smokeless tobacco: Never  . Tobacco comments:    smoking cessation materials not required  Vaping Use  . Vaping status: Never Used  Substance and Sexual Activity  . Alcohol use: No    Alcohol/week: 0.0 standard drinks of alcohol  . Drug use: No  . Sexual activity: Not Currently  Other Topics Concern  . Not on file  Social History Narrative   Husband passed away 01-02-16, she is raising her 30 year old great grandson   Social Drivers of Corporate investment banker Strain: Low Risk  (12/14/2023)   Overall Financial Resource Strain (CARDIA)   . Difficulty of Paying Living Expenses: Not hard at all  Food Insecurity: No Food Insecurity (12/14/2023)   Hunger Vital Sign   . Worried About Programme researcher, broadcasting/film/video in the Last Year: Never true   . Ran Out of Food in the Last Year: Never true  Transportation Needs: No Transportation Needs (12/14/2023)   PRAPARE - Transportation   . Lack of Transportation (Medical): No   . Lack of Transportation (Non-Medical): No  Physical Activity: Sufficiently Active (12/14/2023)   Exercise Vital Sign   . Days of Exercise per Week: 7 days   . Minutes of Exercise per Session: 30 min  Stress: No Stress Concern Present (12/14/2023)   [employer] of Occupational Health - Occupational Stress Questionnaire   . Feeling of Stress : Only a little  Social  Connections: Moderately Isolated (12/14/2023)   Social Connection and Isolation Panel [NHANES]   . Frequency of Communication with Friends and Family: Once a week   . Frequency of Social Gatherings with Friends and Family: More than three times a week   . Attends Religious Services: More than 4 times per year   . Active Member of Clubs or Organizations: No   . Attends Banker Meetings: Never   . Marital Status: Widowed    Family History        Family History  Problem Relation Age of Onset  . Hyperlipidemia Mother   . Hypertension Mother   . Hyperlipidemia Father   . Emphysema Father    . COPD Father   . Breast cancer Neg Hx     Patient Active Problem List   Diagnosis Date Noted  . Age-related osteoporosis without current pathological fracture 08/01/2022  . Anxiety 07/11/2021  . Underweight 03/26/2021  . Hip fracture due to osteoporosis with routine healing 03/26/2021  . Hx of transient ischemic attack (TIA) 02/17/2019  . Peripheral neuropathy 08/17/2018  . Calcification of abdominal aorta (HCC) 12/16/2016  . Unintentional weight loss 08/19/2016  . Advance care planning 08/19/2016  . Allergic rhinitis, seasonal 07/19/2015  . Essential hypertension 07/19/2015  . 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  . Hyperlipidemia LDL goal <70 07/19/2015    Past Surgical History:  Procedure Laterality Date  . ABDOMINAL HYSTERECTOMY    . BLADDER SURGERY    . HAND SURGERY  11/14/2016   removal of foreign body  . HERNIA REPAIR     with mesh  . HIP ARTHROPLASTY Left 07/04/2020   Procedure: ARTHROPLASTY BIPOLAR HIP (HEMIARTHROPLASTY);  Surgeon: Signa Kell, MD;  Location: [facility] ORS;  Service: Orthopedics;  Laterality: Left;     Current Outpatient Medications:  .  atorvastatin (LIPITOR) 10 MG tablet, TAKE 1 TABLET(10 MG) BY MOUTH DAILY at bedtime, Disp: 90 tablet, Rfl: 3 .  cholecalciferol (VITAMIN D3) 25 MCG (1000 UNIT) tablet, Take 1,000 Units by mouth daily., Disp: , Rfl:  .  ezetimibe (ZETIA) 10 MG tablet, TAKE 1 TABLET(10 MG) BY MOUTH DAILY, Disp: 90 tablet, Rfl: 3 .  Multiple Vitamin (MULTI-VITAMINS) TABS, Take 1 tablet by mouth daily., Disp: , Rfl:  .  triamcinolone ointment (KENALOG) 0.1 %, Apply 1 Application topically 2 (two) times daily as needed (area of raised red rash/itchy)., Disp: 30 g, Rfl: 0 .  valsartan (DIOVAN) 40 MG tablet, TAKE 1 TABLET(40 MG) BY MOUTH DAILY, Disp: 30 tablet, Rfl: 0 .  amLODipine (NORVASC) 10 MG tablet, Take 1 tablet (10 mg total) by mouth daily. (Patient not taking: Reported on 12/14/2023), Disp: 60 tablet, Rfl: 1 .  metoprolol succinate  (TOPROL-XL) 50 MG 24 hr tablet, Take 0.5 tablets (25 mg total) by mouth daily. For one week then take one half tablet every other day for one week. Stop after the second week (Patient not taking: Reported on 12/14/2023), Disp: , Rfl:   Allergies  Allergen Reactions  . Sulfa Antibiotics Shortness Of Breath  . Latex     Patient Care Team: Danelle Berry, PA-C as PCP - General (Family Medicine) Pa, [facility])   Chart Review: I personally reviewed active problem list, medication list, allergies, family history, social history, health maintenance, notes from last encounter, lab results, imaging with the patient/caregiver today.   Review of Systems  Constitutional: Negative.   HENT: Negative.    Eyes: Negative.   Respiratory: Negative.    Cardiovascular: Negative.  Gastrointestinal: Negative.   Endocrine: Negative.   Genitourinary: Negative.   Musculoskeletal: Negative.   Skin: Negative.   Allergic/Immunologic: Negative.   Neurological: Negative.   Hematological: Negative.   Psychiatric/Behavioral: Negative.    All other systems reviewed and are negative.         Objective:   Vitals:  Vitals:   12/14/23 1329 12/14/23 1340  BP: (!) 142/86 (!) 142/78  Pulse: (!) 102   Resp: 16   SpO2: 96%   Weight: 126 lb (57.2 kg)   Height: 5\' 5"  (1.651 m)     Body mass index is 20.97 kg/m.  Physical Exam Vitals and nursing note reviewed.  Constitutional:      General: She is not in acute distress.    Appearance: Normal appearance. She is well-developed. She is not ill-appearing, toxic-appearing or diaphoretic.  HENT:     Head: Normocephalic and atraumatic.     Right Ear: External ear normal.     Left Ear: External ear normal.     Nose: Nose normal.  Eyes:     General:        Right eye: No discharge.        Left eye: No discharge.     Conjunctiva/sclera: Conjunctivae normal.  Neck:     Trachea: No tracheal deviation.  Cardiovascular:     Rate and Rhythm:  Regular rhythm. Tachycardia present.     Pulses: Normal pulses.     Heart sounds: Normal heart sounds. No murmur heard.    No friction rub. No gallop.  Pulmonary:     Effort: Pulmonary effort is normal. No respiratory distress.     Breath sounds: Normal breath sounds. No stridor. No wheezing, rhonchi or rales.  Musculoskeletal:        General: Normal range of motion.  Skin:    General: Skin is warm and dry.     Findings: No rash.  Neurological:     Mental Status: She is alert.     Motor: No abnormal muscle tone.     Coordination: Coordination normal.  Psychiatric:        Attention and Perception: Attention and perception normal.        Mood and Affect: Mood is anxious.        Speech: Speech normal.        Behavior: Behavior is hyperactive. Behavior is cooperative.     Comments: Somewhat anxious, hyperactive, very talkative appearing      Fall Risk:    12/14/2023    1:33 PM 09/29/2023   10:48 AM 08/27/2023   11:24 AM 06/11/2023    1:25 PM 04/21/2023   12:00 PM  Fall Risk   Falls in the past year? 0 0 0 0 0  Number falls in past yr: 0 0 0 0   Injury with Fall? 0 0 0 0   Risk for fall due to : No Fall Risks No Fall Risks No Fall Risks No Fall Risks No Fall Risks  Follow up Falls prevention discussed Falls prevention discussed;Education provided;Falls evaluation completed Falls prevention discussed;Education provided;Falls evaluation completed Education provided;Falls prevention discussed Falls prevention discussed;Education provided    Functional Status Survey:     Assessment & Plan:    CPE completed today  USPSTF grade A and B recommendations reviewed with patient; age-appropriate recommendations, preventive care, screening tests, etc discussed and encouraged; healthy living encouraged; see AVS for patient education given to patient  Discussed importance of 150 minutes of physical activity weekly, AHA  exercise recommendations given to pt in AVS/handout  Discussed  importance of healthy diet:  eating lean meats and proteins, avoiding trans fats and saturated fats, avoid simple sugars and excessive carbs in diet, eat 6 servings of fruit/vegetables daily and drink plenty of water and avoid sweet beverages.    Recommended pt to do annual eye exam and routine dental exams/cleanings  Depression, alcohol, fall screening completed as documented above and per flowsheets  Advance Care planning information and packet discussed and offered today, encouraged pt to discuss with family members/spouse/partner/friends and complete Advanced directive packet and bring copy to office   Reviewed Health Maintenance: Health Maintenance  Topic Date Due  . MAMMOGRAM  07/04/2023  . Zoster Vaccines- Shingrix (1 of 2) 02/28/2024 (Originally 07/31/1965)  . Medicare Annual Wellness (AWV)  06/10/2024  . DEXA SCAN  07/03/2024  . DTaP/Tdap/Td (4 - Td or Tdap) 08/08/2030  . Pneumonia Vaccine 61+ Years old  Completed  . INFLUENZA VACCINE  Completed  . Hepatitis C Screening  Completed  . HPV VACCINES  Aged Out  . Colonoscopy  Discontinued  . COVID-19 Vaccine  Discontinued    Immunizations: Immunization History  Administered Date(s) Administered  . Fluad Quad(high Dose 65+) 08/04/2019, 08/16/2020, 08/08/2021, 08/01/2022  . Fluad Trivalent(High Dose 65+) 08/27/2023  . Influenza, High Dose Seasonal PF 08/19/2016, 08/27/2017, 07/30/2018  . Influenza,inj,Quad PF,6+ Mos 07/19/2015  . Influenza-Unspecified 07/11/2014  . Pneumococcal Conjugate-13 08/01/2014  . Pneumococcal Polysaccharide-23 02/19/2017  . Tdap 11/23/2007, 11/14/2016, 08/08/2020   Vaccines:  HPV: up to at age 39 , ask insurance if age between 66-45  Shingrix: 57-64 yo and ask insurance if covered when patient above 37 yo Pneumonia:  educated and discussed with patient. Flu:  educated and discussed with patient. COVID:      ICD-10-CM   1. Adult general medical exam  Z00.00 Lipid Profile    CBC with  Differential/Platelet    COMPLETE METABOLIC PANEL WITH GFR    HgB A1c    2. Essential hypertension  I10 COMPLETE METABOLIC PANEL WITH GFR   mildly elevated, acceptable for pt with age and hx of anxiety/white coat htn, on amlodipine 10 and valsartan 40    3. Hyperlipidemia LDL goal <70  E78.5 Lipid Profile    COMPLETE METABOLIC PANEL WITH GFR   on statin and zetia and tolerating    4. Age-related osteoporosis without current pathological fracture  M81.0    due for f/up in aug 2025, on vit D and calcium supplement    5. Encounter for screening mammogram for malignant neoplasm of breast  Z12.31 MM 3D SCREENING MAMMOGRAM BILATERAL BREAST    6. Tachycardia  R00.0 TSH   she feels jittery and anxious today, HR still elevated when rechecked manually in room - 120's, w/o CP SOB, add on TSH, close f/up  Of note BB was discontinued a few months ago by other provider - in October - after that pt HR elevated - will likely restart her on metoprolol  Pulse Readings from Last 6 Encounters:  12/14/23 (!) 116  10/27/23 (!) 114  09/29/23 (!) 106  08/27/23 92  04/21/23 82  03/24/23 93         7. 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 Anxiety  F41.9    often causes some BP elevation, today BP mildly elevated and HR tachy    9. Advance care planning  Z71.89    extensive discussion today, more than 16 min, with reviewing ACP packet, will refer pt to CCM SW to help with ACP     Return for 1-2 week recheck tachycardia and hernia.      Danelle Berry, PA-C 12/14/23 1:45 PM  [facility]

## 2023-12-15 ENCOUNTER — Other Ambulatory Visit: Payer: Self-pay | Admitting: Family Medicine

## 2023-12-15 DIAGNOSIS — I10 Essential (primary) hypertension: Secondary | ICD-10-CM

## 2023-12-15 DIAGNOSIS — R000 Tachycardia, unspecified: Secondary | ICD-10-CM

## 2023-12-15 LAB — COMPLETE METABOLIC PANEL WITH GFR
AG Ratio: 1.7 (calc) (ref 1.0–2.5)
ALT: 20 U/L (ref 6–29)
AST: 22 U/L (ref 10–35)
Albumin: 5 g/dL (ref 3.6–5.1)
Alkaline phosphatase (APISO): 88 U/L (ref 37–153)
BUN/Creatinine Ratio: 33 (calc) — ABNORMAL HIGH (ref 6–22)
BUN: 19 mg/dL (ref 7–25)
CO2: 27 mmol/L (ref 20–32)
Calcium: 10.2 mg/dL (ref 8.6–10.4)
Chloride: 99 mmol/L (ref 98–110)
Creat: 0.58 mg/dL — ABNORMAL LOW (ref 0.60–1.00)
Globulin: 2.9 g/dL (ref 1.9–3.7)
Glucose, Bld: 128 mg/dL — ABNORMAL HIGH (ref 65–99)
Potassium: 4.5 mmol/L (ref 3.5–5.3)
Sodium: 137 mmol/L (ref 135–146)
Total Bilirubin: 0.4 mg/dL (ref 0.2–1.2)
Total Protein: 7.9 g/dL (ref 6.1–8.1)
eGFR: 94 mL/min/{1.73_m2} (ref >=60)

## 2023-12-15 LAB — CBC WITH DIFFERENTIAL/PLATELET
Absolute Lymphocytes: 2670 {cells}/uL (ref 850–3900)
Absolute Monocytes: 808 {cells}/uL (ref 200–950)
Basophils Absolute: 38 {cells}/uL (ref 0–200)
Basophils Relative: 0.4 %
Eosinophils Absolute: 114 {cells}/uL (ref 15–500)
Eosinophils Relative: 1.2 %
HCT: 44.8 % (ref 35.0–45.0)
Hemoglobin: 14.7 g/dL (ref 11.7–15.5)
MCH: 28.3 pg (ref 27.0–33.0)
MCHC: 32.8 g/dL (ref 32.0–36.0)
MCV: 86.3 fL (ref 80.0–100.0)
MPV: 9.6 fL (ref 7.5–12.5)
Monocytes Relative: 8.5 %
Neutro Abs: 5871 {cells}/uL (ref 1500–7800)
Neutrophils Relative %: 61.8 %
Platelets: 281 10*3/uL (ref 140–400)
RBC: 5.19 10*6/uL — ABNORMAL HIGH (ref 3.80–5.10)
RDW: 12.7 % (ref 11.0–15.0)
Total Lymphocyte: 28.1 %
WBC: 9.5 10*3/uL (ref 3.8–10.8)

## 2023-12-15 LAB — LIPID PANEL
Cholesterol: 137 mg/dL (ref <200)
HDL: 57 mg/dL (ref >=50)
LDL Cholesterol (Calc): 60 mg/dL
Non-HDL Cholesterol (Calc): 80 mg/dL (ref <130)
Total CHOL/HDL Ratio: 2.4 (calc) (ref <5.0)
Triglycerides: 117 mg/dL (ref <150)

## 2023-12-15 LAB — TSH: TSH: 1.55 m[IU]/L (ref 0.40–4.50)

## 2023-12-15 LAB — HEMOGLOBIN A1C
Hgb A1c MFr Bld: 5.9 %{Hb} — ABNORMAL HIGH (ref <5.7)
Mean Plasma Glucose: 123 mg/dL
eAG (mmol/L): 6.8 mmol/L

## 2023-12-15 MED ORDER — METOPROLOL SUCCINATE ER 50 MG PO TB24: 50.0000 mg | ORAL_TABLET | Freq: Every morning | ORAL | 1 refills | Status: DC

## 2023-12-18 ENCOUNTER — Encounter: Payer: Self-pay | Admitting: Family Medicine

## 2023-12-21 ENCOUNTER — Encounter: Payer: Self-pay | Admitting: Family Medicine

## 2023-12-21 ENCOUNTER — Ambulatory Visit: Payer: PPO | Admitting: Family Medicine

## 2023-12-21 VITALS — BP 148/76 | HR 100 | Resp 16 | Ht 65.0 in | Wt 126.0 lb

## 2023-12-21 DIAGNOSIS — K432 Incisional hernia without obstruction or gangrene: Secondary | ICD-10-CM | POA: Diagnosis not present

## 2023-12-21 DIAGNOSIS — R000 Tachycardia, unspecified: Secondary | ICD-10-CM | POA: Diagnosis not present

## 2023-12-21 DIAGNOSIS — Z91148 Patient's other noncompliance with medication regimen for other reason: Secondary | ICD-10-CM | POA: Diagnosis not present

## 2023-12-21 DIAGNOSIS — I10 Essential (primary) hypertension: Secondary | ICD-10-CM | POA: Diagnosis not present

## 2023-12-21 MED ORDER — VALSARTAN 40 MG PO TABS: 40.0000 mg | ORAL_TABLET | Freq: Every morning | ORAL | 1 refills | Status: DC

## 2023-12-21 MED ORDER — AMLODIPINE BESYLATE 10 MG PO TABS: 10.0000 mg | ORAL_TABLET | Freq: Every morning | ORAL | 1 refills | Status: DC

## 2023-12-21 NOTE — Patient Instructions (Signed)
 Continue to take the blood pressure and heart rate medications in the morning daily at the prescribed doses.  Valsartan  40 mg Amlodipine  10 mg Metoprolol  ER 50 mg

## 2023-12-21 NOTE — Progress Notes (Signed)
 Name: Laura Deleon   MRN: [id number]    DOB: 1946-04-09   Date:12/21/2023       Progress Note  Chief Complaint  Patient presents with   Tachycardia   Hernia     Subjective:   Laura Deleon is a 78 y.o. female, presents to clinic for routine follow up on chronic conditions  Here for recheck of HTN and pulse Medications changed, she restarted BB  BP Readings from Last 10 Encounters:  12/21/23 (!) 148/76  12/14/23 (!) 142/78  10/27/23 (!) 158/80  09/29/23 (!) 148/84  08/27/23 (!) 154/82  06/11/23 122/78  04/21/23 (!) 160/92  03/24/23 134/76  11/17/22 (!) 162/88  10/30/22 (!) 174/88   Pulse Readings from Last 10 Encounters:  12/21/23 100  12/14/23 (!) 116  10/27/23 (!) 114  09/29/23 (!) 106  08/27/23 92  04/21/23 82  03/24/23 93  11/17/22 87  10/30/22 82  08/01/22 97  Tachy since metoprolol  was d/c by other provider around October   Last OV 2/3:  Tachycardia  R00.0 TSH     she feels jittery and anxious today, HR still elevated when rechecked manually in room - 120's, w/o CP SOB, add on TSH, close f/up   Of note BB was discontinued a few months ago by other provider - in October - after that pt HR elevated - will likely restart her on metoprolol      Pulse Readings from Last 6 Encounters:  12/14/23 (!) 116  10/27/23 (!) 114  09/29/23 (!) 106  08/27/23 92  04/21/23 82  03/24/23 93     Essential hypertension  I10 COMPLETE METABOLIC PANEL WITH GFR     mildly elevated, acceptable for pt with age and hx of anxiety/white coat htn, on amlodipine  10 and valsartan  40    Pt was very confused about the different medication and adding the doses all up together and taking them at different times a day.  She was ressured that all the meds work [employer] and work together.  She can take all at the same time since almost all of them have long duration of action and half life  Wrote it all out on her paper   Recurrent ventral hernia - feels bigger slightly  tender - referred to general surgery   Current Outpatient Medications:    atorvastatin  (LIPITOR) 10 MG tablet, TAKE 1 TABLET(10 MG) BY MOUTH DAILY at bedtime, Disp: 90 tablet, Rfl: 3   cholecalciferol (VITAMIN D3) 25 MCG (1000 UNIT) tablet, Take 1,000 Units by mouth daily., Disp: , Rfl:    ezetimibe  (ZETIA ) 10 MG tablet, TAKE 1 TABLET(10 MG) BY MOUTH DAILY, Disp: 90 tablet, Rfl: 3   metoprolol  succinate (TOPROL -XL) 50 MG 24 hr tablet, Take 1 tablet (50 mg total) by mouth in the morning. Take with or immediately following a meal., Disp: 90 tablet, Rfl: 1   Multiple Vitamin (MULTI-VITAMINS) TABS, Take 1 tablet by mouth daily., Disp: , Rfl:    triamcinolone  ointment (KENALOG ) 0.1 %, Apply 1 Application topically 2 (two) times daily as needed (area of raised red rash/itchy)., Disp: 30 g, Rfl: 0   amLODipine  (NORVASC ) 10 MG tablet, Take 1 tablet (10 mg total) by mouth in the morning., Disp: 90 tablet, Rfl: 1   valsartan  (DIOVAN ) 40 MG tablet, Take 1 tablet (40 mg total) by mouth in the morning., Disp: 90 tablet, Rfl: 1  Patient Active Problem List   Diagnosis Date Noted   Age-related osteoporosis without current pathological fracture 08/01/2022  Anxiety 07/11/2021   Hx of transient ischemic attack (TIA) 02/17/2019   Peripheral neuropathy 08/17/2018   Calcification of abdominal aorta (HCC) 12/16/2016   Advance care planning 08/19/2016   Allergic rhinitis, seasonal 07/19/2015   Essential hypertension 07/19/2015   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   Hyperlipidemia LDL goal <70 07/19/2015   Recurrent ventral hernia 06/23/2013    Past Surgical History:  Procedure Laterality Date   ABDOMINAL HYSTERECTOMY     BLADDER SURGERY     HAND SURGERY  11/14/2016   removal of foreign body   HERNIA REPAIR     with mesh   HIP ARTHROPLASTY Left 07/04/2020   Procedure: ARTHROPLASTY BIPOLAR HIP (HEMIARTHROPLASTY);  Surgeon: Lorri Rota, MD;  Location: [facility] OR[facility];  Service: Orthopedics;  Laterality: Left;     Family History  Problem Relation Age of Onset   Hyperlipidemia Mother    Hypertension Mother    Hyperlipidemia Father    Emphysema Father    COPD Father    Breast cancer Neg Hx     Social History   Tobacco Use   Smoking status: Never   Smokeless tobacco: Never   Tobacco comments:    smoking cessation materials not required  Vaping Use   Vaping status: Never Used  Substance Use Topics   Alcohol use: No    Alcohol/week: 0.0 standard drinks of alcohol   Drug use: No     Allergies  Allergen Reactions   Sulfa Antibiotics Shortness Of Breath   Latex     Health Maintenance  Topic Date Due   MAMMOGRAM  07/04/2023   Zoster Vaccines- Shingrix  (1 of 2) 02/28/2024 (Originally 07/31/1965)   Medicare Annual Wellness (AWV)  06/10/2024   DEXA SCAN  07/03/2024   DTaP/Tdap/Td (4 - Td or Tdap) 08/08/2030   Pneumonia Vaccine 18+ Years old  Completed   INFLUENZA VACCINE  Completed   Hepatitis C Screening  Completed   HPV VACCINES  Aged Out   Colonoscopy  Discontinued   COVID-19 Vaccine  Discontinued    Chart Review Today: I personally reviewed active problem list, medication list, allergies, family history, social history, health maintenance, notes from last encounter, lab results, imaging with the patient/caregiver today.   Review of Systems  Constitutional: Negative.   HENT: Negative.    Eyes: Negative.   Respiratory: Negative.    Cardiovascular: Negative.   Gastrointestinal: Negative.   Endocrine: Negative.   Genitourinary: Negative.   Musculoskeletal: Negative.   Skin: Negative.   Allergic/Immunologic: Negative.   Neurological: Negative.   Hematological: Negative.   Psychiatric/Behavioral: Negative.    All other systems reviewed and are negative.    Objective:   Vitals:   12/21/23 1322 12/21/23 1330 12/21/23 1340  BP: (!) 152/78 (!) 148/76   Pulse: (!) 107  100  Resp: 16    SpO2: 94%    Weight: 126 lb (57.2 kg)    Height: 5\' 5"  (1.651 m)      Body  mass index is 20.97 kg/m.  Physical Exam Vitals and nursing note reviewed.  Constitutional:      Appearance: Normal appearance. She is well-developed.  HENT:     Head: Normocephalic and atraumatic.     Nose: Nose normal.  Eyes:     General:        Right eye: No discharge.        Left eye: No discharge.     Conjunctiva/sclera: Conjunctivae normal.  Neck:     Trachea: No tracheal deviation.  Cardiovascular:  Rate and Rhythm: Regular rhythm. Tachycardia present.     Pulses: Normal pulses.     Heart sounds: Normal heart sounds. No murmur heard.    No friction rub. No gallop.  Pulmonary:     Effort: Pulmonary effort is normal. No respiratory distress.     Breath sounds: No stridor.  Musculoskeletal:        General: Normal range of motion.  Skin:    General: Skin is warm and dry.     Findings: No rash.  Neurological:     Mental Status: She is alert.     Motor: No abnormal muscle tone.     Coordination: Coordination normal.     Gait: Gait abnormal.  Psychiatric:        Behavior: Behavior normal.      Functional Status Survey:   Results for orders placed or performed in visit on 12/14/23  Lipid Profile   Collection Time: 12/14/23  2:17 PM  Result Value Ref Range   Cholesterol 137 <200 mg/dL   HDL 57 > OR = 50 mg/dL   Triglycerides 130 <865 mg/dL   LDL Cholesterol (Calc) 60 mg/dL (calc)   Total CHOL/HDL Ratio 2.4 <5.0 (calc)   Non-HDL Cholesterol (Calc) 80 <784 mg/dL (calc)  CBC with Differential/Platelet   Collection Time: 12/14/23  2:17 PM  Result Value Ref Range   WBC 9.5 3.8 - 10.8 Thousand/uL   RBC 5.19 (H) 3.80 - 5.10 Million/uL   Hemoglobin 14.7 11.7 - 15.5 g/dL   HCT 69.6 29.5 - 28.4 %   MCV 86.3 80.0 - 100.0 fL   MCH 28.3 27.0 - 33.0 pg   MCHC 32.8 32.0 - 36.0 g/dL   RDW 13.2 44.0 - 10.2 %   Platelets 281 140 - 400 Thousand/uL   MPV 9.6 7.5 - 12.5 fL   Neutro Abs 5,871 1,500 - 7,800 cells/uL   Absolute Lymphocytes 2,670 850 - 3,900 cells/uL    Absolute Monocytes 808 200 - 950 cells/uL   Eosinophils Absolute 114 15 - 500 cells/uL   Basophils Absolute 38 0 - 200 cells/uL   Neutrophils Relative % 61.8 %   Total Lymphocyte 28.1 %   Monocytes Relative 8.5 %   Eosinophils Relative 1.2 %   Basophils Relative 0.4 %  COMPLETE METABOLIC PANEL WITH GFR   Collection Time: 12/14/23  2:17 PM  Result Value Ref Range   Glucose, Bld 128 (H) 65 - 99 mg/dL   BUN 19 7 - 25 mg/dL   Creat 7.25 (L) 3.66 - 1.00 mg/dL   eGFR 94 > OR = 60 YQ/IHK/7.42V9   BUN/Creatinine Ratio 33 (H) 6 - 22 (calc)   Sodium 137 135 - 146 mmol/L   Potassium 4.5 3.5 - 5.3 mmol/L   Chloride 99 98 - 110 mmol/L   CO2 27 20 - 32 mmol/L   Calcium  10.2 8.6 - 10.4 mg/dL   Total Protein 7.9 6.1 - 8.1 g/dL   Albumin 5.0 3.6 - 5.1 g/dL   Globulin 2.9 1.9 - 3.7 g/dL (calc)   AG Ratio 1.7 1.0 - 2.5 (calc)   Total Bilirubin 0.4 0.2 - 1.2 mg/dL   Alkaline phosphatase (APISO) 88 37 - 153 U/L   AST 22 10 - 35 U/L   ALT 20 6 - 29 U/L  HgB A1c   Collection Time: 12/14/23  2:17 PM  Result Value Ref Range   Hgb A1c MFr Bld 5.9 (H) <5.7 % of total Hgb   Mean Plasma Glucose  123 mg/dL   eAG (mmol/L) 6.8 mmol/L  TSH   Collection Time: 12/14/23  2:17 PM  Result Value Ref Range   TSH 1.55 0.40 - 4.50 mIU/L      Assessment & Plan:     ICD-10-CM   1. Essential hypertension  I10 valsartan  (DIOVAN ) 40 MG tablet    amLODipine  (NORVASC ) 10 MG tablet   still not well controlled, unclear again if she is taking norvasc  and arb as prescribed and directed, she did not follow last recommendations and reassurances Reviewed her med bottles, med list and wrote them out on her paper again. Consider combo pills to reduce pill anxiety/burden BP Readings from Last 3 Encounters:  12/21/23 (!) 148/76  12/14/23 (!) 142/78  10/27/23 (!) 158/80   Still feel like she needs to try good compliance with current meds and doses prior to any more changes - which also make pt anxious No lightheadedness,  no sx concerning for HTN urgency or low bp  Strongly encouraged her to take amlodipine  and valsartan  in am together with BB and f/up again in 2-4 weeks for recheck    2. Tachycardia  R00.0    slightly better HR today, when rechecked around 100, she did not take the full dose, was encouraged to take 50 mg tab qam and f/up again in 2-4 weeks Pulse Readings from Last 10 Encounters:  12/21/23 100  12/14/23 (!) 116  10/27/23 (!) 114  09/29/23 (!) 106  08/27/23 92  04/21/23 82  03/24/23 93  11/17/22 87  10/30/22 82  08/01/22 97  Again explained how metoprolol  works and showed her how her HR was previously controlled and still is not and she admitted only taking half dose Encouraged to take prescribed dose      3. Noncompliance with medications  Z91.148    due to anxiety, again wrote out all of her medications  and reassured her thay they are safe and encouraged her to take them as prescribed    4. Recurrent ventral hernia  K43.2    referred to general surgery for eval        Return for 2-4 week f/up BP and HR.   Adeline Hone, PA-C 12/21/23 2:41 PM

## 2024-01-11 ENCOUNTER — Encounter: Payer: Self-pay | Admitting: Family Medicine

## 2024-01-11 ENCOUNTER — Ambulatory Visit (INDEPENDENT_AMBULATORY_CARE_PROVIDER_SITE_OTHER): Payer: PPO | Admitting: Family Medicine

## 2024-01-11 VITALS — BP 152/78 | HR 93 | Resp 16 | Ht 65.0 in | Wt 126.0 lb

## 2024-01-11 DIAGNOSIS — I10 Essential (primary) hypertension: Secondary | ICD-10-CM | POA: Diagnosis not present

## 2024-01-11 DIAGNOSIS — R000 Tachycardia, unspecified: Secondary | ICD-10-CM

## 2024-01-11 MED ORDER — AMLODIPINE BESYLATE-VALSARTAN 10-160 MG PO TABS: 1.0000 | ORAL_TABLET | Freq: Every day | ORAL | 0 refills | Status: DC

## 2024-01-11 NOTE — Patient Instructions (Signed)
 Set aside your amlodipine pill bottle and your valsartan (blue) pill bottles and start the new medication that got sent to the pharmacy   (See the new med list on the following page)

## 2024-01-11 NOTE — Progress Notes (Unsigned)
 Name: Laura Deleon   MRN: [id number]    DOB: 1946/04/13   Date:01/11/2024       Progress Note  Chief Complaint  Patient presents with   Hypertension    3 week follow-up     Subjective:   Laura Deleon is a 78 y.o. female, presents to clinic for routine follow up on chronic conditions  Here for recheck of HTN and pulse Medications changed, she restarted BB, and she has resumed the prescribed dose BP Readings from Last 10 Encounters:  01/11/24 (!) 152/78  12/21/23 (!) 148/76  12/14/23 (!) 142/78  10/27/23 (!) 158/80  09/29/23 (!) 148/84  08/27/23 (!) 154/82  06/11/23 122/78  04/21/23 (!) 160/92  03/24/23 134/76  11/17/22 (!) 162/88   Pulse Readings from Last 10 Encounters:  01/11/24 93  12/21/23 100  12/14/23 (!) 116  10/27/23 (!) 114  09/29/23 (!) 106  08/27/23 92  04/21/23 82  03/24/23 93  11/17/22 87  10/30/22 82  Tachy since metoprolol was d/c by other provider around October   Last OV 2/3:  Tachycardia  R00.0 TSH     she feels jittery and anxious today, HR still elevated when rechecked manually in room - 120's, w/o CP SOB, add on TSH, close f/up   Of note BB was discontinued a few months ago by other provider - in October - after that pt HR elevated - will likely restart her on metoprolol     Pulse Readings from Last 6 Encounters:  12/14/23 (!) 116  10/27/23 (!) 114  09/29/23 (!) 106  08/27/23 92  04/21/23 82  03/24/23 93     Essential hypertension  I10 COMPLETE METABOLIC PANEL WITH GFR     mildly elevated, acceptable for pt with age and hx of anxiety/white coat htn, on amlodipine 10 and valsartan 40    Pt was very confused about the different medication and adding the doses all up together and taking them at different times a day.  She was ressured that all the meds work [employer] and work together.  She can take all at the same time since almost all of them have long duration of action and half life  Wrote it all out on her  paper     Current Outpatient Medications:    amLODipine (NORVASC) 10 MG tablet, Take 1 tablet (10 mg total) by mouth in the morning., Disp: 90 tablet, Rfl: 1   atorvastatin (LIPITOR) 10 MG tablet, TAKE 1 TABLET(10 MG) BY MOUTH DAILY at bedtime, Disp: 90 tablet, Rfl: 3   cholecalciferol (VITAMIN D3) 25 MCG (1000 UNIT) tablet, Take 1,000 Units by mouth daily., Disp: , Rfl:    ezetimibe (ZETIA) 10 MG tablet, TAKE 1 TABLET(10 MG) BY MOUTH DAILY, Disp: 90 tablet, Rfl: 3   metoprolol succinate (TOPROL-XL) 50 MG 24 hr tablet, Take 1 tablet (50 mg total) by mouth in the morning. Take with or immediately following a meal., Disp: 90 tablet, Rfl: 1   Multiple Vitamin (MULTI-VITAMINS) TABS, Take 1 tablet by mouth daily., Disp: , Rfl:    triamcinolone ointment (KENALOG) 0.1 %, Apply 1 Application topically 2 (two) times daily as needed (area of raised red rash/itchy)., Disp: 30 g, Rfl: 0   valsartan (DIOVAN) 40 MG tablet, Take 1 tablet (40 mg total) by mouth in the morning., Disp: 90 tablet, Rfl: 1  Patient Active Problem List   Diagnosis Date Noted   Age-related osteoporosis without current pathological fracture 08/01/2022   Anxiety 07/11/2021  Hx of transient ischemic attack (TIA) 02/17/2019   Peripheral neuropathy 08/17/2018   Calcification of abdominal aorta (HCC) 12/16/2016   Advance care planning 08/19/2016   Allergic rhinitis, seasonal 07/19/2015   Essential hypertension 07/19/2015   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   Hyperlipidemia LDL goal <70 07/19/2015   Recurrent ventral hernia 06/23/2013    Past Surgical History:  Procedure Laterality Date   ABDOMINAL HYSTERECTOMY     BLADDER SURGERY     HAND SURGERY  11/14/2016   removal of foreign body   HERNIA REPAIR     with mesh   HIP ARTHROPLASTY Left 07/04/2020   Procedure: ARTHROPLASTY BIPOLAR HIP (HEMIARTHROPLASTY);  Surgeon: Signa Kell, MD;  Location: [facility] OR[facility];  Service: Orthopedics;  Laterality: Left;    Family History  Problem  Relation Age of Onset   Hyperlipidemia Mother    Hypertension Mother    Hyperlipidemia Father    Emphysema Father    COPD Father    Breast cancer Neg Hx     Social History   Tobacco Use   Smoking status: Never   Smokeless tobacco: Never   Tobacco comments:    smoking cessation materials not required  Vaping Use   Vaping status: Never Used  Substance Use Topics   Alcohol use: No    Alcohol/week: 0.0 standard drinks of alcohol   Drug use: No     Allergies  Allergen Reactions   Sulfa Antibiotics Shortness Of Breath   Latex     Health Maintenance  Topic Date Due   MAMMOGRAM  07/04/2023   Zoster Vaccines- Shingrix (1 of 2) 02/28/2024 (Originally 07/31/1965)   Medicare Annual Wellness (AWV)  06/10/2024   DEXA SCAN  07/03/2024   DTaP/Tdap/Td (4 - Td or Tdap) 08/08/2030   Pneumonia Vaccine 32+ Years old  Completed   INFLUENZA VACCINE  Completed   Hepatitis C Screening  Completed   HPV VACCINES  Aged Out   Colonoscopy  Discontinued   COVID-19 Vaccine  Discontinued    Chart Review Today: I personally reviewed active problem list, medication list, allergies, family history, social history, health maintenance, notes from last encounter, lab results, imaging with the patient/caregiver today.   Review of Systems  Constitutional: Negative.   HENT: Negative.    Eyes: Negative.   Respiratory: Negative.    Cardiovascular: Negative.   Gastrointestinal: Negative.   Endocrine: Negative.   Genitourinary: Negative.   Musculoskeletal: Negative.   Skin: Negative.   Allergic/Immunologic: Negative.   Neurological: Negative.   Hematological: Negative.   Psychiatric/Behavioral: Negative.    All other systems reviewed and are negative.    Objective:   Vitals:   01/11/24 1320 01/11/24 1331  BP: (!) 168/82 (!) 152/78  Pulse: 93   Resp: 16   SpO2: 99%   Weight: 126 lb (57.2 kg)   Height: 5\' 5"  (1.651 m)     Body mass index is 20.97 kg/m.  Physical Exam Vitals and  nursing note reviewed.  Constitutional:      Appearance: Normal appearance. She is well-developed.  HENT:     Head: Normocephalic and atraumatic.     Nose: Nose normal.  Eyes:     General:        Right eye: No discharge.        Left eye: No discharge.     Conjunctiva/sclera: Conjunctivae normal.  Neck:     Trachea: No tracheal deviation.  Cardiovascular:     Rate and Rhythm: Normal rate and regular rhythm.  Pulses: Normal pulses.     Heart sounds: Normal heart sounds. No murmur heard.    No friction rub. No gallop.  Pulmonary:     Effort: Pulmonary effort is normal. No respiratory distress.     Breath sounds: No stridor.  Musculoskeletal:        General: Normal range of motion.  Skin:    General: Skin is warm and dry.     Findings: No rash.  Neurological:     Mental Status: She is alert.     Motor: No abnormal muscle tone.     Coordination: Coordination normal.     Gait: Gait abnormal.  Psychiatric:        Behavior: Behavior normal.      Functional Status Survey:   Results for orders placed or performed in visit on 12/14/23  Lipid Profile   Collection Time: 12/14/23  2:17 PM  Result Value Ref Range   Cholesterol 137 <200 mg/dL   HDL 57 > OR = 50 mg/dL   Triglycerides 409 <811 mg/dL   LDL Cholesterol (Calc) 60 mg/dL (calc)   Total CHOL/HDL Ratio 2.4 <5.0 (calc)   Non-HDL Cholesterol (Calc) 80 <914 mg/dL (calc)  CBC with Differential/Platelet   Collection Time: 12/14/23  2:17 PM  Result Value Ref Range   WBC 9.5 3.8 - 10.8 Thousand/uL   RBC 5.19 (H) 3.80 - 5.10 Million/uL   Hemoglobin 14.7 11.7 - 15.5 g/dL   HCT 78.2 95.6 - 21.3 %   MCV 86.3 80.0 - 100.0 fL   MCH 28.3 27.0 - 33.0 pg   MCHC 32.8 32.0 - 36.0 g/dL   RDW 08.6 57.8 - 46.9 %   Platelets 281 140 - 400 Thousand/uL   MPV 9.6 7.5 - 12.5 fL   Neutro Abs 5,871 1,500 - 7,800 cells/uL   Absolute Lymphocytes 2,670 850 - 3,900 cells/uL   Absolute Monocytes 808 200 - 950 cells/uL   Eosinophils Absolute  114 15 - 500 cells/uL   Basophils Absolute 38 0 - 200 cells/uL   Neutrophils Relative % 61.8 %   Total Lymphocyte 28.1 %   Monocytes Relative 8.5 %   Eosinophils Relative 1.2 %   Basophils Relative 0.4 %  COMPLETE METABOLIC PANEL WITH GFR   Collection Time: 12/14/23  2:17 PM  Result Value Ref Range   Glucose, Bld 128 (H) 65 - 99 mg/dL   BUN 19 7 - 25 mg/dL   Creat 6.29 (L) 5.28 - 1.00 mg/dL   eGFR 94 > OR = 60 UX/LKG/4.01U2   BUN/Creatinine Ratio 33 (H) 6 - 22 (calc)   Sodium 137 135 - 146 mmol/L   Potassium 4.5 3.5 - 5.3 mmol/L   Chloride 99 98 - 110 mmol/L   CO2 27 20 - 32 mmol/L   Calcium 10.2 8.6 - 10.4 mg/dL   Total Protein 7.9 6.1 - 8.1 g/dL   Albumin 5.0 3.6 - 5.1 g/dL   Globulin 2.9 1.9 - 3.7 g/dL (calc)   AG Ratio 1.7 1.0 - 2.5 (calc)   Total Bilirubin 0.4 0.2 - 1.2 mg/dL   Alkaline phosphatase (APISO) 88 37 - 153 U/L   AST 22 10 - 35 U/L   ALT 20 6 - 29 U/L  HgB A1c   Collection Time: 12/14/23  2:17 PM  Result Value Ref Range   Hgb A1c MFr Bld 5.9 (H) <5.7 % of total Hgb   Mean Plasma Glucose 123 mg/dL   eAG (mmol/L) 6.8 mmol/L  TSH  Collection Time: 12/14/23  2:17 PM  Result Value Ref Range   TSH 1.55 0.40 - 4.50 mIU/L      Assessment & Plan:   1. Essential hypertension (Primary) BP Readings from Last 10 Encounters:  01/11/24 (!) 152/78  12/21/23 (!) 148/76  12/14/23 (!) 142/78  10/27/23 (!) 158/80  09/29/23 (!) 148/84  08/27/23 (!) 154/82  06/11/23 122/78  04/21/23 (!) 160/92  03/24/23 134/76  11/17/22 (!) 162/88  Unfortunately still not well controlled though she does report taking medications daily She is anxious and has some white coat HTN, so difficult to tell if it is well controlled She is also anxious about taking meds, will combine her meds into combo pill, reviewed with her that she will stop individual valsartan and amlodipine and get the new one from the pharmacy Recheck in 4 weeks - amLODipine-valsartan (EXFORGE) 10-160 MG tablet;  Take 1 tablet by mouth daily.  Dispense: 30 tablet; Refill: 0  2. Tachycardia Improved with taking correct/full dose, HR is improved Less symptomatic and tolerating BB well, continue metoprolol XL 50 mg dose once daily  Pulse Readings from Last 3 Encounters:  01/11/24 93  12/21/23 100  12/14/23 (!) 116    Return for 4 week BP recheck in office.   Danelle Berry, PA-C 01/11/24 1:42 PM

## 2024-01-14 ENCOUNTER — Encounter: Payer: Self-pay | Admitting: Family Medicine

## 2024-02-08 ENCOUNTER — Ambulatory Visit: Admitting: Nurse Practitioner

## 2024-02-08 ENCOUNTER — Ambulatory Visit: Admitting: Family Medicine

## 2024-02-10 ENCOUNTER — Encounter: Payer: Self-pay | Admitting: Family Medicine

## 2024-02-10 ENCOUNTER — Ambulatory Visit: Admitting: Family Medicine

## 2024-02-10 VITALS — BP 138/76 | HR 91 | Resp 16 | Ht 65.0 in | Wt 128.0 lb

## 2024-02-10 DIAGNOSIS — E785 Hyperlipidemia, unspecified: Secondary | ICD-10-CM | POA: Diagnosis not present

## 2024-02-10 DIAGNOSIS — S90561A Insect bite (nonvenomous), right ankle, initial encounter: Secondary | ICD-10-CM

## 2024-02-10 DIAGNOSIS — R000 Tachycardia, unspecified: Secondary | ICD-10-CM | POA: Diagnosis not present

## 2024-02-10 DIAGNOSIS — J302 Other seasonal allergic rhinitis: Secondary | ICD-10-CM

## 2024-02-10 DIAGNOSIS — I10 Essential (primary) hypertension: Secondary | ICD-10-CM

## 2024-02-10 DIAGNOSIS — F419 Anxiety disorder, unspecified: Secondary | ICD-10-CM | POA: Diagnosis not present

## 2024-02-10 DIAGNOSIS — W57XXXA Bitten or stung by nonvenomous insect and other nonvenomous arthropods, initial encounter: Secondary | ICD-10-CM

## 2024-02-10 MED ORDER — DOXYCYCLINE HYCLATE 100 MG PO TABS: 200.0000 mg | ORAL_TABLET | Freq: Once | ORAL | 0 refills | Status: AC

## 2024-02-10 MED ORDER — AMLODIPINE BESYLATE-VALSARTAN 10-160 MG PO TABS: 1.0000 | ORAL_TABLET | Freq: Every day | ORAL | 1 refills | Status: DC

## 2024-02-10 NOTE — Progress Notes (Signed)
 Name: Laura Deleon   MRN: [id number]    DOB: Jul 21, 1946   Date:02/10/2024       Progress Note  Chief Complaint  Patient presents with   Follow-up     Subjective:   Laura Deleon is a 78 y.o. female, presents to clinic for routine follow up on chronic conditions  HTN -  Blood pressure is improved today with med changes, she is liking the change in blood pressure meds-due to combo pill and reduced pill burden she has noted improved compliance with this and is taking the amlodipine-valsartan and metoprolol once daily as directed BP Readings from Last 3 Encounters:  02/10/24 138/76  01/11/24 (!) 152/78  12/21/23 (!) 148/76   Pulse Readings from Last 3 Encounters:  02/10/24 91  01/11/24 93  12/21/23 100   Blood pressure is improving and is near goal and her heart rate has remained in a normal range with better med compliance  She is concerned about a tick bite, pulled off of her leg suspected to be there for a few days has a itchy red bump, not having any fever chills sweats joint pain sore throat headache  Cholesterol manage on Lipitor -good med compliance and no new concerns Lab Results  Component Value Date   CHOL 137 12/14/2023   HDL 57 12/14/2023   LDLCALC 60 12/14/2023   TRIG 117 12/14/2023   CHOLHDL 2.4 12/14/2023   Seasonal allergies, worsening   Current Outpatient Medications:    amLODipine-valsartan (EXFORGE) 10-160 MG tablet, Take 1 tablet by mouth daily., Disp: 30 tablet, Rfl: 0   atorvastatin (LIPITOR) 10 MG tablet, TAKE 1 TABLET(10 MG) BY MOUTH DAILY at bedtime, Disp: 90 tablet, Rfl: 3   cholecalciferol (VITAMIN D3) 25 MCG (1000 UNIT) tablet, Take 1,000 Units by mouth daily., Disp: , Rfl:    ezetimibe (ZETIA) 10 MG tablet, TAKE 1 TABLET(10 MG) BY MOUTH DAILY, Disp: 90 tablet, Rfl: 3   metoprolol succinate (TOPROL-XL) 50 MG 24 hr tablet, Take 1 tablet (50 mg total) by mouth in the morning. Take with or immediately following a meal., Disp: 90 tablet,  Rfl: 1   Multiple Vitamin (MULTI-VITAMINS) TABS, Take 1 tablet by mouth daily., Disp: , Rfl:    triamcinolone ointment (KENALOG) 0.1 %, Apply 1 Application topically 2 (two) times daily as needed (area of raised red rash/itchy)., Disp: 30 g, Rfl: 0  Patient Active Problem List   Diagnosis Date Noted   Age-related osteoporosis without current pathological fracture 08/01/2022   Anxiety 07/11/2021   Hx of transient ischemic attack (TIA) 02/17/2019   Peripheral neuropathy 08/17/2018   Calcification of abdominal aorta (HCC) 12/16/2016   Advance care planning 08/19/2016   Allergic rhinitis, seasonal 07/19/2015   Essential hypertension 07/19/2015   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   Hyperlipidemia LDL goal <70 07/19/2015   Recurrent ventral hernia 06/23/2013    Past Surgical History:  Procedure Laterality Date   ABDOMINAL HYSTERECTOMY     BLADDER SURGERY     HAND SURGERY  11/14/2016   removal of foreign body   HERNIA REPAIR     with mesh   HIP ARTHROPLASTY Left 07/04/2020   Procedure: ARTHROPLASTY BIPOLAR HIP (HEMIARTHROPLASTY);  Surgeon: Lorri Rota, MD;  Location: [facility] ORS;  Service: Orthopedics;  Laterality: Left;    Family History  Problem Relation Age of Onset   Hyperlipidemia Mother    Hypertension Mother    Hyperlipidemia Father    Emphysema Father    COPD Father  Breast cancer Neg Hx     Social History   Tobacco Use   Smoking status: Never   Smokeless tobacco: Never   Tobacco comments:    smoking cessation materials not required  Vaping Use   Vaping status: Never Used  Substance Use Topics   Alcohol use: No    Alcohol/week: 0.0 standard drinks of alcohol   Drug use: No     Allergies  Allergen Reactions   Sulfa Antibiotics Shortness Of Breath   Latex     Health Maintenance  Topic Date Due   MAMMOGRAM  07/04/2023   Zoster Vaccines- Shingrix (1 of 2) 02/28/2024 (Originally 07/31/1965)   INFLUENZA VACCINE  06/10/2024   Medicare Annual Wellness (AWV)   06/10/2024   DEXA SCAN  07/03/2024   DTaP/Tdap/Td (4 - Td or Tdap) 08/08/2030   Pneumonia Vaccine 65+ Years old  Completed   Hepatitis C Screening  Completed   HPV VACCINES  Aged Out   Colonoscopy  Discontinued   COVID-19 Vaccine  Discontinued    Chart Review Today: I personally reviewed active problem list, medication list, allergies, family history, social history, health maintenance, notes from last encounter, lab results, imaging with the patient/caregiver today.   Review of Systems  Constitutional: Negative.   HENT: Negative.    Eyes: Negative.   Respiratory: Negative.    Cardiovascular: Negative.   Gastrointestinal: Negative.   Endocrine: Negative.   Genitourinary: Negative.   Musculoskeletal: Negative.   Skin: Negative.   Allergic/Immunologic: Negative.   Neurological: Negative.   Hematological: Negative.   Psychiatric/Behavioral: Negative.    All other systems reviewed and are negative.         Objective:   Vitals:   02/10/24 1320  BP: 138/76  Pulse: 91  Resp: 16  SpO2: 100%  Weight: 128 lb (58.1 kg)  Height: 5\' 5"  (1.651 m)    Body mass index is 21.3 kg/m.  Physical Exam Vitals and nursing note reviewed.  Constitutional:      General: She is not in acute distress.    Appearance: Normal appearance. She is well-developed. She is not ill-appearing, toxic-appearing or diaphoretic.  HENT:     Head: Normocephalic and atraumatic.     Nose: Nose normal.  Eyes:     General:        Right eye: No discharge.        Left eye: No discharge.     Conjunctiva/sclera: Conjunctivae normal.  Neck:     Trachea: No tracheal deviation.  Cardiovascular:     Rate and Rhythm: Normal rate and regular rhythm.     Pulses: Normal pulses.     Heart sounds: Normal heart sounds.  Pulmonary:     Effort: Pulmonary effort is normal. No respiratory distress.     Breath sounds: Normal breath sounds. No stridor.  Skin:    General: Skin is warm and dry.     Findings: Rash  present.  Neurological:     Mental Status: She is alert.     Motor: No abnormal muscle tone.     Coordination: Coordination normal.  Psychiatric:        Behavior: Behavior normal.        Results for orders placed or performed in visit on 12/14/23  Lipid Profile   Collection Time: 12/14/23  2:17 PM  Result Value Ref Range   Cholesterol 137 <200 mg/dL   HDL 57 > OR = 50 mg/dL   Triglycerides 161 <096 mg/dL   LDL  Cholesterol (Calc) 60 mg/dL (calc)   Total CHOL/HDL Ratio 2.4 <5.0 (calc)   Non-HDL Cholesterol (Calc) 80 <147 mg/dL (calc)  CBC with Differential/Platelet   Collection Time: 12/14/23  2:17 PM  Result Value Ref Range   WBC 9.5 3.8 - 10.8 Thousand/uL   RBC 5.19 (H) 3.80 - 5.10 Million/uL   Hemoglobin 14.7 11.7 - 15.5 g/dL   HCT 82.9 56.2 - 13.0 %   MCV 86.3 80.0 - 100.0 fL   MCH 28.3 27.0 - 33.0 pg   MCHC 32.8 32.0 - 36.0 g/dL   RDW 86.5 78.4 - 69.6 %   Platelets 281 140 - 400 Thousand/uL   MPV 9.6 7.5 - 12.5 fL   Neutro Abs 5,871 1,500 - 7,800 cells/uL   Absolute Lymphocytes 2,670 850 - 3,900 cells/uL   Absolute Monocytes 808 200 - 950 cells/uL   Eosinophils Absolute 114 15 - 500 cells/uL   Basophils Absolute 38 0 - 200 cells/uL   Neutrophils Relative % 61.8 %   Total Lymphocyte 28.1 %   Monocytes Relative 8.5 %   Eosinophils Relative 1.2 %   Basophils Relative 0.4 %  COMPLETE METABOLIC PANEL WITH GFR   Collection Time: 12/14/23  2:17 PM  Result Value Ref Range   Glucose, Bld 128 (H) 65 - 99 mg/dL   BUN 19 7 - 25 mg/dL   Creat 2.95 (L) 2.84 - 1.00 mg/dL   eGFR 94 > OR = 60 XL/KGM/0.10U7   BUN/Creatinine Ratio 33 (H) 6 - 22 (calc)   Sodium 137 135 - 146 mmol/L   Potassium 4.5 3.5 - 5.3 mmol/L   Chloride 99 98 - 110 mmol/L   CO2 27 20 - 32 mmol/L   Calcium 10.2 8.6 - 10.4 mg/dL   Total Protein 7.9 6.1 - 8.1 g/dL   Albumin 5.0 3.6 - 5.1 g/dL   Globulin 2.9 1.9 - 3.7 g/dL (calc)   AG Ratio 1.7 1.0 - 2.5 (calc)   Total Bilirubin 0.4 0.2 - 1.2 mg/dL    Alkaline phosphatase (APISO) 88 37 - 153 U/L   AST 22 10 - 35 U/L   ALT 20 6 - 29 U/L  HgB A1c   Collection Time: 12/14/23  2:17 PM  Result Value Ref Range   Hgb A1c MFr Bld 5.9 (H) <5.7 % of total Hgb   Mean Plasma Glucose 123 mg/dL   eAG (mmol/L) 6.8 mmol/L  TSH   Collection Time: 12/14/23  2:17 PM  Result Value Ref Range   TSH 1.55 0.40 - 4.50 mIU/L      Assessment & Plan:   Essential hypertension Assessment & Plan: Blood pressure improving and nearing goal with recent blood pressure changes Continue amlodipine-valsartan 10-160 mg dose once daily Also recommend continuing other diet and lifestyle efforts BP Readings from Last 3 Encounters:  02/10/24 138/76  01/11/24 (!) 152/78  12/21/23 (!) 148/76  Improving, continue medications diet lifestyle efforts, no changes at this time   Orders: -     amLODIPine Besylate-Valsartan; Take 1 tablet by mouth daily. For high blood pressure  Dispense: 90 tablet; Refill: 1  Seasonal allergic rhinitis, unspecified trigger Assessment & Plan: Encouraged her to consider allergy medications if having bothersome seasonal allergies  Reviewed safety and dosing of over-the-counter medications like Claritin Zyrtec or Allegra   Tick bite of right ankle, initial encounter -     Doxycycline Hyclate; Take 2 tablets (200 mg total) by mouth once for 1 dose.  Dispense: 2 tablet; Refill: 0  Take bite to right ankle with a small red bump that has been itchy but no change to the area and nothing that appears like erythema migrans, I did explain to her that she could have a local reaction with some swelling redness and itching and that alone does not indicate tickborne illnesses requiring antibiotics or treatment, we did review that she could take a one-time dose of 200 mg doxycycline which could be prophylactic to try and prevent her from having something like Lyme disease.  Reviewed the medications and possible side effects Gave her handouts and  information about tickborne illness symptoms to watch for which could occur in the next weeks to month and I would want her to come back for an evaluation if she developed myalgias, arthralgias, rashes headache sore throat lymphadenopathy.  Symptoms and handouts reviewed, patient verbalized understanding  Hyperlipidemia LDL goal <70 Assessment & Plan: Labs recently done and reviewed, continue Lipitor 10 mg daily   Anxiety Assessment & Plan: Patient anxious and nervous often with high blood pressure and heart rate readings in office, difficult to obtain home readings because that also causes her to be anxious.  She tends to have some anxiety about her health and about taking medications or taking too many medications or that the prescribed medications interact or cannot be taken with each other despite multiple times reassuring her that I am only prescribing pills that can and should be taken together for her health issues Did seem to have some success with changing her blood pressure pills into 1 combo pill to decrease pill burden We will continue to monitor her anxiety symptoms at this time she does not want any more medications so additional resources or treatments for anxiety was not discussed today   Tachycardia Assessment & Plan: Heart rate has remained in a normal range with the last 2 office visits with better compliance taking her beta-blocker daily Continue metoprolol 50 mg once daily in the morning      Return for 5 month f/up HTN and prediabetes.   Adeline Hone, PA-C 02/10/24 1:34 PM

## 2024-02-10 NOTE — Patient Instructions (Addendum)
 You can try over the counter allergy medications if you're starting to have some seasonal allergy symptoms - try generic allegra, claritin, zyrtec or xyzal  For your eyes you can try over the counter pataday drops - they help itchy eyes from allergies    You are due for your mammogram and I did order it already Just call to schedule it [facility] [address], [place], [place] [address] Scheduling phone #: [phone]

## 2024-02-24 ENCOUNTER — Encounter: Payer: Self-pay | Admitting: Family Medicine

## 2024-02-24 DIAGNOSIS — R000 Tachycardia, unspecified: Secondary | ICD-10-CM | POA: Insufficient documentation

## 2024-02-24 NOTE — Assessment & Plan Note (Signed)
 Encouraged her to consider allergy medications if having bothersome seasonal allergies  Reviewed safety and dosing of over-the-counter medications like Claritin Zyrtec or Allegra

## 2024-02-24 NOTE — Addendum Note (Signed)
 Addended by: Quiara Killian on: 02/24/2024 06:42 PM   Modules accepted: Level of Service

## 2024-02-24 NOTE — Assessment & Plan Note (Signed)
 Blood pressure improving and nearing goal with recent blood pressure changes Continue amlodipine-valsartan 10-160 mg dose once daily Also recommend continuing other diet and lifestyle efforts BP Readings from Last 3 Encounters:  02/10/24 138/76  01/11/24 (!) 152/78  12/21/23 (!) 148/76  Improving, continue medications diet lifestyle efforts, no changes at this time

## 2024-02-24 NOTE — Assessment & Plan Note (Signed)
 Heart rate has remained in a normal range with the last 2 office visits with better compliance taking her beta-blocker daily Continue metoprolol 50 mg once daily in the morning

## 2024-02-24 NOTE — Assessment & Plan Note (Signed)
 Patient anxious and nervous often with high blood pressure and heart rate readings in office, difficult to obtain home readings because that also causes her to be anxious.  She tends to have some anxiety about her health and about taking medications or taking too many medications or that the prescribed medications interact or cannot be taken with each other despite multiple times reassuring her that I am only prescribing pills that can and should be taken together for her health issues Did seem to have some success with changing her blood pressure pills into 1 combo pill to decrease pill burden We will continue to monitor her anxiety symptoms at this time she does not want any more medications so additional resources or treatments for anxiety was not discussed today

## 2024-02-24 NOTE — Assessment & Plan Note (Signed)
 Labs recently done and reviewed, continue Lipitor 10 mg daily

## 2024-04-11 ENCOUNTER — Other Ambulatory Visit: Payer: Self-pay

## 2024-04-11 ENCOUNTER — Telehealth: Payer: Self-pay

## 2024-04-11 DIAGNOSIS — E785 Hyperlipidemia, unspecified: Secondary | ICD-10-CM

## 2024-04-11 MED ORDER — EZETIMIBE 10 MG PO TABS: ORAL_TABLET | ORAL | 1 refills | Status: DC

## 2024-04-11 NOTE — Telephone Encounter (Signed)
 Refill request on   ezetimibe  (ZETIA ) 10 MG tablet

## 2024-04-11 NOTE — Telephone Encounter (Signed)
 Refill sent.

## 2024-05-27 ENCOUNTER — Other Ambulatory Visit: Payer: Self-pay | Admitting: Family Medicine

## 2024-05-27 DIAGNOSIS — I10 Essential (primary) hypertension: Secondary | ICD-10-CM

## 2024-05-27 DIAGNOSIS — E785 Hyperlipidemia, unspecified: Secondary | ICD-10-CM

## 2024-05-28 ENCOUNTER — Other Ambulatory Visit: Payer: Self-pay | Admitting: Family Medicine

## 2024-05-28 DIAGNOSIS — R000 Tachycardia, unspecified: Secondary | ICD-10-CM

## 2024-05-28 DIAGNOSIS — I10 Essential (primary) hypertension: Secondary | ICD-10-CM

## 2024-05-30 ENCOUNTER — Other Ambulatory Visit: Payer: Self-pay | Admitting: Family Medicine

## 2024-05-30 ENCOUNTER — Telehealth: Payer: Self-pay | Admitting: Family Medicine

## 2024-05-30 NOTE — Telephone Encounter (Signed)
 Unable to refill per protocol, Rx discontinued 12/21/23.  Requested Prescriptions  Pending Prescriptions Disp Refills   valsartan  (DIOVAN ) 40 MG tablet [Pharmacy Med Name: VALSARTAN  40MG  TABLETS] 30 tablet 0    Sig: TAKE 1 TABLET(40 MG) BY MOUTH DAILY     Cardiovascular:  Angiotensin Receptor Blockers Failed - 05/30/2024 11:11 AM      Failed - Cr in normal range and within 180 days    Creat  Date Value Ref Range Status  12/14/2023 0.58 (L) 0.60 - 1.00 mg/dL Final         Passed - K in normal range and within 180 days    Potassium  Date Value Ref Range Status  12/14/2023 4.5 3.5 - 5.3 mmol/L Final         Passed - Patient is not pregnant      Passed - Last BP in normal range    BP Readings from Last 1 Encounters:  02/10/24 138/76         Passed - Valid encounter within last 6 months    Recent Outpatient Visits           3 months ago Essential hypertension   [facility] Leavy Mole, PA-C   4 months ago Essential hypertension   [facility] Leavy Mole, PA-C   5 months ago Essential hypertension   [facility] Leavy Mole, PA-C   5 months ago Adult general medical exam   [facility] Leavy Mole, PA-C

## 2024-05-30 NOTE — Telephone Encounter (Signed)
atorvastatin (LIPITOR) 10 MG tablet  ° °

## 2024-05-30 NOTE — Telephone Encounter (Signed)
 Refill sent.

## 2024-05-30 NOTE — Telephone Encounter (Signed)
 Copied from CRM [id number]. Topic: Clinical - Medication Refill >> May 30, 2024 10:23 AM Mia F wrote: Medication: atorvastatin  (LIPITOR) 10 MG tablet  metoprolol  succinate (TOPROL -XL) 50 MG 24 hr tablet  ezetimibe  (ZETIA ) 10 MG tablet   amLODipine -valsartan  (EXFORGE ) 10-160 MG tablet    Has the patient contacted their pharmacy? Yes (Agent: If no, request that the patient contact the pharmacy for the refill. If patient does not wish to contact the pharmacy document the reason why and proceed with request.) (Agent: If yes, when and what did the pharmacy advise?)  This is the patient's preferred pharmacy:   [id number] [place], [place] - [address] AT [facility] OF [address] [place] [address] [place] [address] Phone: [phone] Fax: [phone]  Is this the correct pharmacy for this prescription? Yes If no, delete pharmacy and type the correct one.   Has the prescription been filled recently? No  Is the patient out of the medication? No  Has the patient been seen for an appointment in the last year OR does the patient have an upcoming appointment? Yes  Can we respond through MyChart? Yes  Agent: Please be advised that Rx refills may take up to 3 business days. We ask that you follow-up with your pharmacy.

## 2024-05-30 NOTE — Telephone Encounter (Signed)
 Second request

## 2024-05-30 NOTE — Telephone Encounter (Signed)
 FYI Only or Action Required?: Action required by provider: medication refill request.  Patient was last seen in primary care on 08/01/2022 by Gareth Mliss FALCON, FNP.  Called Nurse Triage reporting No chief complaint on file..  Symptoms began today.  Interventions attempted: Nothing.  Symptoms are: stable.  Triage Disposition: No disposition on file.  Patient/caregiver understands and will follow disposition?:

## 2024-05-31 NOTE — Telephone Encounter (Signed)
 Requested Prescriptions  Refused Prescriptions Disp Refills   metoprolol  succinate (TOPROL -XL) 50 MG 24 hr tablet [Pharmacy Med Name: METOPROLOL  ER SUCCINATE 50MG  TABS] 90 tablet 1    Sig: TAKE 1 TABLET(50 MG) BY MOUTH DAILY AFTER A MEAL     Cardiovascular:  Beta Blockers Passed - 05/31/2024 10:44 AM      Passed - Last BP in normal range    BP Readings from Last 1 Encounters:  02/10/24 138/76         Passed - Last Heart Rate in normal range    Pulse Readings from Last 1 Encounters:  02/10/24 91         Passed - Valid encounter within last 6 months    Recent Outpatient Visits           3 months ago Essential hypertension   [facility] Leavy Mole, PA-C   4 months ago Essential hypertension   [facility] Leavy Mole, PA-C   5 months ago Essential hypertension   [facility] Leavy Mole, PA-C   5 months ago Adult general medical exam   [facility] Leavy Mole, PA-C

## 2024-06-30 ENCOUNTER — Ambulatory Visit
Admission: RE | Admit: 2024-06-30 | Discharge: 2024-06-30 | Disposition: A | Discharge status: Home / Self Care | Source: Ambulatory Visit | Attending: Family Medicine | Admitting: Family Medicine

## 2024-06-30 DIAGNOSIS — Z1231 Encounter for screening mammogram for malignant neoplasm of breast: Secondary | ICD-10-CM | POA: Diagnosis not present

## 2024-07-06 ENCOUNTER — Ambulatory Visit: Payer: Self-pay | Admitting: Family Medicine

## 2024-07-12 ENCOUNTER — Ambulatory Visit: Admitting: Family Medicine

## 2024-07-26 ENCOUNTER — Ambulatory Visit: Admitting: Family Medicine

## 2024-08-05 ENCOUNTER — Ambulatory Visit: Admitting: Family Medicine

## 2024-08-09 ENCOUNTER — Other Ambulatory Visit: Payer: Self-pay | Admitting: Family Medicine

## 2024-08-09 DIAGNOSIS — I10 Essential (primary) hypertension: Secondary | ICD-10-CM

## 2024-08-10 ENCOUNTER — Other Ambulatory Visit: Payer: Self-pay

## 2024-08-10 DIAGNOSIS — I10 Essential (primary) hypertension: Secondary | ICD-10-CM

## 2024-08-10 MED ORDER — AMLODIPINE BESYLATE-VALSARTAN 10-160 MG PO TABS: 1.0000 | ORAL_TABLET | Freq: Every day | ORAL | 1 refills | Status: DC

## 2024-08-10 NOTE — Telephone Encounter (Signed)
 Signed 08/10/24, duplicate request.  Requested Prescriptions  Pending Prescriptions Disp Refills   amLODipine -valsartan  (EXFORGE ) 10-160 MG tablet [Pharmacy Med Name: AMLODIPINE -VALSARTAN  10-160MG  TABS] 90 tablet 1    Sig: TAKE 1 TABLET BY MOUTH DAILY FOR HIGH BLOOD PRESSURE     Cardiovascular: CCB + ARB Combos Failed - 08/10/2024  3:37 PM      Failed - K in normal range and within 180 days    Potassium  Date Value Ref Range Status  12/14/2023 4.5 3.5 - 5.3 mmol/L Final         Failed - Cr in normal range and within 180 days    Creat  Date Value Ref Range Status  12/14/2023 0.58 (L) 0.60 - 1.00 mg/dL Final         Failed - Na in normal range and within 180 days    Sodium  Date Value Ref Range Status  12/14/2023 137 135 - 146 mmol/L Final  07/20/2015 141 134 - 144 mmol/L Final         Failed - Valid encounter within last 6 months    Recent Outpatient Visits           6 months ago Essential hypertension   [facility] Leavy Mole, PA-C   7 months ago Essential hypertension   [facility] Leavy Mole, PA-C   7 months ago Essential hypertension   [place] [facility] Leavy Mole, PA-C   8 months ago Adult general medical exam   [facility] Leavy Mole, PA-C              Passed - Patient is not pregnant      Passed - Last BP in normal range    BP Readings from Last 1 Encounters:  02/10/24 138/76

## 2024-08-12 ENCOUNTER — Ambulatory Visit: Admitting: Family Medicine

## 2024-09-01 ENCOUNTER — Other Ambulatory Visit: Payer: Self-pay | Admitting: Physician Assistant

## 2024-09-01 ENCOUNTER — Other Ambulatory Visit: Payer: Self-pay | Admitting: Family Medicine

## 2024-09-01 DIAGNOSIS — R000 Tachycardia, unspecified: Secondary | ICD-10-CM

## 2024-09-01 DIAGNOSIS — I10 Essential (primary) hypertension: Secondary | ICD-10-CM

## 2024-09-01 DIAGNOSIS — E785 Hyperlipidemia, unspecified: Secondary | ICD-10-CM

## 2024-09-02 NOTE — Telephone Encounter (Signed)
 Requested Prescriptions  Pending Prescriptions Disp Refills   ezetimibe  (ZETIA ) 10 MG tablet [Pharmacy Med Name: EZETIMIBE  10MG  TABLETS] 90 tablet 1    Sig: TAKE 1 TABLET(10 MG) BY MOUTH DAILY     Cardiovascular:  Antilipid - Sterol Transport Inhibitors Failed - 09/02/2024  3:47 PM      Failed - Lipid Panel in normal range within the last 12 months    Cholesterol, Total  Date Value Ref Range Status  07/20/2015 245 (H) 100 - 199 mg/dL Final   Cholesterol  Date Value Ref Range Status  12/14/2023 137 <200 mg/dL Final   LDL Cholesterol (Calc)  Date Value Ref Range Status  12/14/2023 60 mg/dL (calc) Final    Comment:    Reference range: <100 . Desirable range <100 mg/dL for primary prevention;   <70 mg/dL for patients with CHD or diabetic patients  with > or = 2 CHD risk factors. SABRA LDL-C is now calculated using the Martin-Hopkins  calculation, which is a validated novel method providing  better accuracy than the Friedewald equation in the  estimation of LDL-C.  Laura Deleon et al. Laura Deleon. 7986;689(80): 2061-2068  (http://education.[facility].com/faq/FAQ164)    HDL  Date Value Ref Range Status  12/14/2023 57 > OR = 50 mg/dL Final  90/90/7983 49 >60 mg/dL Final    Comment:    According to ATP-III Guidelines, HDL-C >59 mg/dL is considered a negative risk factor for CHD.    Triglycerides  Date Value Ref Range Status  12/14/2023 117 <150 mg/dL Final         Passed - AST in normal range and within 360 days    AST  Date Value Ref Range Status  12/14/2023 22 10 - 35 U/L Final         Passed - ALT in normal range and within 360 days    ALT  Date Value Ref Range Status  12/14/2023 20 6 - 29 U/L Final         Passed - Patient is not pregnant      Passed - Valid encounter within last 12 months    Recent Outpatient Visits           6 months ago Essential hypertension   [place] [facility] Leavy Mole, PA-C   7 months ago Essential hypertension    [facility] [place], Mole, PA-C   8 months ago Essential hypertension   [place] [facility] Leavy Mole, PA-C   8 months ago Adult general medical exam   [facility] Leavy Mole, PA-C               atorvastatin  (LIPITOR) 10 MG tablet [Pharmacy Med Name: ATORVASTATIN  10MG  TABLETS] 90 tablet 0    Sig: TAKE 1 TABLET(10 MG) BY MOUTH DAILY     Cardiovascular:  Antilipid - Statins Failed - 09/02/2024  3:47 PM      Failed - Lipid Panel in normal range within the last 12 months    Cholesterol, Total  Date Value Ref Range Status  07/20/2015 245 (H) 100 - 199 mg/dL Final   Cholesterol  Date Value Ref Range Status  12/14/2023 137 <200 mg/dL Final   LDL Cholesterol (Calc)  Date Value Ref Range Status  12/14/2023 60 mg/dL (calc) Final    Comment:    Reference range: <100 . Desirable range <100 mg/dL for primary prevention;   <70 mg/dL for patients with CHD or diabetic patients  with >  or = 2 CHD risk factors. SABRA LDL-C is now calculated using the Martin-Hopkins  calculation, which is a validated novel method providing  better accuracy than the Friedewald equation in the  estimation of LDL-C.  Laura Deleon et al. Laura Deleon. 7986;689(80): 2061-2068  (http://education.[facility].com/faq/FAQ164)    HDL  Date Value Ref Range Status  12/14/2023 57 > OR = 50 mg/dL Final  90/90/7983 49 >60 mg/dL Final    Comment:    According to ATP-III Guidelines, HDL-C >59 mg/dL is considered a negative risk factor for CHD.    Triglycerides  Date Value Ref Range Status  12/14/2023 117 <150 mg/dL Final         Passed - Patient is not pregnant      Passed - Valid encounter within last 12 months    Recent Outpatient Visits           6 months ago Essential hypertension   [place] [facility] Leavy Mole, PA-C   7 months ago Essential hypertension   [facility] Leavy Mole, PA-C   8 months ago Essential hypertension   [facility] Leavy Mole, PA-C   8 months ago Adult general medical exam   [facility] Leavy Mole, PA-C

## 2024-09-02 NOTE — Telephone Encounter (Signed)
 Requested Prescriptions  Pending Prescriptions Disp Refills   metoprolol  succinate (TOPROL -XL) 50 MG 24 hr tablet [Pharmacy Med Name: METOPROLOL  ER SUCCINATE 50MG  TABS] 90 tablet 0    Sig: TAKE 1 TABLET(50 MG) BY MOUTH DAILY AFTER A MEAL     Cardiovascular:  Beta Blockers Failed - 09/02/2024  3:49 PM      Failed - Valid encounter within last 6 months    Recent Outpatient Visits           6 months ago Essential hypertension   [facility] Leavy Mole, PA-C   7 months ago Essential hypertension   [facility] Leavy Mole, PA-C   8 months ago Essential hypertension    [facility] Leavy Mole, PA-C   8 months ago Adult general medical exam   [facility] Leavy Mole, PA-C              Passed - Last BP in normal range    BP Readings from Last 1 Encounters:  02/10/24 138/76         Passed - Last Heart Rate in normal range    Pulse Readings from Last 1 Encounters:  02/10/24 91

## 2024-09-13 ENCOUNTER — Ambulatory Visit: Admitting: Family Medicine

## 2024-09-22 ENCOUNTER — Ambulatory Visit

## 2024-09-22 VITALS — BP 130/62 | Ht 65.0 in | Wt 129.4 lb

## 2024-09-22 DIAGNOSIS — Z23 Encounter for immunization: Secondary | ICD-10-CM

## 2024-09-22 DIAGNOSIS — Z780 Asymptomatic menopausal state: Secondary | ICD-10-CM

## 2024-09-22 DIAGNOSIS — Z0000 Encounter for general adult medical examination without abnormal findings: Secondary | ICD-10-CM

## 2024-09-22 NOTE — Patient Instructions (Addendum)
 Ms. Zawadzki,  Thank you for taking the time for your Medicare Wellness Visit. I appreciate your continued commitment to your health goals. Please review the care plan we discussed, and feel free to reach out if I can assist you further.  Please note that Annual Wellness Visits do not include a physical exam. Some assessments may be limited, especially if the visit was conducted virtually. If needed, we may recommend an in-person follow-up with your provider.  Ongoing Care Seeing your primary care provider every 3 to 6 months helps us  monitor your health and provide consistent, personalized care.   Referrals If a referral was made during today's visit and you haven't received any updates within two weeks, please contact the referred provider directly to check on the status.  REFERRAL FOR BONE DENSITY SCAN You have an order for:  []   2D Mammogram  []   3D Mammogram  [x]   Bone Density     Please call for appointment:  [facility]  [address] [place] [address] [phone] [facility] [address] [place], [place] [address] [phone] [facility] [address] [place], [place] [address] [phone]   Make sure to wear two-piece clothing.  No lotions, powders, or deodorants the day of the appointment. Make sure to bring picture ID and insurance card.  Bring list of medications you are currently taking including any supplements.   Schedule your Tobias screening mammogram through MyChart!   Log into your MyChart account.  Go to 'Visit' (or 'Appointments' if on mobile App) --> Schedule an Appointment  Under 'Select a Reason for Visit' choose the Mammogram Screening option.  Complete the pre-visit questions and select the time and place that best fits your schedule.   Recommended Screenings: FLU SHOT GIVEN  Health Maintenance  Topic Date  Due   Zoster (Shingles) Vaccine (1 of 2) Never done   DEXA scan (bone density measurement)  07/03/2024   Breast Cancer Screening  06/30/2025   Medicare Annual Wellness Visit  09/22/2025   DTaP/Tdap/Td vaccine (4 - Td or Tdap) 08/08/2030   Pneumococcal Vaccine for age over 42  Completed   Flu Shot  Completed   Hepatitis C Screening  Completed   Meningitis B Vaccine  Aged Out   Colon Cancer Screening  Discontinued   COVID-19 Vaccine  Discontinued     Vision: Annual vision screenings are recommended for early detection of glaucoma, cataracts, and diabetic retinopathy. These exams can also reveal signs of chronic conditions such as diabetes and high blood pressure.  Dental: Annual dental screenings help detect early signs of oral cancer, gum disease, and other conditions linked to overall health, including heart disease and diabetes.  Please see the attached documents for additional preventive care recommendations.   NEXT AWV 09/28/25 @ 10:10 AM IN PERSON Take care & I will see ya next year/Oluwadarasimi Redmon

## 2024-09-22 NOTE — Progress Notes (Signed)
 No chief complaint on file.    Subjective:   Laura Deleon is a 78 y.o. female who presents for a Medicare Annual Wellness Visit.  Allergies (verified) Sulfa antibiotics and Latex   History: Past Medical History:  Diagnosis Date   Abdominal wall hernia 07/19/2015   LLQ, uncomplicated   Allergy    Blood glucose elevated 07/19/2015   Closed left hip fracture (HCC) 07/03/2020   Gallstone 12/16/2016   Hernia of anterior abdominal wall    Hip fracture due to osteoporosis with routine healing 03/26/2021   Hyperlipidemia    Hypertension    Osteopenia 09/08/2017   Oct 2018; next scan => Sep 10, 2019   Past Surgical History:  Procedure Laterality Date   ABDOMINAL HYSTERECTOMY     BLADDER SURGERY     HAND SURGERY  11/14/2016   removal of foreign body   HERNIA REPAIR     with mesh   HIP ARTHROPLASTY Left 07/04/2020   Procedure: ARTHROPLASTY BIPOLAR HIP (HEMIARTHROPLASTY);  Surgeon: Tobie Priest, MD;  Location: [facility] ORS;  Service: Orthopedics;  Laterality: Left;   Family History  Problem Relation Age of Onset   Hyperlipidemia Mother    Hypertension Mother    Hyperlipidemia Father    Emphysema Father    COPD Father    Social History   Occupational History   Occupation: Retired  Tobacco Use   Smoking status: Never   Smokeless tobacco: Never   Tobacco comments:    smoking cessation materials not required  Vaping Use   Vaping status: Never Used  Substance and Sexual Activity   Alcohol use: No    Alcohol/week: 0.0 standard drinks of alcohol   Drug use: No   Sexual activity: Not Currently   Tobacco Counseling Counseling given: Not Answered Tobacco comments: smoking cessation materials not required  SDOH Screenings   Food Insecurity: No Food Insecurity (12/14/2023)  Housing: Unknown (12/14/2023)  Transportation Needs: No Transportation Needs (12/14/2023)  Utilities: Not At Risk (12/14/2023)  Alcohol Screen: Low Risk  (06/11/2023)  Depression (PHQ2-9): Low Risk   (12/14/2023)  Financial Resource Strain: Low Risk  (12/14/2023)  Physical Activity: Sufficiently Active (12/14/2023)  Social Connections: Moderately Isolated (12/14/2023)  Stress: No Stress Concern Present (12/14/2023)  Tobacco Use: Low Risk  (09/22/2024)  Health Literacy: Adequate Health Literacy (12/14/2023)   See flowsheets for full screening details  Depression Screen PHQ 2 & 9 Depression Scale- Over the past 2 weeks, how often have you been bothered by any of the following problems? Little interest or pleasure in doing things: 0 Feeling down, depressed, or hopeless (PHQ Adolescent also includes...irritable): 0 PHQ-2 Total Score: 0 Trouble falling or staying asleep, or sleeping too much: 0 Feeling tired or having little energy: 0 Poor appetite or overeating (PHQ Adolescent also includes...weight loss): 0 Feeling bad about yourself - or that you are a failure or have let yourself or your family down: 0 Trouble concentrating on things, such as reading the newspaper or watching television (PHQ Adolescent also includes...like school work): 0 Moving or speaking so slowly that other people could have noticed. Or the opposite - being so fidgety or restless that you have been moving around a lot more than usual: 0 Thoughts that you would be better off dead, or of hurting yourself in some way: 0 PHQ-9 Total Score: 0 If you checked off any problems, how difficult have these problems made it for you to do your work, take care of things at home, or get along  with other people?: Not difficult at all     Goals Addressed   None    Fall Screening Falls in the past year?: 0 Number of falls in past year: 0 Was there an injury with Fall?: 0 Fall Risk Category Calculator: 0 Patient Fall Risk Level: Low Fall Risk  Fall Risk Patient at Risk for Falls Due to: No Fall Risks Fall risk Follow up: Falls prevention discussed        Objective:    Today's Vitals   09/22/24 1010  BP: 130/62  Weight: 129 lb  6.4 oz (58.7 kg)  Height: 5' 5 (1.651 m)   Body mass index is 21.53 kg/m.  Current Medications (verified) Outpatient Encounter Medications as of 09/22/2024  Medication Sig   amLODipine -valsartan  (EXFORGE ) 10-160 MG tablet Take 1 tablet by mouth daily. For high blood pressure   atorvastatin  (LIPITOR) 10 MG tablet TAKE 1 TABLET(10 MG) BY MOUTH DAILY   cholecalciferol (VITAMIN D3) 25 MCG (1000 UNIT) tablet Take 1,000 Units by mouth daily.   ezetimibe  (ZETIA ) 10 MG tablet TAKE 1 TABLET(10 MG) BY MOUTH DAILY   metoprolol  succinate (TOPROL -XL) 50 MG 24 hr tablet TAKE 1 TABLET(50 MG) BY MOUTH DAILY AFTER A MEAL   Multiple Vitamin (MULTI-VITAMINS) TABS Take 1 tablet by mouth daily.   triamcinolone  ointment (KENALOG ) 0.1 % Apply 1 Application topically 2 (two) times daily as needed (area of raised red rash/itchy).   No facility-administered encounter medications on file as of 09/22/2024.   Hearing/Vision screen No results found. Immunizations and Health Maintenance Health Maintenance  Topic Date Due   Zoster Vaccines- Shingrix  (1 of 2) Never done   Influenza Vaccine  06/10/2024   Medicare Annual Wellness (AWV)  06/10/2024   DEXA SCAN  07/03/2024   Mammogram  06/30/2025   DTaP/Tdap/Td (4 - Td or Tdap) 08/08/2030   Pneumococcal Vaccine: 50+ Years  Completed   Hepatitis C Screening  Completed   Meningococcal B Vaccine  Aged Out   Colonoscopy  Discontinued   COVID-19 Vaccine  Discontinued        Assessment/Plan:  This is a routine wellness examination for [place].  Patient Care Team: Leavy Mole, PA-C as PCP - General (Family Medicine) [facility], [facility])  I have personally reviewed and noted the following in the patient's chart:   Medical and social history Use of alcohol, tobacco or illicit drugs  Current medications and supplements including opioid prescriptions. Functional ability and status Nutritional status Physical activity Advanced directives List of  other physicians Hospitalizations, surgeries, and ER visits in previous 12 months Vitals Screenings to include cognitive, depression, and falls Referrals and appointments  No orders of the defined types were placed in this encounter.  In addition, I have reviewed and discussed with patient certain preventive protocols, quality metrics, and best practice recommendations. A written personalized care plan for preventive services as well as general preventive health recommendations were provided to patient.   Jhonnie GORMAN Das, LPN   [id number]   No follow-ups on file.  After Visit Summary: (In Person-Printed) AVS printed and given to the patient  Nurse Notes: FLU SHOT GIVEN- MAMMOGRAM UTD; ORDERED BDS; AGED OUT OF COLONOSCOPY

## 2024-10-09 ENCOUNTER — Other Ambulatory Visit: Payer: Self-pay | Admitting: Nurse Practitioner

## 2024-10-09 DIAGNOSIS — R000 Tachycardia, unspecified: Secondary | ICD-10-CM

## 2024-10-09 DIAGNOSIS — I10 Essential (primary) hypertension: Secondary | ICD-10-CM

## 2024-10-11 NOTE — Telephone Encounter (Signed)
 Copied from CRM #[id number]. Topic: Referral - Question >> Oct 11, 2024 12:33 PM Antony RAMAN wrote: Reason for CRM: needs the bone density referral resent since dr leavy is gone

## 2024-10-13 ENCOUNTER — Emergency Department
Admission: EM | Admit: 2024-10-13 | Discharge: 2024-10-14 | Disposition: A | Attending: Emergency Medicine | Admitting: Emergency Medicine

## 2024-10-13 ENCOUNTER — Emergency Department

## 2024-10-13 ENCOUNTER — Other Ambulatory Visit: Payer: Self-pay

## 2024-10-13 DIAGNOSIS — M25552 Pain in left hip: Secondary | ICD-10-CM | POA: Diagnosis not present

## 2024-10-13 DIAGNOSIS — W01198A Fall on same level from slipping, tripping and stumbling with subsequent striking against other object, initial encounter: Secondary | ICD-10-CM | POA: Insufficient documentation

## 2024-10-13 DIAGNOSIS — I10 Essential (primary) hypertension: Secondary | ICD-10-CM | POA: Insufficient documentation

## 2024-10-13 DIAGNOSIS — M25562 Pain in left knee: Secondary | ICD-10-CM | POA: Diagnosis not present

## 2024-10-13 DIAGNOSIS — S72112A Displaced fracture of greater trochanter of left femur, initial encounter for closed fracture: Secondary | ICD-10-CM | POA: Diagnosis not present

## 2024-10-13 DIAGNOSIS — M4310 Spondylolisthesis, site unspecified: Secondary | ICD-10-CM | POA: Diagnosis not present

## 2024-10-13 DIAGNOSIS — M85862 Other specified disorders of bone density and structure, left lower leg: Secondary | ICD-10-CM | POA: Diagnosis not present

## 2024-10-13 DIAGNOSIS — M47812 Spondylosis without myelopathy or radiculopathy, cervical region: Secondary | ICD-10-CM | POA: Diagnosis not present

## 2024-10-13 DIAGNOSIS — R600 Localized edema: Secondary | ICD-10-CM | POA: Diagnosis not present

## 2024-10-13 DIAGNOSIS — Z043 Encounter for examination and observation following other accident: Secondary | ICD-10-CM | POA: Diagnosis not present

## 2024-10-13 DIAGNOSIS — Z96642 Presence of left artificial hip joint: Secondary | ICD-10-CM | POA: Insufficient documentation

## 2024-10-13 DIAGNOSIS — M11262 Other chondrocalcinosis, left knee: Secondary | ICD-10-CM | POA: Diagnosis not present

## 2024-10-13 DIAGNOSIS — S0990XA Unspecified injury of head, initial encounter: Secondary | ICD-10-CM | POA: Diagnosis not present

## 2024-10-13 DIAGNOSIS — M1712 Unilateral primary osteoarthritis, left knee: Secondary | ICD-10-CM | POA: Diagnosis not present

## 2024-10-13 MED ORDER — OXYCODONE HCL 5 MG PO TABS
5.0000 mg | ORAL_TABLET | Freq: Once | ORAL | Status: DC
  Filled 2024-10-13: qty 1

## 2024-10-13 MED ORDER — LIDOCAINE 5 % EX PTCH
2.0000 | MEDICATED_PATCH | CUTANEOUS | Status: DC
  Administered 2024-10-13: 2 via TRANSDERMAL
  Filled 2024-10-13: qty 2

## 2024-10-13 MED ORDER — OXYCODONE HCL 5 MG PO TABS
5.0000 mg | ORAL_TABLET | ORAL | Status: AC
  Administered 2024-10-13: 5 mg via ORAL
  Filled 2024-10-13: qty 1

## 2024-10-13 MED ORDER — ACETAMINOPHEN 500 MG PO TABS
1000.0000 mg | ORAL_TABLET | Freq: Once | ORAL | Status: AC
  Administered 2024-10-13: 1000 mg via ORAL
  Filled 2024-10-13: qty 2

## 2024-10-13 MED ORDER — FENTANYL CITRATE (PF) 50 MCG/ML IJ SOSY: 50.0000 ug | PREFILLED_SYRINGE | Freq: Once | INTRAMUSCULAR | Status: DC

## 2024-10-13 NOTE — ED Provider Notes (Signed)
 [facility]    Event Date/Time   First MD Initiated Contact with Patient 10/13/24 2015     (approximate)   History   Fall   HPI  Laura Deleon is a 78 y.o. female who presents to the ED for evaluation of Fall   I reviewed PCP visit from April.  History of HTN, anxiety  Patient with history of 2021 left hip hemiarthroplasty presents with left hip pain after mechanical fall.  Reports tripping on a cord at home and striking her left hip on the ground.  Reports also striking her head on the ground, no syncope, no blood thinners.   Physical Exam   Triage Vital Signs: ED Triage Vitals  Encounter Vitals Group     BP 10/13/24 2021 135/61     Girls Systolic BP Percentile --      Girls Diastolic BP Percentile --      Boys Systolic BP Percentile --      Boys Diastolic BP Percentile --      Pulse Rate 10/13/24 2021 81     Resp 10/13/24 2021 16     Temp 10/13/24 2021 98 F (36.7 C)     Temp Source 10/13/24 2021 Oral     SpO2 10/13/24 2021 100 %     Weight --      Height --      Head Circumference --      Peak Flow --      Pain Score 10/13/24 2016 2     Pain Loc --      Pain Education --      Exclude from Growth Chart --     Most recent vital signs: Vitals:   10/13/24 2021  BP: 135/61  Pulse: 81  Resp: 16  Temp: 98 F (36.7 C)  SpO2: 100%    General: Awake, no distress.  CV:  Good peripheral perfusion.  Resp:  Normal effort.  Abd:  No distention.  MSK:  No deformity noted.  Lateral left hip pain with palpation and passive ranging, no evidence of open injury. Neuro:  No focal deficits appreciated. Other:     ED Results / Procedures / Treatments   Labs (all labs ordered are listed, but only abnormal results are displayed) Labs Reviewed - No data to display  EKG   RADIOLOGY Plain film of the pelvis and left hip interpreted by me without evidence of fracture or dislocation Plain film of the left knee interpreted  by me with no evidence of fracture or dislocation. CT head interpreted by me without evidence of acute intracranial pathology CT cervical spine interpreted by me without evidence of fracture or dislocation CT of left hip interpreted by me with fracture over the left greater trochanter  Official radiology report(s): CT Cervical Spine Wo Contrast Result Date: 10/13/2024 EXAM: CT CERVICAL SPINE WITHOUT CONTRAST 10/13/2024 10:11:52 PM TECHNIQUE: CT of the cervical spine was performed without the administration of intravenous contrast. Multiplanar reformatted images are provided for review. Automated exposure control, iterative reconstruction, and/or weight based adjustment of the mA/kV was utilized to reduce the radiation dose to as low as reasonably achievable. COMPARISON: None available. CLINICAL HISTORY: fall FINDINGS: CERVICAL SPINE: BONES AND ALIGNMENT: Degenerative pannus formation about the atlantoaxial joint. Anterolisthesis of C5 that is favored chronic. Small cervical ribs at C7. No acute fracture or traumatic malalignment. DEGENERATIVE CHANGES: Mild multilevel facet arthropathy. No severe spinal canal narrowing. SOFT TISSUES: No prevertebral soft tissue swelling. IMPRESSION:  1. No acute abnormality of the cervical spine. Electronically signed by: Norman Gatlin MD 10/13/2024 10:23 PM EST RP Workstation: [id number]   CT HEAD WO CONTRAST ( ) Result Date: 10/13/2024 EXAM: CT HEAD WITHOUT CONTRAST 10/13/2024 10:11:52 PM TECHNIQUE: CT of the head was performed without the administration of intravenous contrast. Automated exposure control, iterative reconstruction, and/or weight based adjustment of the mA/kV was utilized to reduce the radiation dose to as low as reasonably achievable. COMPARISON: None available. CLINICAL HISTORY: fall FINDINGS: BRAIN AND VENTRICLES: No acute hemorrhage. No evidence of acute infarct. No hydrocephalus. No extra-axial collection. No mass effect or midline shift.  Atherosclerotic calcifications in cavernous internal carotid arteries. ORBITS: No acute abnormality. SINUSES: No acute abnormality. SOFT TISSUES AND SKULL: No acute soft tissue abnormality. No skull fracture. IMPRESSION: 1. No acute intracranial abnormality related to the fall. Electronically signed by: Norman Gatlin MD 10/13/2024 10:20 PM EST RP Workstation: [id number]   CT Hip Left Wo Contrast Result Date: 10/13/2024 EXAM: CT OF THE LEFT HIP WITHOUT IV CONTRAST 10/13/2024 10:11:52 PM TECHNIQUE: CT of the left hip was performed without the administration of intravenous contrast. Multiplanar reformatted images are provided for review. Automated exposure control, iterative reconstruction, and/or weight based adjustment of the mA/kV was utilized to reduce the radiation dose to as low as reasonably achievable. Evaluation is mildly limited by streak artifact. COMPARISON: None available. CLINICAL HISTORY: cannot bear weight, pain laterally over greater troch cannot bear weight, pain laterally over greater troch FINDINGS: BONES: Mildly displaced fracture of the greater trochanter of the left femur with fracture lines possibly extending to the femoral stem prosthesis. Left hip arthroplasty. The femoral head prosthesis is well seated in the acetabulum. No aggressive appearing osseous abnormality or periostitis. SOFT TISSUE: Edema/hemorrhage within the anterior compartment musculature. No soft tissue mass. JOINT: Left hip arthroplasty. The femoral head prosthesis is well seated in the acetabulum. No significant degenerative changes. No osseous erosions. INTRAPELVIC CONTENTS: Partially visualized ventral abdominal wall hernia containing nonobstructed colon and small bowel. IMPRESSION: 1. Mildly displaced fracture of the greater trochanter of the left femur with fracture lines possibly extending to the femoral stem prosthesis. Evaluation is mildly limited by streak artifact. Electronically signed by: Norman Gatlin MD  10/13/2024 10:18 PM EST RP Workstation: [id number]   DG Knee Complete 4 Views Left Result Date: 10/13/2024 CLINICAL DATA:  fall, pain EXAM: LEFT KNEE - COMPLETE 4+ VIEW COMPARISON:  None Available. FINDINGS: Osteopenia.No acute fracture or dislocation. No joint effusion. Mild tricompartmental joint space loss with osteophyte formation. Chondrocalcinosis of the lateral meniscus may also be present. Peripheral vascular atherosclerosis. IMPRESSION: 1. Osteopenia.  No acute fracture or dislocation. 2. Mild tricompartmental osteoarthritis of the knee. Electronically Signed   By: Rogelia Myers M.D.   On: 10/13/2024 22:00   DG HIP UNILAT W OR W/O PELVIS 2-3 VIEWS LEFT Result Date: 10/13/2024 EXAM: 2 or 3 VIEW(S) XRAY OF THE LEFT HIP 10/13/2024 08:52:41 PM COMPARISON: None available. CLINICAL HISTORY: Fall. Left hip pain. FINDINGS: BONES AND JOINTS: Left hip hemiarthroplasty. Evidence of loosening. No acute fracture. No dislocation. No malalignment. SOFT TISSUES: The soft tissues are unremarkable. IMPRESSION: 1. No acute fracture or dislocation. Electronically signed by: Norman Gatlin MD 10/13/2024 08:58 PM EST RP Workstation: [id number]    PROCEDURES and INTERVENTIONS:  Procedures  Medications  lidocaine  (LIDODERM ) 5 % 2 patch (2 patches Transdermal Patch Applied 10/13/24 2217)  fentaNYL  (SUBLIMAZE ) injection 50 mcg (has no administration in time range)  acetaminophen  (TYLENOL ) tablet 1,000 mg (1,000 mg Oral Given  10/13/24 2217)  oxyCODONE  (Oxy IR/ROXICODONE ) immediate release tablet 5 mg (5 mg Oral Given 10/13/24 2253)     IMPRESSION / MDM / ASSESSMENT AND PLAN / ED COURSE  I reviewed the triage vital signs and the nursing notes.  Differential diagnosis includes, but is not limited to, periprosthetic fracture, pelvic ring fracture, muscular strain or spasm  {Patient presents with symptoms of an acute illness or injury that is potentially life-threatening.  Patient presents after mechanical fall  with left lateral hip pain and evidence of a greater trochanteric fracture.  From history of ipsilateral hemiarthroplasty on this left hip but hardware appears intact without any loosening on imaging.  Due to poor weightbearing status obtain CT imaging that confirms greater trochanteric fracture, imaging is otherwise benign, as above.  Consult orthopedics, as below.  Patient here with family and typically independent and lives at home.  She has a desire to attempt an ambulation trial after additional analgesia to help see if she can go home.  If she has poorly controlled pain and unable to bear weight or ambulate I think she would benefit from admission for pain control, PT and rehab placement.  She is agreeable with this plan  Clinical Course as of 10/13/24 2353  Thu Oct 13, 2024  2231 CT noted, ortho paged [DS]  2233 I consult with Dr. Ezra, he reviews images, nothing to do surgically. Bear weight as tolerated, pain control  [DS]    Clinical Course User Index [DS] Claudene Rover, MD     FINAL CLINICAL IMPRESSION(S) / ED DIAGNOSES   Final diagnoses:  Closed displaced fracture of greater trochanter of left femur, initial encounter (HCC)     Rx / DC Orders   ED Discharge Orders     None        Note:  This document was prepared using Dragon voice recognition software and may include unintentional dictation errors.   Claudene Rover, MD 10/13/24 [phone]

## 2024-10-13 NOTE — ED Triage Notes (Signed)
 Pt presents for fall. Endorsing left hip pain. Denies numbness or tingling. Endorsing head strike but denies LOC

## 2024-10-14 MED ORDER — OXYCODONE HCL 5 MG PO TABS: 5.0000 mg | ORAL_TABLET | Freq: Three times a day (TID) | ORAL | 0 refills | Status: DC | PRN

## 2024-10-14 MED ORDER — ONDANSETRON 4 MG PO TBDP: 4.0000 mg | ORAL_TABLET | Freq: Four times a day (QID) | ORAL | 0 refills | Status: DC | PRN

## 2024-10-14 MED ORDER — DOCUSATE SODIUM 100 MG PO CAPS: 100.0000 mg | ORAL_CAPSULE | Freq: Two times a day (BID) | ORAL | 0 refills | Status: AC

## 2024-10-14 NOTE — Discharge Instructions (Addendum)
 You were diagnosed with a fracture of the greater trochanter of the left hip.  Orthopedics was consulted.  Fortunately this is a fracture that does not need surgical intervention and you can weight-bear as tolerated.   You are being provided a prescription for opiates (also known as narcotics) for pain control.  Opiates can be addictive and should only be used when absolutely necessary for pain control when other alternatives do not work.  We recommend you only use them for the recommended amount of time and only as prescribed.  Please do not take with other sedative medications or alcohol.  Please do not drive, operate machinery, make important decisions while taking opiates.  Please note that these medications can be addictive and have high abuse potential.  Patients can become addicted to narcotics after only taking them for a few days.  Please keep these medications locked away from children, teenagers or any family members with history of substance abuse.  Narcotic pain medicine may also make you constipated.  You may use over-the-counter medications such as MiraLAX , Colace to prevent constipation.  If you become constipated, you may use over-the-counter enemas as needed.  Itching and nausea are also common side effects of narcotic pain medication.  If you develop uncontrolled vomiting or a rash, please stop these medications and seek medical care.

## 2024-10-14 NOTE — ED Provider Notes (Signed)
 12:00 AM  Assumed care at shift change.  Patient here with greater trochanter fracture.  Orthopedics has been consulted and recommended discharge home, weight-bear as tolerated with pain medication.  Patient feeling better after oxycodone  here and able to ambulate.  Will discharge with prescriptions for pain medication and give outpatient follow-up.   Alfons Sulkowski, Josette SAILOR, DO 10/14/24 [phone]

## 2024-11-04 ENCOUNTER — Ambulatory Visit: Admitting: Family Medicine

## 2024-11-04 ENCOUNTER — Emergency Department

## 2024-11-04 ENCOUNTER — Other Ambulatory Visit: Payer: Self-pay

## 2024-11-04 ENCOUNTER — Encounter: Payer: Self-pay | Admitting: Family Medicine

## 2024-11-04 ENCOUNTER — Emergency Department
Admission: EM | Admit: 2024-11-04 | Discharge: 2024-11-04 | Disposition: A | Discharge status: Home / Self Care | Attending: Emergency Medicine | Admitting: Emergency Medicine

## 2024-11-04 ENCOUNTER — Encounter: Payer: Self-pay | Admitting: Radiology

## 2024-11-04 VITALS — BP 130/76 | HR 94 | Resp 16 | Ht 65.0 in | Wt 121.0 lb

## 2024-11-04 DIAGNOSIS — I10 Essential (primary) hypertension: Secondary | ICD-10-CM | POA: Insufficient documentation

## 2024-11-04 DIAGNOSIS — M25552 Pain in left hip: Secondary | ICD-10-CM | POA: Diagnosis not present

## 2024-11-04 DIAGNOSIS — R790 Abnormal level of blood mineral: Secondary | ICD-10-CM | POA: Insufficient documentation

## 2024-11-04 DIAGNOSIS — R600 Localized edema: Secondary | ICD-10-CM | POA: Insufficient documentation

## 2024-11-04 DIAGNOSIS — M7989 Other specified soft tissue disorders: Secondary | ICD-10-CM | POA: Insufficient documentation

## 2024-11-04 DIAGNOSIS — L03116 Cellulitis of left lower limb: Secondary | ICD-10-CM

## 2024-11-04 DIAGNOSIS — K8020 Calculus of gallbladder without cholecystitis without obstruction: Secondary | ICD-10-CM

## 2024-11-04 LAB — COMPREHENSIVE METABOLIC PANEL WITH GFR
ALT: 13 U/L (ref 0–44)
AST: 21 U/L (ref 15–41)
Albumin: 4.4 g/dL (ref 3.5–5.0)
Alkaline Phosphatase: 164 U/L — ABNORMAL HIGH (ref 38–126)
Anion gap: 13 (ref 5–15)
BUN: 24 mg/dL — ABNORMAL HIGH (ref 8–23)
CO2: 25 mmol/L (ref 22–32)
Calcium: 9.6 mg/dL (ref 8.9–10.3)
Chloride: 99 mmol/L (ref 98–111)
Creatinine, Ser: 0.72 mg/dL (ref 0.44–1.00)
GFR, Estimated: >60 mL/min
Glucose, Bld: 130 mg/dL — ABNORMAL HIGH (ref 70–99)
Potassium: 4.1 mmol/L (ref 3.5–5.1)
Sodium: 136 mmol/L (ref 135–145)
Total Bilirubin: 0.5 mg/dL (ref 0.0–1.2)
Total Protein: 7.7 g/dL (ref 6.5–8.1)

## 2024-11-04 LAB — CBC WITH DIFFERENTIAL/PLATELET
Abs Immature Granulocytes: 0.02 K/uL (ref 0.00–0.07)
Basophils Absolute: 0.1 K/uL (ref 0.0–0.1)
Basophils Relative: 1 %
Eosinophils Absolute: 0.1 K/uL (ref 0.0–0.5)
Eosinophils Relative: 1 %
HCT: 38.7 % (ref 36.0–46.0)
Hemoglobin: 13 g/dL (ref 12.0–15.0)
Immature Granulocytes: 0 %
Lymphocytes Relative: 22 %
Lymphs Abs: 2.2 K/uL (ref 0.7–4.0)
MCH: 28.8 pg (ref 26.0–34.0)
MCHC: 33.6 g/dL (ref 30.0–36.0)
MCV: 85.6 fL (ref 80.0–100.0)
Monocytes Absolute: 0.6 K/uL (ref 0.1–1.0)
Monocytes Relative: 6 %
Neutro Abs: 6.7 K/uL (ref 1.7–7.7)
Neutrophils Relative %: 70 %
Platelets: 307 K/uL (ref 150–400)
RBC: 4.52 MIL/uL (ref 3.87–5.11)
RDW: 13.3 % (ref 11.5–15.5)
WBC: 9.7 K/uL (ref 4.0–10.5)
nRBC: 0 % (ref 0.0–0.2)

## 2024-11-04 LAB — D-DIMER, QUANTITATIVE: D-Dimer, Quant: 1.67 ug{FEU}/mL — ABNORMAL HIGH (ref 0.00–0.50)

## 2024-11-04 MED ORDER — CEPHALEXIN 500 MG PO CAPS: 500.0000 mg | ORAL_CAPSULE | Freq: Four times a day (QID) | ORAL | 0 refills | Status: AC

## 2024-11-04 MED ORDER — AMLODIPINE BESYLATE-VALSARTAN 10-160 MG PO TABS: 1.0000 | ORAL_TABLET | Freq: Every day | ORAL | 0 refills | Status: AC

## 2024-11-04 MED ORDER — IOHEXOL 350 MG/ML SOLN
75.0000 mL | Freq: Once | INTRAVENOUS | Status: AC | PRN
  Administered 2024-11-04: 75 mL via INTRAVENOUS

## 2024-11-04 MED ORDER — CEPHALEXIN 500 MG PO CAPS
500.0000 mg | ORAL_CAPSULE | Freq: Once | ORAL | Status: AC
  Administered 2024-11-04: 500 mg via ORAL
  Filled 2024-11-04: qty 1

## 2024-11-04 NOTE — Discharge Instructions (Addendum)
 You have been seen today in the Emergency Department (ED) for cellulitis, a superficial skin infection. Please take your antibiotics as prescribed for their ENTIRE prescribed duration.  Take Tylenol  or Motrin as needed for pain, but only as written on the box.   Please follow up with your doctor or in the ED in 24-48 hours for recheck of your infection if you are not improving.  Call your doctor sooner or return to the ED if you develop worsening signs of infection such as: increased redness, increased pain, pus, fever, or other symptoms that concern you.   You also have gallstones from your gallbladder.  Your primary care provider no you were seen today had an incidental finding of gallstones.  They are not concerning unless this causes you fever, chills, vomiting or abdominal pain.  Return to the emergency department if you experience any of the symptoms.

## 2024-11-04 NOTE — ED Triage Notes (Signed)
 On Dec 4th she had a fall and fractured her left hip. Today she was sent by her PCP for probably blood clot to her left lower leg. Her shin is red and warm.

## 2024-11-04 NOTE — ED Provider Notes (Signed)
" °  Physical Exam  BP 127/68 (BP Location: Left Arm)   Pulse 96   Temp 98.2 F (36.8 C) (Oral)   Resp 16   Ht 5' 5 (1.651 m)   Wt 54.9 kg   SpO2 94%   BMI 20.14 kg/m   Physical Exam  Procedures  Procedures  ED Course / MDM   Clinical Course as of 11/05/24 0013  Fri Nov 04, 2024  2118 Care of the patient will be passed onto the oncoming provider pending results of CT angio chest for PE.  If negative, plan would be to treat patient for left lower leg cellulitis with antibiotics. [LD]    Clinical Course User Index [LD] Cleaster Tinnie LABOR, PA-C   Medical Decision Making Amount and/or Complexity of Data Reviewed Labs: ordered. Radiology: ordered.  Risk Prescription drug management.   Assuming care from Tinnie Cleaster, PA-C. In short, patient sent by her primary care provider for DVT r/o with symptoms of cellulitis of the left lower leg.  Ultrasound left lower extremity was negative for DVT however given elevated D-dimer, did order CT angio to rule out PE. Pending CT angio results.  CT angio chest PE results  IMPRESSION: 1. No pulmonary embolism. 2. No acute pulmonary findings. 3. Cholelithiasis. Incompletely evaluated gallbladder that is partially visualized.  CT angio chest is negative for pulmonary embolism.  Will treat for cellulitis at this time with Keflex .  She was given a dose here and will provide prescription as well.  Also discussed gallstones captured on CT angio.  She can follow-up with her primary care provider following today's visit for this incidental finding.  Did strongly encourage her to follow-up with her PCP or in the emergency department if she is 24 hours with antibiotics with worsening symptoms of infection.  The patient may return to the emergency department for any new, worsening, or concerning symptoms. Patient was given the opportunity to ask questions; all questions were answered. Emergency department return precautions were discussed with the  patient.  Patient is in agreement to the treatment plan.  Patient is stable for discharge.     Sheron Salm, PA-C 11/05/24 9983    Floy Roberts, MD 11/05/24 [phone]  "

## 2024-11-04 NOTE — Assessment & Plan Note (Addendum)
-  HTN adequately managed; BP at goal at today's visit -Goal BP of less than 140/90 -Refill Amlodipine -Valsartan  10-160mg  with instructions to take one tablet once daily -Follow up in 2 weeks for chronic care management/ chronic condition follow up Orders:   amLODipine -valsartan  (EXFORGE ) 10-160 MG tablet; Take 1 tablet by mouth daily. For high blood pressure

## 2024-11-04 NOTE — ED Provider Notes (Signed)
 "  [facility] Provider Note    Event Date/Time   First MD Initiated Contact with Patient 11/04/24 1813     (approximate)   History   ? blood clot   HPI  Laura Deleon is a 78 y.o. female with PMH of hypertension and fall at the beginning of the month resulting in a closed left hip fracture who presents for evaluation of possible blood clot in the left lower extremity.  Patient was seen by her PCP earlier today for follow-up appointment and PCP was concerned for DVT given the left lower extremity edema and erythema and advised to come to the ED.      Physical Exam   Triage Vital Signs: ED Triage Vitals  Encounter Vitals Group     BP 11/04/24 1625 139/71     Girls Systolic BP Percentile --      Girls Diastolic BP Percentile --      Boys Systolic BP Percentile --      Boys Diastolic BP Percentile --      Pulse Rate 11/04/24 1625 (!) 101     Resp 11/04/24 1625 17     Temp 11/04/24 1625 98.2 F (36.8 C)     Temp Source 11/04/24 1625 Oral     SpO2 11/04/24 1625 94 %     Weight 11/04/24 1621 121 lb (54.9 kg)     Height 11/04/24 1621 5' 5 (1.651 m)     Head Circumference --      Peak Flow --      Pain Score --      Pain Loc --      Pain Education --      Exclude from Growth Chart --     Most recent vital signs: Vitals:   11/04/24 1625  BP: 139/71  Pulse: (!) 101  Resp: 17  Temp: 98.2 F (36.8 C)  SpO2: 94%   General: Awake, no distress.  CV:  Good peripheral perfusion.  RRR. Resp:  Normal effort.  CTAB. Abd:  No distention.  Other:  Mild swelling noted to the left lower extremity with erythema and warmth on the left shin, calf is tender to compression, dorsalis pedis pulse 2+ and regular   ED Results / Procedures / Treatments   Labs (all labs ordered are listed, but only abnormal results are displayed) Labs Reviewed  COMPREHENSIVE METABOLIC PANEL WITH GFR - Abnormal; Notable for the following components:      Result Value    Glucose, Bld 130 (*)    BUN 24 (*)    Alkaline Phosphatase 164 (*)    All other components within normal limits  D-DIMER, QUANTITATIVE - Abnormal; Notable for the following components:   D-Dimer, Quant 1.67 (*)    All other components within normal limits  CBC WITH DIFFERENTIAL/PLATELET     RADIOLOGY  Left lower extremity ultrasound and CTA of chest for PE was obtained.  I interpreted the images as well as reviewed the radiologist report which was negative for DVT.  CTA of the chest is still pending.  PROCEDURES:  Critical Care performed: No  Procedures   MEDICATIONS ORDERED IN ED: Medications - No data to display   IMPRESSION / MDM / ASSESSMENT AND PLAN / ED COURSE  I reviewed the triage vital signs and the nursing notes.  78 year old female presents for evaluation of possible blood clot.  Vital signs are stable patient NAD on exam.  Differential diagnosis includes, but is not limited to, DVT, PE, cellulitis.  Patient's presentation is most consistent with acute complicated illness / injury requiring diagnostic workup.  CBC and CMP are unremarkable.  D-dimer is elevated.  On exam patient has mild edema to the left lower extremity with overlying erythema and warmth that appears consistent with cellulitis.  Given patient's recent fracture do feel that DVT rule out is pertinent.  Will obtain left lower extremity ultrasound as well as a CT PE given the D-dimer is elevated.  Left lower extremity ultrasound is negative for DVT.  Clinical Course as of 11/04/24 2119  Fri Nov 04, 2024  2118 Care of the patient will be passed onto the oncoming provider pending results of CT angio chest for PE.  If negative, plan would be to treat patient for left lower leg cellulitis with antibiotics. [LD]    Clinical Course User Index [LD] Cleaster Tinnie LABOR, PA-C     FINAL CLINICAL IMPRESSION(S) / ED DIAGNOSES   Final diagnoses:  None     Rx / DC Orders    ED Discharge Orders     None        Note:  This document was prepared using Dragon voice recognition software and may include unintentional dictation errors.   Cleaster Tinnie LABOR, PA-C 11/04/24 2123    Dorothyann Drivers, MD 11/05/24 2241  "

## 2024-11-04 NOTE — ED Provider Notes (Signed)
----------------------------------------- °  6:38 PM on 11/04/2024 ----------------------------------------- I have personally seen and evaluated the patient.  Patient has some redness in her left lower extremity with a positive D-dimer sent by her PCP.  Patient also found to be slightly tachycardic with heart rate around 101.  Patient's D-dimer is elevated here to 1.6.  Given her left leg redness and recent hip injury (greater trochanter fracture) we will obtain a left lower extremity ultrasound.  Given her slight tachycardia although denies any chest pain or shortness of breath we will obtain a CTA of the chest as a precaution to rule out PE.  Patient agreeable to plan.   Dorothyann Drivers, MD 11/05/24 2251

## 2024-11-04 NOTE — Progress Notes (Signed)
 "  Established Patient Office Visit  Subjective   Patient ID: Laura Deleon, female    DOB: Nov 12, 1945  Age: 78 y.o. MRN: [id number]  Chief Complaint  Patient presents with   Hospitalization Follow-up    HPI  Patient is a pleasant 78 year old female who is seen today for hospital follow up. She is a new patient to me. She was most recently seen at [facility] Emergency Department on 10/13/24. She presented to the ED due to fall and left hip pain. She has hx of left hip hemiarthroplasty in 2021. CT of the left hip showed a mildly displaced fracture of the greater trochanter of the left femur. Ortho consulted during time of ED presentation and noted that she was ok to discharge home with outpatient follow up as no surgical intervention was needed. She was advised that she may  bear weight as tolerated. She currently uses a cane. She voices today that she is pleased surgery was not needed. She voices she has not seen ortho since ED discharge nor does she have an appointment scheduled. She was given Oxycodone  at time of discharge, and today shows that she has several medication tablets left. She voices left hip pain has improved and she does not require use of Oxycodone  prescription.   She voices new concern of left lower leg redness and swelling. She voices she noticed this approximately 5 days ago. She states she has been using cool compresses and wearing compression socks and voices this seems to have helped. She states left leg pain seems to be better but the area remains red, swollen and warm to touch. She denies numbness or tingling of the LLE. She reports RLE is not swollen. She denies chest pain or shortness of breath.    She also voices she needs a refill on her blood pressure medication.   Past Surgical History:  Procedure Laterality Date   ABDOMINAL HYSTERECTOMY     BLADDER SURGERY     HAND SURGERY  11/14/2016   removal of foreign body   HERNIA REPAIR      with mesh   HIP ARTHROPLASTY Left 07/04/2020   Procedure: ARTHROPLASTY BIPOLAR HIP (HEMIARTHROPLASTY);  Surgeon: Tobie Priest, MD;  Location: [facility] ORS;  Service: Orthopedics;  Laterality: Left;     Review of Systems  Respiratory:  Negative for shortness of breath.   Cardiovascular:  Positive for leg swelling. Negative for chest pain.  Musculoskeletal:  Positive for falls and joint pain.      Objective:     BP 130/76   Pulse 94   Resp 16   Ht 5' 5 (1.651 m)   Wt 121 lb (54.9 kg)   SpO2 97%   BMI 20.14 kg/m    Physical Exam Constitutional:      General: She is not in acute distress.    Appearance: Normal appearance.  HENT:     Head: Normocephalic and atraumatic.  Cardiovascular:     Rate and Rhythm: Normal rate and regular rhythm.     Heart sounds: Normal heart sounds.  Pulmonary:     Effort: Pulmonary effort is normal. No respiratory distress.     Breath sounds: Normal breath sounds.  Musculoskeletal:     Right lower leg: No edema.     Left lower leg: Edema present.     Comments: Left lower extremity edema, erythema and increased warmth  Skin:    General: Skin is warm and dry.  Neurological:  General: No focal deficit present.     Mental Status: She is alert.  Psychiatric:        Mood and Affect: Mood normal.        Behavior: Behavior normal.     Last metabolic panel Lab Results  Component Value Date   GLUCOSE 128 (H) 12/14/2023   NA 137 12/14/2023   K 4.5 12/14/2023   CL 99 12/14/2023   CO2 27 12/14/2023   BUN 19 12/14/2023   CREATININE 0.58 (L) 12/14/2023   EGFR 94 12/14/2023   CALCIUM  10.2 12/14/2023   PROT 7.9 12/14/2023   ALBUMIN 4.1 07/03/2020   LABGLOB 2.9 07/20/2015   AGRATIO 1.4 07/20/2015   BILITOT 0.4 12/14/2023   ALKPHOS 56 07/03/2020   AST 22 12/14/2023   ALT 20 12/14/2023   ANIONGAP 3 (L) 07/06/2020        Assessment & Plan:   Assessment & Plan Left leg swelling Patient is a 78 year old female who presents today for ED  follow up for left hip fracture and left hip pain following a fall, however voices new concern of left lower extremity erythema and edema. Reports LLE edema and erythema onset of 5 days ago.   LLE with non-pitting edema with associated erythema and increased warmth.   -Advised patient that presentation is concerning for DVT especially with recent fracture hx and decreased mobility.  -Patient advised to present to the emergency department immediately after leaving our office today to rule out DVT. She voices understanding and is agreeable.  -Close follow up in 2 weeks     Left hip pain Patient initially presenting for ED follow up where she was seen for left hip pain following a fall on 10/13/24 where CT imaging completed showing a fracture. Patient with hx of orthopedic surgery of left hip.   -Advised ortho follow up to ensure routine healing of left hip fracture. She is agreeable.  -Ortho referral placed today -Close follow up in 2 weeks as previously noted  Orders:   Ambulatory referral to Orthopedic Surgery  Essential hypertension -HTN adequately managed; BP at goal at today's visit -Goal BP of less than 140/90 -Refill Amlodipine -Valsartan  10-160mg  with instructions to take one tablet once daily -Follow up in 2 weeks for chronic care management/ chronic condition follow up Orders:   amLODipine -valsartan  (EXFORGE ) 10-160 MG tablet; Take 1 tablet by mouth daily. For high blood pressure      Return in about 2 weeks (around 11/18/2024).    LAYMON LOISE CORE, FNP "

## 2024-11-08 ENCOUNTER — Ambulatory Visit: Payer: Self-pay

## 2024-11-08 NOTE — Telephone Encounter (Signed)
 FYI Only or Action Required?: Action required by provider: clinical question for provider.  Patient was last seen in primary care on 11/04/2024 by Laura Deleon, Laura SAILOR, FNP.  Called Nurse Triage reporting Diarrhea.  Symptoms began yesterday.  Interventions attempted: Nothing.  Symptoms are: stable.  Triage Disposition: See Physician Within 24 Hours  Patient/caregiver understands and will follow disposition?: Yes Reason for Disposition  MODERATE diarrhea (e.g., 4-6 times / day more than normal)  Answer Assessment - Initial Assessment Questions No available appointments within dispo. Patient would like to speak to [name] on what to do about this abx and if she should keep taking it for cellulites.  Please advise.  1. ANTIBIOTIC: What antibiotic are you taking? How many times per day?     cephALEXin  (KEFLEX ) 500 MG capsule 4x daily  2. ANTIBIOTIC ONSET: When was the antibiotic started?     11/05/24  3. DIARRHEA SEVERITY: How bad is the diarrhea? How many more stools have you had in the past 24 hours than normal?      5-8 times   4. ONSET: When did the diarrhea begin?      11/07/24, morning  5. BM CONSISTENCY: How loose or watery is the diarrhea?      Loose, not watery  6. VOMITING: Are you also vomiting? If Yes, ask: How many times in the past 24 hours?      Denies  7. ABDOMEN PAIN: Are you having any abdomen pain? If Yes, ask: What does it feel like? (e.g., crampy, dull, intermittent, constant)      Denies  8. ABDOMEN PAIN SEVERITY: If present, ask: How bad is the pain?  (e.g., Scale 1-10; mild, moderate, or severe)     N/A  9. ORAL INTAKE: If vomiting, Have you been able to drink liquids? How much liquids have you had in the past 24 hours?     Drinking water  10. HYDRATION: Any signs of dehydration? (e.g., dry mouth [not just dry lips], too weak to stand, dizziness, new weight loss) When did you last urinate?       Denies  11. OTHER  SYMPTOMS: Do you have any other symptoms? (e.g., fever, blood in stool)       Denies  Protocols used: Diarrhea on Antibiotics-A-AH  Copied from CRM #[id number]. Topic: Clinical - Red Word Triage >> Nov 08, 2024 11:21 AM Zebedee SAUNDERS wrote: Red Word that prompted transfer to Nurse Triage: Pt is taking antibiotics cephalexin  500 mg and now she has diarrhea since yesterday.

## 2024-11-08 NOTE — Telephone Encounter (Signed)
"  PT notified  "

## 2024-11-18 ENCOUNTER — Encounter: Payer: Self-pay | Admitting: Family Medicine

## 2024-11-18 ENCOUNTER — Ambulatory Visit: Admitting: Family Medicine

## 2024-11-18 VITALS — BP 126/74 | HR 87 | Resp 16 | Ht 65.0 in | Wt 121.0 lb

## 2024-11-18 DIAGNOSIS — L03116 Cellulitis of left lower limb: Secondary | ICD-10-CM | POA: Diagnosis not present

## 2024-11-18 DIAGNOSIS — I83893 Varicose veins of bilateral lower extremities with other complications: Secondary | ICD-10-CM | POA: Diagnosis not present

## 2024-11-18 DIAGNOSIS — E7800 Pure hypercholesterolemia, unspecified: Secondary | ICD-10-CM | POA: Diagnosis not present

## 2024-11-18 DIAGNOSIS — M810 Age-related osteoporosis without current pathological fracture: Secondary | ICD-10-CM

## 2024-11-18 DIAGNOSIS — R7303 Prediabetes: Secondary | ICD-10-CM | POA: Diagnosis not present

## 2024-11-18 DIAGNOSIS — I10 Essential (primary) hypertension: Secondary | ICD-10-CM

## 2024-11-18 LAB — CBC WITH DIFFERENTIAL/PLATELET
Absolute Lymphocytes: 2887 {cells}/uL (ref 850–3900)
Absolute Monocytes: 704 {cells}/uL (ref 200–950)
Basophils Absolute: 82 {cells}/uL (ref 0–200)
Basophils Relative: 0.8 %
Eosinophils Absolute: 82 {cells}/uL (ref 15–500)
Eosinophils Relative: 0.8 %
HCT: 41.4 % (ref 35.9–46.0)
Hemoglobin: 13.7 g/dL (ref 11.7–15.5)
MCH: 28.5 pg (ref 27.0–33.0)
MCHC: 33.1 g/dL (ref 31.6–35.4)
MCV: 86.1 fL (ref 81.4–101.7)
MPV: 10.2 fL (ref 7.5–12.5)
Monocytes Relative: 6.9 %
Neutro Abs: 6446 {cells}/uL (ref 1500–7800)
Neutrophils Relative %: 63.2 %
Platelets: 232 Thousand/uL (ref 140–400)
RBC: 4.81 Million/uL (ref 3.80–5.10)
RDW: 12.8 % (ref 11.0–15.0)
Total Lymphocyte: 28.3 %
WBC: 10.2 Thousand/uL (ref 3.8–10.8)

## 2024-11-18 LAB — HEMOGLOBIN A1C
Hgb A1c MFr Bld: 5.5 %
Mean Plasma Glucose: 111 mg/dL
eAG (mmol/L): 6.2 mmol/L

## 2024-11-18 MED ORDER — EZETIMIBE 10 MG PO TABS: ORAL_TABLET | ORAL | 1 refills | Status: AC

## 2024-11-18 MED ORDER — ATORVASTATIN CALCIUM 10 MG PO TABS: 10.0000 mg | ORAL_TABLET | Freq: Every day | ORAL | 0 refills | Status: AC

## 2024-11-18 MED ORDER — METOPROLOL SUCCINATE ER 50 MG PO TB24: 50.0000 mg | ORAL_TABLET | Freq: Every day | ORAL | 0 refills | Status: AC

## 2024-11-18 NOTE — Assessment & Plan Note (Addendum)
 HTN controlled/ stable. BP of 126/74 at time of visit today.  HR and rhythm normal. V/s stable.   -Refill Metoprolol  Succinate (Toprol  XL) 50mg  once daily -Update labs  Orders:   Comprehensive Metabolic Panel (CMET)   Lipid Profile   metoprolol  succinate (TOPROL -XL) 50 MG 24 hr tablet; Take 1 tablet (50 mg total) by mouth daily. Take with or immediately following a meal.

## 2024-11-18 NOTE — Patient Instructions (Addendum)
 Referral has been sent to:   JALENE [facility]P SQUIBB: [phone] F: [phone]   Release ID # [id number]   This was referral was for your hip follow up   Referral was also sent to:   [facility]   Address: [address], [place], [place] [address] Phone: [phone]  This referral is for your leg swelling and varicose veins

## 2024-11-18 NOTE — Progress Notes (Signed)
 "  Established Patient Office Visit  Subjective   Patient ID: Laura Deleon, female    DOB: 23-Dec-1945  Age: 79 y.o. MRN: [id number]  Chief Complaint  Patient presents with   Follow-up    2 weeks, leg feeling a lot better    HPI Patient is a pleasant 79 year old female who returns for two week follow up. She voices she is feeling well today without concerns or complaints.  Last seen on 11/04/24 and referred to the ER due to LLE swelling and erythema, initially concerned for DVT due to recent left hip fracture and decrease in activity/mobility. She states today that her leg feels much better. She reports she did complete antibiotic prescription, Cephalexin , prescribed at time of ED discharge for treatment of left lower extremity cellulitis. She had called with concerns of diarrhea during antibiotic therapy, but states today that diarrhea has resolved.   She does admit that she has not seen ortho yet nor has she scheduled her appointment. Ortho referral placed at her last visit, 11/04/24, due to left hip fracture seen on imaging from prior ED visit on 10/13/24. Advised that she still needs to see ortho for follow up on left hip fracture. Referral/ contact information provided on AVS.     Review of Systems  Constitutional:  Negative for chills and fever.  Respiratory:  Negative for shortness of breath.   Cardiovascular:  Positive for leg swelling. Negative for chest pain.  Neurological:  Negative for dizziness.      Objective:     BP 126/74   Pulse 87   Resp 16   Ht 5' 5 (1.651 m)   Wt 121 lb (54.9 kg)   SpO2 99%   BMI 20.14 kg/m  BP Readings from Last 3 Encounters:  11/18/24 126/74  11/04/24 127/68  11/04/24 130/76   Wt Readings from Last 3 Encounters:  11/18/24 121 lb (54.9 kg)  11/04/24 121 lb (54.9 kg)  11/04/24 121 lb (54.9 kg)      Physical Exam Constitutional:      General: She is not in acute distress.    Appearance: Normal appearance. She is not  ill-appearing or toxic-appearing.  Cardiovascular:     Rate and Rhythm: Normal rate and regular rhythm.  Pulmonary:     Effort: Pulmonary effort is normal. No respiratory distress.     Breath sounds: Normal breath sounds. No wheezing, rhonchi or rales.  Musculoskeletal:     Right lower leg: 1+ Pitting Edema present.     Left lower leg: 2+ Pitting Edema present.  Skin:    General: Skin is warm and dry.     Comments: Minimal erythema of bilateral lower extremities noted on exam in absence of increased warmth to palpation. Skin temperature appropriate.   Neurological:     General: No focal deficit present.     Mental Status: She is alert.  Psychiatric:        Mood and Affect: Mood normal.        Behavior: Behavior normal.     Last CBC Lab Results  Component Value Date   WBC 9.7 11/04/2024   HGB 13.0 11/04/2024   HCT 38.7 11/04/2024   MCV 85.6 11/04/2024   MCH 28.8 11/04/2024   RDW 13.3 11/04/2024   PLT 307 11/04/2024   Last metabolic panel Lab Results  Component Value Date   GLUCOSE 130 (H) 11/04/2024   NA 136 11/04/2024   K 4.1 11/04/2024   CL 99 11/04/2024  CO2 25 11/04/2024   BUN 24 (H) 11/04/2024   CREATININE 0.72 11/04/2024   GFRNONAA >60 11/04/2024   CALCIUM  9.6 11/04/2024   PROT 7.7 11/04/2024   ALBUMIN 4.4 11/04/2024   LABGLOB 2.9 07/20/2015   AGRATIO 1.4 07/20/2015   BILITOT 0.5 11/04/2024   ALKPHOS 164 (H) 11/04/2024   AST 21 11/04/2024   ALT 13 11/04/2024   ANIONGAP 13 11/04/2024   Last lipids Lab Results  Component Value Date   CHOL 137 12/14/2023   HDL 57 12/14/2023   LDLCALC 60 12/14/2023   TRIG 117 12/14/2023   CHOLHDL 2.4 12/14/2023   Last hemoglobin A1c Lab Results  Component Value Date   HGBA1C 5.9 (H) 12/14/2023   Last vitamin D  Lab Results  Component Value Date   VD25OH 55 05/01/2022          Assessment & Plan:   Assessment & Plan Left leg cellulitis Left leg cellulitis diagnosed at time of ED visit on 11/04/24.  Ultrasound of LLE negative for DVT on 11/04/24. LLE is still edematous but significant improvement in erythema and this is in absence of increased warmth to palpation. She reports she completed Cephalexin  as prescribed. LLE cellulitis considered resolved at time of visit today. Will note RLE with similar appearing erythema though also without increased warmth to palpation.   Suspect that varicose veins of bilateral lower extremities could contribute to bilateral lower extremity edema and erythema. Referred to vein specialist, see below.   -Repeat CBC, labs ordered -Strict return precautions advised including increasing swelling, worsening erythema or other skin color changes, increased warmth of lower extremities, or new worrisome symptoms. She voices understanding.  Orders:   CBC w/Diff/Platelet  Varicose veins of bilateral lower extremities with other complications Varicose veins of bilateral lower extremities with associated swelling. Note erythema of bilateral lower extremities.  LLE cellulitis previously diagnosed on 11/04/24 after LLE ultrasound negative for DVT. LLE cellulitis assumed to be resolved, as noted above there is a significant improvement in appearance and she reports she completed prescribed antibiotic regimen.   -Referral to vein and vascular for further evaluation/ consult  -Return precautions strictly advised  Orders:   Ambulatory referral to Vascular Surgery  Essential hypertension HTN controlled/ stable. BP of 126/74 at time of visit today.  HR and rhythm normal. V/s stable.   -Refill Metoprolol  Succinate (Toprol  XL) 50mg  once daily -Update labs  Orders:   Comprehensive Metabolic Panel (CMET)   Lipid Profile   metoprolol  succinate (TOPROL -XL) 50 MG 24 hr tablet; Take 1 tablet (50 mg total) by mouth daily. Take with or immediately following a meal.  Pure hypercholesterolemia Hypercholesterolemia controlled. Last LDL 60 on 12/14/23.  -Update labs -Continue Zetia   10mg  once daily, refill provided -Continue Atorvastatin  10mg  once daily, refill provided  Orders:   Comprehensive Metabolic Panel (CMET)   Lipid Profile   atorvastatin  (LIPITOR) 10 MG tablet; Take 1 tablet (10 mg total) by mouth at bedtime.   ezetimibe  (ZETIA ) 10 MG tablet; TAKE 1 TABLET(10 MG) BY MOUTH DAILY  Prediabetes Prediabetes with A1c of 5.9. Prediabetes managed with lifestyle.  -Continue with dietary changes, decreasing sugars and starchy carbs -Update labs   Orders:   Comprehensive Metabolic Panel (CMET)   HgB A1c  Osteoporosis, unspecified osteoporosis type, unspecified pathological fracture presence DEXA not UTD, DEXA last completed 06/2022. DEXA results consistent with osteoporosis diagnosis.  Not currently on medication.   -Update bone density scan, DEXA ordered  Orders:   DG Bone Density; Future  Return in about 3 months (around 02/16/2025).    LAYMON LOISE CORE, FNP "

## 2024-11-21 ENCOUNTER — Ambulatory Visit: Payer: Self-pay | Admitting: Family Medicine

## 2024-12-13 ENCOUNTER — Other Ambulatory Visit

## 2024-12-28 ENCOUNTER — Encounter (INDEPENDENT_AMBULATORY_CARE_PROVIDER_SITE_OTHER): Payer: Self-pay

## 2024-12-28 ENCOUNTER — Encounter (INDEPENDENT_AMBULATORY_CARE_PROVIDER_SITE_OTHER): Payer: Self-pay | Admitting: Nurse Practitioner

## 2025-02-16 ENCOUNTER — Ambulatory Visit: Admitting: Family Medicine

## 2025-09-28 ENCOUNTER — Ambulatory Visit
